# Patient Record
Sex: Male | Born: 1950 | Race: White | Hispanic: No | Marital: Married | Smoking: Never smoker
Health system: Southern US, Community
[De-identification: ages and names within clinical notes are randomized; demographics above are authoritative.]

## PROBLEM LIST (undated history)

## (undated) DIAGNOSIS — E119 Type 2 diabetes mellitus without complications: Secondary | ICD-10-CM

## (undated) DIAGNOSIS — T7840XA Allergy, unspecified, initial encounter: Secondary | ICD-10-CM

## (undated) DIAGNOSIS — Z9889 Other specified postprocedural states: Secondary | ICD-10-CM

## (undated) DIAGNOSIS — R112 Nausea with vomiting, unspecified: Secondary | ICD-10-CM

## (undated) DIAGNOSIS — G709 Myoneural disorder, unspecified: Secondary | ICD-10-CM

## (undated) DIAGNOSIS — H409 Unspecified glaucoma: Secondary | ICD-10-CM

## (undated) HISTORY — PX: CERVICAL SPINE SURGERY: SHX589

## (undated) HISTORY — PX: BACK SURGERY: SHX140

## (undated) HISTORY — PX: EYE SURGERY: SHX253

## (undated) HISTORY — DX: Allergy, unspecified, initial encounter: T78.40XA

## (undated) HISTORY — DX: Type 2 diabetes mellitus without complications: E11.9

## (undated) HISTORY — PX: SHOULDER SURGERY: SHX246

---

## 2006-10-12 ENCOUNTER — Encounter
Admission: RE | Admit: 2006-10-12 | Discharge: 2006-10-12 | Payer: Self-pay | Admitting: Physical Medicine and Rehabilitation

## 2008-08-05 ENCOUNTER — Encounter: Admission: RE | Admit: 2008-08-05 | Discharge: 2008-08-05 | Payer: Self-pay | Admitting: Neurosurgery

## 2009-07-30 ENCOUNTER — Encounter: Admission: RE | Admit: 2009-07-30 | Discharge: 2009-07-30 | Payer: Self-pay | Admitting: Neurosurgery

## 2009-08-24 ENCOUNTER — Ambulatory Visit (HOSPITAL_COMMUNITY): Admission: RE | Admit: 2009-08-24 | Discharge: 2009-08-25 | Payer: Self-pay | Admitting: Neurosurgery

## 2009-10-05 ENCOUNTER — Ambulatory Visit (HOSPITAL_COMMUNITY): Admission: RE | Admit: 2009-10-05 | Discharge: 2009-10-06 | Payer: Self-pay | Admitting: Neurosurgery

## 2010-09-12 LAB — CBC
HCT: 42.7 % (ref 39.0–52.0)
Hemoglobin: 15.1 g/dL (ref 13.0–17.0)
MCV: 95.6 fL (ref 78.0–100.0)
RDW: 13.2 % (ref 11.5–15.5)

## 2010-09-12 LAB — SURGICAL PCR SCREEN: MRSA, PCR: NEGATIVE

## 2010-09-13 LAB — CBC
Hemoglobin: 15.7 g/dL (ref 13.0–17.0)
WBC: 4.6 10*3/uL (ref 4.0–10.5)

## 2010-09-13 LAB — SURGICAL PCR SCREEN
MRSA, PCR: NEGATIVE
Staphylococcus aureus: POSITIVE — AB

## 2010-12-31 ENCOUNTER — Other Ambulatory Visit: Payer: Self-pay | Admitting: Neurosurgery

## 2010-12-31 DIAGNOSIS — M4800 Spinal stenosis, site unspecified: Secondary | ICD-10-CM

## 2011-01-11 ENCOUNTER — Ambulatory Visit
Admission: RE | Admit: 2011-01-11 | Discharge: 2011-01-11 | Disposition: A | Discharge status: Home / Self Care | Payer: 59 | Source: Ambulatory Visit | Attending: Neurosurgery | Admitting: Neurosurgery

## 2011-01-11 DIAGNOSIS — M4800 Spinal stenosis, site unspecified: Secondary | ICD-10-CM

## 2011-01-11 MED ORDER — GADOBENATE DIMEGLUMINE 529 MG/ML IV SOLN
20.0000 mL | Freq: Once | INTRAVENOUS | Status: AC | PRN
  Administered 2011-01-11: 20 mL via INTRAVENOUS

## 2011-06-25 HISTORY — PX: BACK SURGERY: SHX140

## 2011-10-07 ENCOUNTER — Other Ambulatory Visit: Payer: Self-pay | Admitting: Neurosurgery

## 2011-10-07 DIAGNOSIS — M545 Low back pain: Secondary | ICD-10-CM

## 2011-10-14 ENCOUNTER — Ambulatory Visit
Admission: RE | Admit: 2011-10-14 | Discharge: 2011-10-14 | Disposition: A | Discharge status: Home / Self Care | Payer: 59 | Source: Ambulatory Visit | Attending: Neurosurgery | Admitting: Neurosurgery

## 2011-10-14 DIAGNOSIS — M545 Low back pain: Secondary | ICD-10-CM

## 2011-10-14 MED ORDER — GADOBENATE DIMEGLUMINE 529 MG/ML IV SOLN
20.0000 mL | Freq: Once | INTRAVENOUS | Status: AC | PRN
  Administered 2011-10-14: 20 mL via INTRAVENOUS

## 2012-08-26 ENCOUNTER — Other Ambulatory Visit: Payer: Self-pay | Admitting: Orthopaedic Surgery

## 2012-08-26 DIAGNOSIS — M549 Dorsalgia, unspecified: Secondary | ICD-10-CM

## 2012-09-01 ENCOUNTER — Other Ambulatory Visit: Payer: Self-pay | Admitting: Neurosurgery

## 2012-09-04 ENCOUNTER — Other Ambulatory Visit: Payer: 59

## 2012-09-04 ENCOUNTER — Ambulatory Visit
Admission: RE | Admit: 2012-09-04 | Discharge: 2012-09-04 | Disposition: A | Discharge status: Home / Self Care | Payer: 59 | Source: Ambulatory Visit | Attending: Neurosurgery | Admitting: Neurosurgery

## 2012-09-04 MED ORDER — GADOBENATE DIMEGLUMINE 529 MG/ML IV SOLN
20.0000 mL | Freq: Once | INTRAVENOUS | Status: AC | PRN
  Administered 2012-09-04: 20 mL via INTRAVENOUS

## 2012-10-05 ENCOUNTER — Other Ambulatory Visit: Payer: Self-pay | Admitting: Neurosurgery

## 2012-10-12 ENCOUNTER — Encounter (HOSPITAL_COMMUNITY): Payer: Self-pay | Admitting: Pharmacy Technician

## 2012-10-14 NOTE — Pre-Procedure Instructions (Signed)
Erik Fletcher  10/14/2012   Your procedure is scheduled on:  May 1st, Thursday  Report to [facility] at  10:45 AM.             (This arrival time is per your surgeon's request)              (Take [facility] to the 3rd Floor)  Call this number if you have problems the morning of surgery: [phone]   Remember:   Do not eat food or drink liquids after midnight Wednesday.   Take these medicines the morning of surgery with A SIP OF WATER: eye drops   Do not wear jewelry, no wedding bands  Do not wear lotions, powders, or colognes. You may NOT wear deodorant.   Men may shave face and neck.   Do not bring valuables to the hospital.  Contacts, dentures or bridgework may not be worn into surgery.  Leave suitcase in the car. After surgery it may be brought to your room.  For patients admitted to the hospital, checkout time is 11:00 AM the day of discharge.   Name and phone number of your driver:    Special Instructions: Shower using CHG 2 nights before surgery and the night before surgery.  If you shower the day of surgery use CHG.  Use special wash - you have one bottle of CHG for all showers.  You should use approximately 1/3 of the bottle for each shower.   Please read over the following fact sheets that you were given: Pain Booklet, Blood Transfusion Information, MRSA Information and Surgical Site Infection Prevention

## 2012-10-15 ENCOUNTER — Encounter (HOSPITAL_COMMUNITY): Payer: Self-pay

## 2012-10-15 ENCOUNTER — Encounter (HOSPITAL_COMMUNITY)
Admission: RE | Admit: 2012-10-15 | Discharge: 2012-10-15 | Disposition: A | Discharge status: Home / Self Care | Payer: 59 | Source: Ambulatory Visit | Attending: Neurosurgery | Admitting: Neurosurgery

## 2012-10-15 HISTORY — DX: Other specified postprocedural states: Z98.890

## 2012-10-15 HISTORY — DX: Other specified postprocedural states: R11.2

## 2012-10-15 HISTORY — DX: Myoneural disorder, unspecified: G70.9

## 2012-10-15 HISTORY — DX: Unspecified glaucoma: H40.9

## 2012-10-15 LAB — BASIC METABOLIC PANEL
BUN: 24 mg/dL — ABNORMAL HIGH (ref 6–23)
CO2: 27 mEq/L (ref 19–32)
Calcium: 10 mg/dL (ref 8.4–10.5)
Creatinine, Ser: 0.94 mg/dL (ref 0.50–1.35)
Glucose, Bld: 187 mg/dL — ABNORMAL HIGH (ref 70–99)
Sodium: 142 mEq/L (ref 135–145)

## 2012-10-15 LAB — ABO/RH: ABO/RH(D): O POS

## 2012-10-15 LAB — CBC
HCT: 41.8 % (ref 39.0–52.0)
Hemoglobin: 14.7 g/dL (ref 13.0–17.0)
MCH: 31.1 pg (ref 26.0–34.0)
MCV: 88.4 fL (ref 78.0–100.0)
RBC: 4.73 MIL/uL (ref 4.22–5.81)

## 2012-10-15 LAB — TYPE AND SCREEN: Antibody Screen: NEGATIVE

## 2012-10-15 LAB — SURGICAL PCR SCREEN: Staphylococcus aureus: POSITIVE — AB

## 2012-10-15 NOTE — Progress Notes (Signed)
10/15/12 0827  OBSTRUCTIVE SLEEP APNEA  Have you ever been diagnosed with sleep apnea through a sleep study? No  Do you snore loudly (loud enough to be heard through closed doors)?  1  Do you often feel tired, fatigued, or sleepy during the daytime? 0  Has anyone observed you stop breathing during your sleep? 0  Do you have, or are you being treated for high blood pressure? 0  BMI more than 35 kg/m2? 1  Age over 64 years old? 1  Neck circumference greater than 40 cm/18 inches? 0  Gender: 1  Obstructive Sleep Apnea Score 4  Score 4 or greater  Results sent to PCP

## 2012-10-15 NOTE — Progress Notes (Signed)
10/15/12 0835  OBSTRUCTIVE SLEEP APNEA  Have you ever been diagnosed with sleep apnea through a sleep study? No  Do you snore loudly (loud enough to be heard through closed doors)?  1  Do you often feel tired, fatigued, or sleepy during the daytime? 0  Has anyone observed you stop breathing during your sleep? 0  Do you have, or are you being treated for high blood pressure? 0  BMI more than 35 kg/m2? 1  Age over 64 years old? 1  Neck circumference greater than 40 cm/18 inches? 0  Gender: 1  Obstructive Sleep Apnea Score 4  Score 4 or greater  No PCP

## 2012-10-21 MED ORDER — CEFAZOLIN SODIUM-DEXTROSE 2-3 GM-% IV SOLR
2.0000 g | INTRAVENOUS | Status: AC
Start: 2012-10-22 — End: 2012-10-22
  Administered 2012-10-22: 2 g via INTRAVENOUS
  Filled 2012-10-21: qty 50

## 2012-10-21 NOTE — H&P (Signed)
OUTPATIENT OFFICE NOTE  October 01, 2012  Patient Name:  Erik Fletcher   Date of Birth:  [birth year]  MR Number:  [id number]          HOPI:      Erik Fletcher returns today.  He has been quite uncomfortable in terms of his back and bilateral lower extremity pain.  He went to see an orthopedic surgeon to get an MRI done because he had trouble hearing back from [place].  He says that standing or walking greater than five minutes causes severe pain.  He went to see Dr. Ollen Bowl and did get prescriptions for pain medication.  Was taking Valium 10 mg, 5 mg Percocet but he says these are not helping  him at all recently.  He has been using Ibuprofen 200 mg q.h.s.    DATA:    His MRI shows marked worsening of the L4-5 spondylolisthesis and spinal stenosis with disc herniation and severe canal compromise.  He has severe recurrent spinal stenosis at the L4-5 level.    IMPRESSION/PLAN:  Based on my review of his imaging and his significant physical findings of weakness in bilateral EHL and severe back and leg pain, I recommend we go ahead with surgery.  This would consist of redo laminectomy L4-5 with fusion at the L4-5 level, and this will be scheduled for 10/22/2012.  Risks and benefits were discussed in detail with the patient and he wishes to proceed.  He was fitted for a LSO brace.     Danae Orleans. Venetia Maxon, MD    Elissa Lovett, [place].  #[id number]  DOB:  [birth year]    12/25/2011:  Erik Fletcher returns today.  He says he is the same.  He has good days.  He complains of pain into both his buttocks.  He is currently taking ibuprofen 200 mg 4 tablets prior to activity and Percogesic, as well as Percocet and Valium per Dr. Ollen Bowl on a rare basis.    I reviewed his imaging studies with him and we discussed treatment options.  He asked multiple questions about long-term prognosis and future surgeries and whether or not he would be able to ride his motorcycle.  He does have spondylolisthesis of L4 on L5 with a  left greater than right-sided disc protrusion at L4-5.  He has some mild degenerative changes at L3-4 and also noticed that he had a disc protrusion at T12 and L1 level.    He wants to wait until the fall to go ahead with surgery.  I told him we would plan on scheduling that based on his convenience.  I would like to get a repeat MRI of his lumbar spine to make sure that things have not changed significantly before embarking on surgery.  As of currently, this would consist of posterior decompression and fusion at the L5-S1 level.          Danae Orleans. Venetia Maxon, M.D./sv     Elissa Lovett, [place] Hag[place].  #[id number] DOB:  30-Aug-1950 11/04/2011:  Erik Fletcher returns today to review an MRI of the lumbar spine which shows that he has postoperative changes at L4-5 with a  recurrent left paracentral to posterolateral moderate sized disc protrusion, with left greater than right lateral recess stenosis, and moderate central stenosis, and mild left foraminal stenosis.  This protrusion has significantly increased in size.  While it was described as a recurrent protrusion, he had not had a discectomy, but only a laminectomy for spinal stenosis.    Given  the significant degenerative changes of previous bilateral laminectomy and large disc herniation, I have recommended that we pursue surgical intervention.  This would consist of a re-do decompression and fusion at the L4-5 level. He is quite miserable with his level of discomfort and wishes to proceed with surgical intervention.    Erik Fletcher has been advised to proceed with L4-5 re-do laminectomy and fusion.  He wants to see how he does over the next couple months and is going to come back and see me in two months, but he may elect to pursue surgery if he continues to have significant and unrelenting pain.  He will need to be fitted for a lumbosacral orthosis.           Danae Orleans. Venetia Maxon, M.D./aft      NEUROSURGICAL CONSULTATION   Erik Fletcher, Erik Fletcher.               DOB:  [birth year]          #[id number]                          July 27, 2008   HISTORY:                                                     Erik Fletcher is a 62 year old man who presents by self-referral who works for the [employer], who presents with a chief complaint of neck and low back pain. He describes neck pain ranging from 5 to 9/10 in severity and also with frequent headaches, and also low back pain ranging from 0 to 9/10 in severity. He notes bilateral hip pain and also pain into his midback ranging from 5 to 8/10 in severity.  He notes numbness in his left leg and left foot occasionally, into both of his calves sometimes with cramping.  He has noted increased signs and symptoms recently.  He is felt to have lumbar spinal stenosis by an MRI of two years ago and also has had back and neck pain since the 1980s.  He takes up to 1600 mg. of Ibuprofen daily.  He has used steroid pack which helped him in 2008. He says he has been exercising daily, but has had increased pain recently.  The only other medical problem is glaucoma.  He has had prior shoulder surgery in 1983.  He says that currently his biggest problem relates to his low back.  He was told that he had severe stenosis at L4-5 based on an MRI from [facility] on 10/12/2006.  He says gets back pain with standing and he gets better with sitting.    I previously saw Erik Fletcher in 2005 essentially for neck problems at that time and I recommended conservative management.  He says that his pain has worsened since that time.    REVIEW OF SYSTEMS:                              A detailed Review of Systems sheet was reviewed with the patient.  Pertinent positives include glaucoma, leg pain while walking, back pain, arm pain, leg pain, and neck pain.  All other systems are negative; this includes Constitutional symptoms, Ears, nose, mouth, throat, Endocrine, Respiratory, Gastrointestinal, Genitourinary, Integumentary &  Breast,  Neurologic, Psychiatric, Hematologic/Lymphatic, Allergic/Immunologic.    PAST MEDICAL HISTORY:                           Current Medical Conditions:                    As previously described.      Prior Operations and Hospitalizations:     He has had shoulder surgery by Dr. Chaney Malling in 1983.      Medications and Allergies:                       He has no known drug allergies.  Current medications are Resistal drops twice daily for dry eye and additional drops twice daily for glaucoma.      Height and Weight:                                  He is 5'9" tall, 245 lbs.    FAMILY HISTORY:                                     Both parents deceased. There is a family history of high blood pressure and heart disease.    SOCIAL HISTORY:                                     He is a nonsmoker, nondrinker, and no history of substance abuse.    PHYSICAL EXAMINATION:                         General Appearance:                                 On examination today, Erik Fletcher is a pleasant and cooperative man in no acute distress.      Blood Pressure, Pulse:                              Blood pressure is 148/94. Heart rate is 80 and regular.      HEENT - normocephalic, atraumatic.  The pupils are equal, round and reactive to light.  The extraocular muscles are intact.  Sclerae - white.  Conjunctiva - pink.  Oropharynx benign.  Uvula midline.     Neck - there are no masses, meningismus, deformities, tracheal deviation, jugular vein distention or carotid bruits.  There is normal cervical range of motion.  Negative Spurlings' maneuver to either side.  Lhermitte's sign is not present with axial compression.  He has neck pain with movement and with extension.      Respiratory - there is normal respiratory effort with good intercostal function.  Lungs are clear to auscultation.  There are no rales, rhonchi or wheezes.      Cardiovascular - the heart has regular rate and rhythm to auscultation.  No murmurs  are appreciated.  There is no extremity edema, cyanosis or clubbing.  There are palpable pedal pulses.      Abdomen - soft, nontender, no hepatosplenomegaly appreciated or masses.  There are active bowel sounds.  No guarding or rebound.      Musculoskeletal Examination - he is able to stand on his heels and toes.  He is only able to bend to the level of his knees with his upper extremities outstretched.  Straight leg raise is positive for tightness in both of his legs and fairly limited range of motion, but without severe pain. He also has midback pain to palpation particularly in the midthoracic spine.    NEUROLOGICAL EXAMINATION:        The patient is oriented to time, person and place and has good recall of both recent and remote memory with normal attention span and concentration.  The patient speaks with clear and fluent speech and exhibits normal language function and appropriate fund of knowledge.      Cranial Nerve Examination - pupils are equal, round and reactive to light.  Extraocular movements are full.  Visual fields are full to confrontational testing.  Facial sensation and facial movement are symmetric and intact.  Hearing is intact to finger rub.  Palate is upgoing.  Shoulder shrug is symmetric.  Tongue protrudes in the midline.      Motor Examination - motor strength is 5/5 in the bilateral deltoids, biceps, triceps, handgrips, wrist extensors, interosseous.  In the lower extremities motor strength is 5/5 in hip flexion, extension, quadriceps, hamstrings, plantar flexion, dorsiflexion and extensor hallucis longus.      Sensory Examination - normal to light touch and pinprick sensation in the upper and lower extremities.     Deep Tendon Reflexes - 1 in the biceps, triceps, and brachioradialis, 1 in the knees, 1 in the ankles.  The great toes are downgoing to plantar stimulation.      Cerebellar Examination - normal coordination in upper and lower extremities and normal rapid  alternating movements.  Romberg test is negative.    IMPRESSION AND RECOMMENDATIONS:      Erik Fletcher is a 62 year old man with neck, low back, and midback pain. I have recommended that he undergo MRI of his cervical, thoracic, and lumbar spine to better assess the severity of degenerative changes. I will make further recommendations after he has those studies performed.    [facility]    Danae Orleans. Venetia Maxon, M.D.

## 2012-10-22 ENCOUNTER — Inpatient Hospital Stay (HOSPITAL_COMMUNITY): Payer: 59

## 2012-10-22 ENCOUNTER — Encounter (HOSPITAL_COMMUNITY): Payer: Self-pay

## 2012-10-22 ENCOUNTER — Encounter (HOSPITAL_COMMUNITY)
Admission: RE | Disposition: A | Discharge status: Home / Self Care | Payer: Self-pay | Source: Ambulatory Visit | Attending: Neurosurgery

## 2012-10-22 ENCOUNTER — Encounter (HOSPITAL_COMMUNITY): Payer: Self-pay | Admitting: *Deleted

## 2012-10-22 ENCOUNTER — Inpatient Hospital Stay (HOSPITAL_COMMUNITY): Payer: 59 | Admitting: *Deleted

## 2012-10-22 ENCOUNTER — Inpatient Hospital Stay (HOSPITAL_COMMUNITY)
Admission: RE | Admit: 2012-10-22 | Discharge: 2012-10-24 | DRG: 460 | Disposition: A | Discharge status: Home / Self Care | Payer: 59 | Source: Ambulatory Visit | Attending: Neurosurgery | Admitting: Neurosurgery

## 2012-10-22 DIAGNOSIS — M47817 Spondylosis without myelopathy or radiculopathy, lumbosacral region: Principal | ICD-10-CM | POA: Diagnosis present

## 2012-10-22 DIAGNOSIS — M5126 Other intervertebral disc displacement, lumbar region: Secondary | ICD-10-CM | POA: Diagnosis present

## 2012-10-22 DIAGNOSIS — M48061 Spinal stenosis, lumbar region without neurogenic claudication: Secondary | ICD-10-CM

## 2012-10-22 DIAGNOSIS — M5124 Other intervertebral disc displacement, thoracic region: Secondary | ICD-10-CM | POA: Diagnosis present

## 2012-10-22 DIAGNOSIS — H409 Unspecified glaucoma: Secondary | ICD-10-CM | POA: Diagnosis present

## 2012-10-22 SURGERY — POSTERIOR LUMBAR FUSION 1 LEVEL
Anesthesia: General | Site: Back | Wound class: Clean

## 2012-10-22 MED ORDER — SODIUM CHLORIDE 0.9 % IJ SOLN
3.0000 mL | Freq: Two times a day (BID) | INTRAMUSCULAR | Status: DC
  Administered 2012-10-22 – 2012-10-23 (×2): 3 mL via INTRAVENOUS

## 2012-10-22 MED ORDER — EPHEDRINE SULFATE 50 MG/ML IJ SOLN
INTRAMUSCULAR | Status: DC | PRN
  Administered 2012-10-22: 10 mg via INTRAVENOUS

## 2012-10-22 MED ORDER — MEPERIDINE HCL 25 MG/ML IJ SOLN: 6.2500 mg | INTRAMUSCULAR | Status: DC | PRN

## 2012-10-22 MED ORDER — MORPHINE SULFATE (PF) 1 MG/ML IV SOLN
INTRAVENOUS | Status: AC
  Administered 2012-10-22: 19:00:00
  Filled 2012-10-22: qty 25

## 2012-10-22 MED ORDER — HYDROCODONE-ACETAMINOPHEN 5-325 MG PO TABS: 1.0000 | ORAL_TABLET | ORAL | Status: DC | PRN

## 2012-10-22 MED ORDER — DEXAMETHASONE SODIUM PHOSPHATE 4 MG/ML IJ SOLN
INTRAMUSCULAR | Status: DC | PRN
  Administered 2012-10-22: 8 mg via INTRAVENOUS

## 2012-10-22 MED ORDER — OXYCODONE HCL 5 MG PO TABS: 5.0000 mg | ORAL_TABLET | Freq: Once | ORAL | Status: DC | PRN

## 2012-10-22 MED ORDER — 0.9 % SODIUM CHLORIDE (POUR BTL) OPTIME
TOPICAL | Status: DC | PRN
  Administered 2012-10-22: 1000 mL

## 2012-10-22 MED ORDER — HYDROMORPHONE HCL PF 1 MG/ML IJ SOLN
INTRAMUSCULAR | Status: AC
  Administered 2012-10-22: 0.5 mg via INTRAVENOUS
  Filled 2012-10-22: qty 1

## 2012-10-22 MED ORDER — KCL IN DEXTROSE-NACL 20-5-0.45 MEQ/L-%-% IV SOLN
INTRAVENOUS | Status: DC
  Administered 2012-10-22: via INTRAVENOUS
  Filled 2012-10-22 (×4): qty 1000

## 2012-10-22 MED ORDER — THROMBIN 20000 UNITS EX SOLR
CUTANEOUS | Status: DC | PRN
  Administered 2012-10-22: 16:00:00 via TOPICAL

## 2012-10-22 MED ORDER — PROPOFOL 10 MG/ML IV BOLUS
INTRAVENOUS | Status: DC | PRN
  Administered 2012-10-22: 200 mg via INTRAVENOUS

## 2012-10-22 MED ORDER — METHOCARBAMOL 500 MG PO TABS: 500.0000 mg | ORAL_TABLET | Freq: Four times a day (QID) | ORAL | Status: DC | PRN

## 2012-10-22 MED ORDER — ONDANSETRON HCL 4 MG/2ML IJ SOLN
4.0000 mg | Freq: Four times a day (QID) | INTRAMUSCULAR | Status: DC | PRN
  Filled 2012-10-22: qty 2

## 2012-10-22 MED ORDER — DOCUSATE SODIUM 100 MG PO CAPS
100.0000 mg | ORAL_CAPSULE | Freq: Two times a day (BID) | ORAL | Status: DC
  Administered 2012-10-22 – 2012-10-24 (×4): 100 mg via ORAL
  Filled 2012-10-22 (×3): qty 1

## 2012-10-22 MED ORDER — ONDANSETRON HCL 4 MG/2ML IJ SOLN: 4.0000 mg | INTRAMUSCULAR | Status: DC | PRN

## 2012-10-22 MED ORDER — OXYCODONE HCL 5 MG/5ML PO SOLN: 5.0000 mg | Freq: Once | ORAL | Status: DC | PRN

## 2012-10-22 MED ORDER — ACETAMINOPHEN 10 MG/ML IV SOLN
1000.0000 mg | Freq: Once | INTRAVENOUS | Status: DC
  Filled 2012-10-22: qty 100

## 2012-10-22 MED ORDER — BUPIVACAINE HCL (PF) 0.5 % IJ SOLN
INTRAMUSCULAR | Status: DC | PRN
  Administered 2012-10-22: 5 mL

## 2012-10-22 MED ORDER — MIDAZOLAM HCL 5 MG/5ML IJ SOLN
INTRAMUSCULAR | Status: DC | PRN
  Administered 2012-10-22: 2 mg via INTRAVENOUS

## 2012-10-22 MED ORDER — LATANOPROST 0.005 % OP SOLN
1.0000 [drp] | Freq: Every day | OPHTHALMIC | Status: DC
  Administered 2012-10-23: 1 [drp] via OPHTHALMIC
  Filled 2012-10-22: qty 2.5

## 2012-10-22 MED ORDER — DIAZEPAM 5 MG PO TABS: 10.0000 mg | ORAL_TABLET | Freq: Every day | ORAL | Status: DC | PRN

## 2012-10-22 MED ORDER — ACETAMINOPHEN 325 MG PO TABS: 650.0000 mg | ORAL_TABLET | ORAL | Status: DC | PRN

## 2012-10-22 MED ORDER — OXYCODONE-ACETAMINOPHEN 5-325 MG PO TABS
1.0000 | ORAL_TABLET | ORAL | Status: DC | PRN
  Administered 2012-10-23: 2 via ORAL
  Administered 2012-10-23: 1 via ORAL
  Filled 2012-10-22 (×2): qty 2

## 2012-10-22 MED ORDER — VECURONIUM BROMIDE 10 MG IV SOLR
INTRAVENOUS | Status: DC | PRN
  Administered 2012-10-22: 2 mg via INTRAVENOUS

## 2012-10-22 MED ORDER — CEFAZOLIN SODIUM 1-5 GM-% IV SOLN
1.0000 g | Freq: Three times a day (TID) | INTRAVENOUS | Status: AC
  Administered 2012-10-22 – 2012-10-23 (×2): 1 g via INTRAVENOUS
  Filled 2012-10-22 (×4): qty 50

## 2012-10-22 MED ORDER — DIPHENHYDRAMINE HCL 12.5 MG/5ML PO ELIX
12.5000 mg | ORAL_SOLUTION | Freq: Four times a day (QID) | ORAL | Status: DC | PRN
  Filled 2012-10-22: qty 5

## 2012-10-22 MED ORDER — FLEET ENEMA 7-19 GM/118ML RE ENEM: 1.0000 | ENEMA | Freq: Once | RECTAL | Status: AC | PRN

## 2012-10-22 MED ORDER — ZOLPIDEM TARTRATE 5 MG PO TABS: 5.0000 mg | ORAL_TABLET | Freq: Every evening | ORAL | Status: DC | PRN

## 2012-10-22 MED ORDER — FENTANYL CITRATE 0.05 MG/ML IJ SOLN
INTRAMUSCULAR | Status: DC | PRN
  Administered 2012-10-22 (×3): 50 ug via INTRAVENOUS
  Administered 2012-10-22: 100 ug via INTRAVENOUS
  Administered 2012-10-22: 50 ug via INTRAVENOUS
  Administered 2012-10-22: 100 ug via INTRAVENOUS
  Administered 2012-10-22 (×2): 50 ug via INTRAVENOUS

## 2012-10-22 MED ORDER — ONDANSETRON HCL 4 MG/2ML IJ SOLN
INTRAMUSCULAR | Status: DC | PRN
  Administered 2012-10-22 (×2): 4 mg via INTRAVENOUS

## 2012-10-22 MED ORDER — METHOCARBAMOL 500 MG PO TABS
ORAL_TABLET | ORAL | Status: AC
  Administered 2012-10-22: 500 mg
  Filled 2012-10-22: qty 1

## 2012-10-22 MED ORDER — SODIUM CHLORIDE 0.9 % IJ SOLN: 9.0000 mL | INTRAMUSCULAR | Status: DC | PRN

## 2012-10-22 MED ORDER — PROMETHAZINE HCL 25 MG/ML IJ SOLN: 6.2500 mg | INTRAMUSCULAR | Status: DC | PRN

## 2012-10-22 MED ORDER — POLYETHYLENE GLYCOL 3350 17 G PO PACK
17.0000 g | PACK | Freq: Every day | ORAL | Status: DC | PRN
  Filled 2012-10-22: qty 1

## 2012-10-22 MED ORDER — OXYCODONE-ACETAMINOPHEN 5-325 MG PO TABS: 1.0000 | ORAL_TABLET | ORAL | Status: DC | PRN

## 2012-10-22 MED ORDER — METHOCARBAMOL 100 MG/ML IJ SOLN
500.0000 mg | Freq: Four times a day (QID) | INTRAVENOUS | Status: DC | PRN
  Filled 2012-10-22: qty 5

## 2012-10-22 MED ORDER — MORPHINE SULFATE (PF) 1 MG/ML IV SOLN: INTRAVENOUS | Status: DC

## 2012-10-22 MED ORDER — ROCURONIUM BROMIDE 100 MG/10ML IV SOLN
INTRAVENOUS | Status: DC | PRN
  Administered 2012-10-22: 50 mg via INTRAVENOUS

## 2012-10-22 MED ORDER — SENNA 8.6 MG PO TABS
1.0000 | ORAL_TABLET | Freq: Two times a day (BID) | ORAL | Status: DC
  Administered 2012-10-22 – 2012-10-24 (×4): 8.6 mg via ORAL
  Filled 2012-10-22 (×5): qty 1

## 2012-10-22 MED ORDER — NALOXONE HCL 0.4 MG/ML IJ SOLN
0.4000 mg | INTRAMUSCULAR | Status: DC | PRN
  Filled 2012-10-22: qty 1

## 2012-10-22 MED ORDER — LACTATED RINGERS IV SOLN
INTRAVENOUS | Status: DC | PRN
  Administered 2012-10-22 (×3): via INTRAVENOUS

## 2012-10-22 MED ORDER — LIDOCAINE HCL (CARDIAC) 20 MG/ML IV SOLN
INTRAVENOUS | Status: DC | PRN
  Administered 2012-10-22: 40 mg via INTRAVENOUS

## 2012-10-22 MED ORDER — PHENOL 1.4 % MT LIQD: 1.0000 | OROMUCOSAL | Status: DC | PRN

## 2012-10-22 MED ORDER — LIDOCAINE-EPINEPHRINE 1 %-1:100000 IJ SOLN
INTRAMUSCULAR | Status: DC | PRN
  Administered 2012-10-22: 5 mL via INTRADERMAL

## 2012-10-22 MED ORDER — SODIUM CHLORIDE 0.9 % IJ SOLN: 3.0000 mL | INTRAMUSCULAR | Status: DC | PRN

## 2012-10-22 MED ORDER — NEOSTIGMINE METHYLSULFATE 1 MG/ML IJ SOLN
INTRAMUSCULAR | Status: DC | PRN
  Administered 2012-10-22: 5 mg via INTRAVENOUS

## 2012-10-22 MED ORDER — MIDAZOLAM HCL 2 MG/2ML IJ SOLN: 0.5000 mg | Freq: Once | INTRAMUSCULAR | Status: DC | PRN

## 2012-10-22 MED ORDER — HYDROMORPHONE HCL PF 1 MG/ML IJ SOLN
0.2500 mg | INTRAMUSCULAR | Status: DC | PRN
  Administered 2012-10-22: 0.5 mg via INTRAVENOUS

## 2012-10-22 MED ORDER — SODIUM CHLORIDE 0.9 % IV SOLN: 250.0000 mL | INTRAVENOUS | Status: DC

## 2012-10-22 MED ORDER — MENTHOL 3 MG MT LOZG: 1.0000 | LOZENGE | OROMUCOSAL | Status: DC | PRN

## 2012-10-22 MED ORDER — GLYCOPYRROLATE 0.2 MG/ML IJ SOLN
INTRAMUSCULAR | Status: DC | PRN
  Administered 2012-10-22: 0.6 mg via INTRAVENOUS

## 2012-10-22 MED ORDER — BISACODYL 10 MG RE SUPP: 10.0000 mg | Freq: Every day | RECTAL | Status: DC | PRN

## 2012-10-22 MED ORDER — ACETAMINOPHEN 650 MG RE SUPP: 650.0000 mg | RECTAL | Status: DC | PRN

## 2012-10-22 MED ORDER — DIPHENHYDRAMINE HCL 50 MG/ML IJ SOLN
12.5000 mg | Freq: Four times a day (QID) | INTRAMUSCULAR | Status: DC | PRN
  Filled 2012-10-22: qty 0.25

## 2012-10-22 SURGICAL SUPPLY — 80 items
ADH SKN CLS APL DERMABOND .7 (GAUZE/BANDAGES/DRESSINGS) ×1
APL SKNCLS STERI-STRIP NONHPOA (GAUZE/BANDAGES/DRESSINGS) ×1
BAG DECANTER FOR FLEXI CONT (MISCELLANEOUS) ×2 IMPLANT
BENZOIN TINCTURE PRP APPL 2/3 (GAUZE/BANDAGES/DRESSINGS) ×2 IMPLANT
BLADE SURG ROTATE 9660 (MISCELLANEOUS) ×1 IMPLANT
BONE VOID FILLER STRIP 10CC (Bone Implant) ×1 IMPLANT
BUR MATCHSTICK NEURO 3.0 LAGG (BURR) ×2 IMPLANT
BUR PRECISION FLUTE 5.0 (BURR) ×2 IMPLANT
CAGE 11MM (Cage) ×2 IMPLANT
CANISTER SUCTION 2500CC (MISCELLANEOUS) ×2 IMPLANT
CLOTH BEACON ORANGE TIMEOUT ST (SAFETY) ×2 IMPLANT
CONT SPEC 4OZ CLIKSEAL STRL BL (MISCELLANEOUS) ×4 IMPLANT
COVER BACK TABLE 24X17X13 BIG (DRAPES) IMPLANT
COVER TABLE BACK 60X90 (DRAPES) ×2 IMPLANT
DERMABOND ADVANCED (GAUZE/BANDAGES/DRESSINGS) ×1
DERMABOND ADVANCED .7 DNX12 (GAUZE/BANDAGES/DRESSINGS) ×1 IMPLANT
DRAPE C-ARM 42X72 X-RAY (DRAPES) ×4 IMPLANT
DRAPE LAPAROTOMY 100X72X124 (DRAPES) ×2 IMPLANT
DRAPE POUCH INSTRU U-SHP 10X18 (DRAPES) ×2 IMPLANT
DRAPE SURG 17X23 STRL (DRAPES) ×2 IMPLANT
DRESSING TELFA 8X3 (GAUZE/BANDAGES/DRESSINGS) ×2 IMPLANT
DURAPREP 26ML APPLICATOR (WOUND CARE) ×2 IMPLANT
ELECT BLADE 4.0 EZ CLEAN MEGAD (MISCELLANEOUS) ×2
ELECT REM PT RETURN 9FT ADLT (ELECTROSURGICAL) ×2
ELECTRODE BLDE 4.0 EZ CLN MEGD (MISCELLANEOUS) IMPLANT
ELECTRODE REM PT RTRN 9FT ADLT (ELECTROSURGICAL) ×1 IMPLANT
EVACUATOR 1/8 PVC DRAIN (DRAIN) ×1 IMPLANT
GAUZE SPONGE 4X4 16PLY XRAY LF (GAUZE/BANDAGES/DRESSINGS) IMPLANT
GLOVE BIO SURGEON STRL SZ8 (GLOVE) ×4 IMPLANT
GLOVE BIOGEL PI IND STRL 7.5 (GLOVE) IMPLANT
GLOVE BIOGEL PI IND STRL 8 (GLOVE) ×2 IMPLANT
GLOVE BIOGEL PI IND STRL 8.5 (GLOVE) ×2 IMPLANT
GLOVE BIOGEL PI INDICATOR 7.5 (GLOVE) ×1
GLOVE BIOGEL PI INDICATOR 8 (GLOVE) ×2
GLOVE BIOGEL PI INDICATOR 8.5 (GLOVE) ×2
GLOVE ECLIPSE 7.5 STRL STRAW (GLOVE) ×4 IMPLANT
GLOVE ECLIPSE 8.0 STRL XLNG CF (GLOVE) ×4 IMPLANT
GLOVE EXAM NITRILE LRG STRL (GLOVE) IMPLANT
GLOVE EXAM NITRILE MD LF STRL (GLOVE) ×1 IMPLANT
GLOVE EXAM NITRILE XL STR (GLOVE) IMPLANT
GLOVE EXAM NITRILE XS STR PU (GLOVE) IMPLANT
GOWN BRE IMP SLV AUR LG STRL (GOWN DISPOSABLE) ×1 IMPLANT
GOWN BRE IMP SLV AUR XL STRL (GOWN DISPOSABLE) ×4 IMPLANT
GOWN STRL REIN 2XL LVL4 (GOWN DISPOSABLE) ×4 IMPLANT
KIT BASIN OR (CUSTOM PROCEDURE TRAY) ×2 IMPLANT
KIT INFUSE SMALL (Orthopedic Implant) ×1 IMPLANT
KIT POSITION SURG JACKSON T1 (MISCELLANEOUS) ×2 IMPLANT
KIT ROOM TURNOVER OR (KITS) ×2 IMPLANT
MILL MEDIUM DISP (BLADE) ×1 IMPLANT
NDL HYPO 25X1 1.5 SAFETY (NEEDLE) ×1 IMPLANT
NDL SPNL 18GX3.5 QUINCKE PK (NEEDLE) IMPLANT
NEEDLE HYPO 25X1 1.5 SAFETY (NEEDLE) ×2 IMPLANT
NEEDLE SPNL 18GX3.5 QUINCKE PK (NEEDLE) IMPLANT
NS IRRIG 1000ML POUR BTL (IV SOLUTION) ×2 IMPLANT
PACK LAMINECTOMY NEURO (CUSTOM PROCEDURE TRAY) ×2 IMPLANT
PAD ARMBOARD 7.5X6 YLW CONV (MISCELLANEOUS) ×6 IMPLANT
PATTIES SURGICAL .5 X.5 (GAUZE/BANDAGES/DRESSINGS) IMPLANT
PATTIES SURGICAL .5 X1 (DISPOSABLE) IMPLANT
PATTIES SURGICAL 1X1 (DISPOSABLE) IMPLANT
SCREW 50MM (Screw) ×2 IMPLANT
SCREW POLYAX 6.5X45MM (Screw) ×2 IMPLANT
SCREW SET SPINAL STD HEXALOBE (Screw) ×4 IMPLANT
SPONGE GAUZE 4X4 12PLY (GAUZE/BANDAGES/DRESSINGS) ×2 IMPLANT
SPONGE LAP 4X18 X RAY DECT (DISPOSABLE) IMPLANT
SPONGE SURGIFOAM ABS GEL 100 (HEMOSTASIS) ×2 IMPLANT
STAPLER SKIN PROX WIDE 3.9 (STAPLE) IMPLANT
STRIP CLOSURE SKIN 1/2X4 (GAUZE/BANDAGES/DRESSINGS) ×2 IMPLANT
SUT VIC AB 1 CT1 18XBRD ANBCTR (SUTURE) ×2 IMPLANT
SUT VIC AB 1 CT1 8-18 (SUTURE) ×2
SUT VIC AB 2-0 CT1 18 (SUTURE) ×3 IMPLANT
SUT VIC AB 3-0 SH 8-18 (SUTURE) ×3 IMPLANT
SYR 20ML ECCENTRIC (SYRINGE) ×2 IMPLANT
SYR 3ML LL SCALE MARK (SYRINGE) ×4 IMPLANT
SYR 5ML LL (SYRINGE) ×1 IMPLANT
TAPE CLOTH SURG 4X10 WHT LF (GAUZE/BANDAGES/DRESSINGS) ×1 IMPLANT
TOWEL OR 17X24 6PK STRL BLUE (TOWEL DISPOSABLE) ×2 IMPLANT
TOWEL OR 17X26 10 PK STRL BLUE (TOWEL DISPOSABLE) ×2 IMPLANT
TRAP SPECIMEN MUCOUS 40CC (MISCELLANEOUS) ×2 IMPLANT
TRAY FOLEY CATH 14FRSI W/METER (CATHETERS) ×2 IMPLANT
WATER STERILE IRR 1000ML POUR (IV SOLUTION) ×2 IMPLANT

## 2012-10-22 NOTE — Interval H&P Note (Signed)
History and Physical Interval Note:  10/22/2012 6:42 AM  Erik Fletcher  has presented today for surgery, with the diagnosis of Spondylolisthesis, Lumbar stenosis, Lumbar hnp without myelopathy  The various methods of treatment have been discussed with the patient and family. After consideration of risks, benefits and other options for treatment, the patient has consented to  Procedure(s) with comments: POSTERIOR LUMBAR FUSION 1 LEVEL (N/A) - L4-5 Redo Laminectomy with Fusion as a surgical intervention .  The patient's history has been reviewed, patient examined, no change in status, stable for surgery.  I have reviewed the patient's chart and labs.  Questions were answered to the patient's satisfaction.     [name]

## 2012-10-22 NOTE — Progress Notes (Signed)
Awake, alert, conversant.  Full strength both lower extremities bilateral DF PF.  Doing well.

## 2012-10-22 NOTE — Progress Notes (Signed)
Full dose Morphine PCA begun & verified by Stephens Shire  RN

## 2012-10-22 NOTE — Brief Op Note (Signed)
10/22/2012  6:46 PM  PATIENT:  Erik Fletcher  62 y.o. 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, Lumbar radiculopathy L 45  PROCEDURE:  Procedure(s) with comments: POSTERIOR LUMBAR FUSION 1 LEVEL (N/A) - Lumbar Four-Five Redo Laminectomy with Fusion with PEEK interbody cages, Pedicle screw fixation, Posterolateral arthrodesis  SURGEON:  Surgeon(s) and Role:    * Maeola Harman, MD - Primary    * Clydene Fake, MD - Assisting  PHYSICIAN ASSISTANT:   ASSISTANTS: [place], RN   ANESTHESIA:   general  EBL:  Total I/O In: 2200 [I.V.:2200] Out: 850 [Urine:600; Blood:250]  BLOOD ADMINISTERED:none  DRAINS: (Medium) Hemovact drain(s) in the epidural space with  Suction Open   LOCAL MEDICATIONS USED:  MARCAINE     SPECIMEN:  No Specimen  DISPOSITION OF SPECIMEN:  N/A  COUNTS:  YES  TOURNIQUET:  * No tourniquets in log *  DICTATION: Patient is 62 year old man with mobile spondylolisthesis of L4 on L5 with recurrent lumbar stenosis. He has bilateral L5 radiculopathies. It was elected to take him to surgery for redo decompression and fusion at this level.   Procedure: Patient was placed in a prone position on the Gibbon table after smooth and uncomplicated induction of general endotracheal anesthesia. His low back was prepped and draped in usual sterile fashion with Betadine scrub and DuraPrep. Area of incision was infiltrated with local lidocaine. Incision was made to the lumbodorsal fascia was incised and exposure was performed of the L3 through L5 spinous processes laminae facet joint and transverse processes. Intraoperative x-ray was obtained which confirmed correct orientation. A total laminectomy of L4 had already been performed and the  scar tissue was carefully exposed and remaining bony elements defined.  The L4 facet joints were the disarticulated at this level and thorough decompression was performed of both L4 and L5 nerve roots along with the common dural tube, carefully dissecting through scar tissue along the previous decompression. This decompression was more involved than would be typical of that performed for PLIF alone and included painstaking dissection of adherent ligament compressing the thecal sac and wide decompression of all neural elements. A thorough discectomy was initially performed on the left with preparation of the endplates for grafting a trial spacer was placed this level and a thorough discectomy was performed on the right as well. Bone autograft was packed within the interspace bilaterally along with small BMP kit and NexOss bone graft extender. Bilateral medium 11 mm peek cages were packed with BMP and extender and was inserted the interspace and countersunk appropriately along with 8 cc of morselized bone autograft. The posterolateral region was extensively decorticated and pedicle probes were placed at L4 and L5 bilaterally. Intraoperative fluoroscopy confirmed correct orientationin the AP and lateral plane. 45 x 6.5 mm pedicle screws were placed at L5 bilaterally and 50 x 6.5 mm screws placed at L4 bilaterally final x-rays demonstrated well-positioned interbody grafts and pedicle screw fixation. A 40 mm lordotic rod was placed on the right and a 40 mm rod was placed on the left locked down in situ and the posterolateral region was packed with the remaining autograft and  bone graft extender on the right and bone graft extender alone on the left. The wound was irrigated and a medium Hemovac drain was placed in the epidural space. Fascia was closed with 1 Vicryl  sutures skin edges were reapproximated 2 and 3-0 Vicryl sutures. The wound is dressed with benzoin Steri-Strips Telfa gauze and tape the patient was extubated  in the operating room and taken to recovery in stable satisfactory condition. He tolerated the operation well counts were correct at the end of the case.   PLAN OF CARE: Admit to inpatient   PATIENT DISPOSITION:  PACU - hemodynamically stable.   Delay start of Pharmacological VTE agent (>24hrs) due to surgical blood loss or risk of bleeding: yes

## 2012-10-22 NOTE — Preoperative (Signed)
Beta Blockers   Reason not to administer Beta Blockers:Not Applicable 

## 2012-10-22 NOTE — Anesthesia Postprocedure Evaluation (Signed)
  Anesthesia Post-op Note  Patient: Erik Fletcher  Procedure(s) Performed: Procedure(s) with comments: POSTERIOR LUMBAR FUSION 1 LEVEL (N/A) - Lumbar Four-Five Redo Laminectomy with Fusion  Patient Location: PACU  Anesthesia Type:General  Level of Consciousness: awake and alert   Airway and Oxygen Therapy: Patient Spontanous Breathing and Patient connected to nasal cannula oxygen  Post-op Pain: mild  Post-op Assessment: Post-op Vital signs reviewed, Patient's Cardiovascular Status Stable, Respiratory Function Stable and Patent Airway  Post-op Vital Signs: Reviewed and stable  Complications: No apparent anesthesia complications

## 2012-10-22 NOTE — Anesthesia Procedure Notes (Signed)
Procedure Name: Intubation Date/Time: 10/22/2012 4:00 PM Performed by: Brien Mates DOBSON Pre-anesthesia Checklist: Patient identified, Emergency Drugs available, Suction available, Patient being monitored and Timeout performed Patient Re-evaluated:Patient Re-evaluated prior to inductionOxygen Delivery Method: Circle system utilized Intubation Type: IV induction Ventilation: Mask ventilation without difficulty and Oral airway inserted - appropriate to patient size Laryngoscope Size: Hyacinth Meeker and 2 Grade View: Grade I Tube type: Oral Tube size: 7.5 mm Number of attempts: 1 Airway Equipment and Method: Stylet Placement Confirmation: ETT inserted through vocal cords under direct vision,  positive ETCO2 and breath sounds checked- equal and bilateral Secured at: 23 cm Tube secured with: Tape Dental Injury: Teeth and Oropharynx as per pre-operative assessment

## 2012-10-22 NOTE — Transfer of Care (Signed)
Immediate Anesthesia Transfer of Care Note  Patient: Erik Fletcher  Procedure(s) Performed: Procedure(s) with comments: POSTERIOR LUMBAR FUSION 1 LEVEL (N/A) - Lumbar Four-Five Redo Laminectomy with Fusion  Patient Location: PACU  Anesthesia Type:General  Level of Consciousness: sedated  Airway & Oxygen Therapy: Patient Spontanous Breathing and Patient connected to face mask oxygen  Post-op Assessment: Report given to PACU RN and Post -op Vital signs reviewed and stable  Post vital signs: Reviewed and stable  Complications: No apparent anesthesia complications

## 2012-10-22 NOTE — Op Note (Signed)
10/22/2012  6:46 PM  PATIENT:  Erik Fletcher  62 y.o. 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, Lumbar radiculopathy L 45  PROCEDURE:  Procedure(s) with comments: POSTERIOR LUMBAR FUSION 1 LEVEL (N/A) - Lumbar Four-Five Redo Laminectomy with Fusion with PEEK interbody cages, Pedicle screw fixation, Posterolateral arthrodesis  SURGEON:  Surgeon(s) and Role:    * Jacquita Mulhearn, MD - Primary    * James R Hirsch, MD - Assisting  PHYSICIAN ASSISTANT:   ASSISTANTS: Poteat, RN   ANESTHESIA:   general  EBL:  Total I/O In: 2200 [I.V.:2200] Out: 850 [Urine:600; Blood:250]  BLOOD ADMINISTERED:none  DRAINS: (Medium) Hemovact drain(s) in the epidural space with  Suction Open   LOCAL MEDICATIONS USED:  MARCAINE     SPECIMEN:  No Specimen  DISPOSITION OF SPECIMEN:  N/A  COUNTS:  YES  TOURNIQUET:  * No tourniquets in log *  DICTATION: Patient is 62-year-old man with mobile spondylolisthesis of L4 on L5 with recurrent lumbar stenosis. He has bilateral L5 radiculopathies. It was elected to take him to surgery for redo decompression and fusion at this level.   Procedure: Patient was placed in a prone position on the Jackson table after smooth and uncomplicated induction of general endotracheal anesthesia. His low back was prepped and draped in usual sterile fashion with Betadine scrub and DuraPrep. Area of incision was infiltrated with local lidocaine. Incision was made to the lumbodorsal fascia was incised and exposure was performed of the L3 through L5 spinous processes laminae facet joint and transverse processes. Intraoperative x-ray was obtained which confirmed correct orientation. A total laminectomy of L4 had already been performed and the  scar tissue was carefully exposed and remaining bony elements defined.  The L4 facet joints were the disarticulated at this level and thorough decompression was performed of both L4 and L5 nerve roots along with the common dural tube, carefully dissecting through scar tissue along the previous decompression. This decompression was more involved than would be typical of that performed for PLIF alone and included painstaking dissection of adherent ligament compressing the thecal sac and wide decompression of all neural elements. A thorough discectomy was initially performed on the left with preparation of the endplates for grafting a trial spacer was placed this level and a thorough discectomy was performed on the right as well. Bone autograft was packed within the interspace bilaterally along with small BMP kit and NexOss bone graft extender. Bilateral medium 11 mm peek cages were packed with BMP and extender and was inserted the interspace and countersunk appropriately along with 8 cc of morselized bone autograft. The posterolateral region was extensively decorticated and pedicle probes were placed at L4 and L5 bilaterally. Intraoperative fluoroscopy confirmed correct orientationin the AP and lateral plane. 45 x 6.5 mm pedicle screws were placed at L5 bilaterally and 50 x 6.5 mm screws placed at L4 bilaterally final x-rays demonstrated well-positioned interbody grafts and pedicle screw fixation. A 40 mm lordotic rod was placed on the right and a 40 mm rod was placed on the left locked down in situ and the posterolateral region was packed with the remaining autograft and  bone graft extender on the right and bone graft extender alone on the left. The wound was irrigated and a medium Hemovac drain was placed in the epidural space. Fascia was closed with 1 Vicryl   sutures skin edges were reapproximated 2 and 3-0 Vicryl sutures. The wound is dressed with benzoin Steri-Strips Telfa gauze and tape the patient was extubated  in the operating room and taken to recovery in stable satisfactory condition. He tolerated the operation well counts were correct at the end of the case.   PLAN OF CARE: Admit to inpatient   PATIENT DISPOSITION:  PACU - hemodynamically stable.   Delay start of Pharmacological VTE agent (>24hrs) due to surgical blood loss or risk of bleeding: yes  

## 2012-10-22 NOTE — Anesthesia Preprocedure Evaluation (Addendum)
Anesthesia Evaluation  Patient identified by MRN, date of birth, ID band Patient awake    Reviewed: Allergy & Precautions, H&P , NPO status , Patient's Chart, lab work & pertinent test results  History of Anesthesia Complications (+) PONV  Airway Mallampati: I TM Distance: >3 FB Neck ROM: Full    Dental  (+) Teeth Intact and Dental Advisory Given   Pulmonary neg pulmonary ROS,  breath sounds clear to auscultation  Pulmonary exam normal       Cardiovascular negative cardio ROS  Rhythm:Regular Rate:Normal     Neuro/Psych Chronic back pain    GI/Hepatic negative GI ROS, Neg liver ROS,   Endo/Other  Morbid obesity  Renal/GU negative Renal ROS     Musculoskeletal   Abdominal (+) + obese,   Peds  Hematology   Anesthesia Other Findings   Reproductive/Obstetrics                           Anesthesia Physical Anesthesia Plan  ASA: II  Anesthesia Plan: General   Post-op Pain Management:    Induction: Intravenous  Airway Management Planned: Oral ETT  Additional Equipment:   Intra-op Plan:   Post-operative Plan: Extubation in OR  Informed Consent: I have reviewed the patients History and Physical, chart, labs and discussed the procedure including the risks, benefits and alternatives for the proposed anesthesia with the patient or authorized representative who has indicated his/her understanding and acceptance.   Dental advisory given  Plan Discussed with: Surgeon, CRNA and Anesthesiologist  Anesthesia Plan Comments: (Plan routine monitors, GETA)       Anesthesia Quick Evaluation

## 2012-10-23 ENCOUNTER — Encounter (HOSPITAL_COMMUNITY): Payer: Self-pay | Admitting: *Deleted

## 2012-10-23 LAB — GLUCOSE, CAPILLARY: Glucose-Capillary: 176 mg/dL — ABNORMAL HIGH (ref 70–99)

## 2012-10-23 NOTE — Plan of Care (Signed)
Problem: Phase II Progression Outcomes Goal: Discharge plan established No follow up OT recommended at this time, tub bench and ADL A/E recommended for use at hoe after acute care d/c

## 2012-10-23 NOTE — Evaluation (Signed)
Occupational Therapy Evaluation Patient Details Name: Erik Fletcher MRN: [id number] DOB: 06-16-1951 Today's Date: 10/23/2012 Time: 6213-0865 OT Time Calculation (min): 34 min  OT Assessment / Plan / Recommendation Clinical Impression  Pt doing well following L4 -5 fusion. Pt at set up/sup level with UB ADLs, min A with LB ADLs and sup/min guard A with ADL mobility. Pt will have 24 hour assist/sup at home. All education completed and no further acute or follow up OT services needed at this time, OT will sign off    OT Assessment  Patient does not need any further OT services    Follow Up Recommendations  No OT follow up    Barriers to Discharge  none    Equipment Recommendations  Tub/shower bench;Other (comment) (ADL A/E kit)    Recommendations for Other Services    Frequency       Precautions / Restrictions Precautions Precautions: Back Precaution Booklet Issued: Yes (comment) Required Braces or Orthoses: Spinal Brace Spinal Brace: Lumbar corset;Applied in sitting position Restrictions Weight Bearing Restrictions: No   Pertinent Vitals/Pain 5/10 back    ADL  Grooming: Performed;Wash/dry hands;Wash/dry face;Supervision/safety;Set up Where Assessed - Grooming: Unsupported standing Upper Body Bathing: Simulated;Supervision/safety;Set up Lower Body Bathing: Minimal assistance Upper Body Dressing: Performed;Supervision/safety;Set up;Other (comment) (assist to donn back brace) Lower Body Dressing: Performed;Minimal assistance Toilet Transfer: Performed;Min guard Toilet Transfer Method: Sit to stand Toilet Transfer Equipment: Raised toilet seat with arms (or 3-in-1 over toilet);Grab bars Toileting - Clothing Manipulation and Hygiene: Performed;Min guard Where Assessed - Engineer, mining and Hygiene: Standing Tub/Shower Transfer: Performed;Min guard Web designer Method: Science writer: Counsellor Used:  Long-handled shoe horn;Long-handled sponge;Reacher;Rolling walker;Gait belt;Sock aid Transfers/Ambulation Related to ADLs: cues to maintain back precautions ADL Comments: Pt provided with education and demo of ADL A/E and tub transfer benach for home use, pt able to return demo    OT Diagnosis:    OT Problem List:   OT Treatment Interventions:     OT Goals    Visit Information  Last OT Received On: 10/23/12 Assistance Needed: +1    Subjective Data  Subjective: " I am goinh home tomorrow " Patient Stated Goal: To return home   Prior Functioning     Home Living Lives With: Spouse Available Help at Discharge: Family;Available PRN/intermittently Type of Home: House Home Access: Stairs to enter [employer] of Steps: 4 Entrance Stairs-Rails: None;Can reach both Home Layout: One level Bathroom Shower/Tub: Tub/shower unit;Door [employer] Toilet: Handicapped height Bathroom Accessibility: Yes How Accessible: Accessible via walker Home Adaptive Equipment: Reacher;Straight cane Prior Function Level of Independence: Independent Able to Take Stairs?: Yes Driving: Yes Vocation: Retired Musician: No difficulties Dominant Hand: Left         Vision/Perception Vision - History Baseline Vision: Wears glasses only for reading Patient Visual Report: No change from baseline Perception Perception: Within Functional Limits   Cognition  Cognition Arousal/Alertness: Awake/alert Behavior During Therapy: WFL for tasks assessed/performed Overall Cognitive Status: Within Functional Limits for tasks assessed    Extremity/Trunk Assessment Right Upper Extremity Assessment RUE ROM/Strength/Tone: WFL for tasks assessed RUE Sensation: WFL - Light Touch RUE Coordination: WFL - gross/fine motor Left Upper Extremity Assessment LUE ROM/Strength/Tone: WFL for tasks assessed LUE Sensation: WFL - Light Touch LUE Coordination: WFL - gross/fine motor Trunk  Assessment: Normal     Mobility Bed Mobility Bed Mobility: Rolling Left;Left Sidelying to Sit;Sitting - Scoot to Edge of Bed Rolling Left: 5: Supervision Left Sidelying to Sit:  5: Supervision Sitting - Scoot to Edge of Bed: 5: Supervision Details for Bed Mobility Assistance: vc for technique to maintain back precautions Transfers Sit to Stand: 4: Min guard;5: Supervision Stand to Sit: 4: Min guard;5: Supervision Details for Transfer Assistance: cues for use of RW x 1; 2nd trial with no device     Exercise     Balance Balance Balance Assessed: Yes Static Standing Balance Static Standing - Balance Support: No upper extremity supported;During functional activity Static Standing - Level of Assistance: 5: Stand by assistance Dynamic Standing Balance Dynamic Standing - Balance Support: No upper extremity supported;During functional activity Dynamic Standing - Level of Assistance: 5: Stand by assistance   End of Session OT - End of Session Equipment Utilized During Treatment: Other (comment) (3 in 1, ADL A/E, tub bench, RW) Activity Tolerance: Patient tolerated treatment well Patient left: in chair;with call bell/phone within reach  GO     [name] 10/23/2012, 12:36 PM

## 2012-10-23 NOTE — Progress Notes (Signed)
Subjective: Patient reports "I feel great!" "I've got some incisional pain but nothing else"  Objective: Vital signs in last 24 hours: Temp:  [97.3 F (36.3 C)-98 F (36.7 C)] 97.3 F (36.3 C) (05/02 0656) Pulse Rate:  [64-86] 66 (05/02 0656) Resp:  [14-24] 18 (05/02 0656) BP: (110-143)/(55-93) 128/93 mmHg (05/02 0656) SpO2:  [95 %-100 %] 99 % (05/02 0400) Weight:  [109.589 kg (241 lb 9.6 oz)] 109.589 kg (241 lb 9.6 oz) (05/01 2047)  Intake/Output from previous day: 05/01 0701 - 05/02 0700 In: 2450 [P.O.:100; I.V.:2350] Out: 3355 [Urine:2875; Drains:230; Blood:250] Intake/Output this shift: Total I/O In: 600 [P.O.:600] Out: -   Alert, conversant. Good appetite. Ambulated in hall this am. Good strength BLE. Drsg clean & dry. Hemovac patent.  Lab Results: No results found for this basename: WBC, HGB, HCT, PLT,  in the last 72 hours BMET No results found for this basename: NA, K, CL, CO2, GLUCOSE, BUN, CREATININE, CALCIUM,  in the last 72 hours  Studies/Results: Dg Lumbar Spine 2-3 Views  10/22/2012  *RADIOLOGY REPORT*  Clinical Data: Back pain  DG C-ARM 1-60 MIN,LUMBAR SPINE - 2-3 VIEW  Comparison: 09/04/2012.  Findings: C-arm films document L4-L5 posterolateral and interbody fusion.  Improved position and alignment.  IMPRESSION: As above.   Original Report Authenticated By: Davonna Belling, M.D.    Dg Lumbar Spine 1 View  10/22/2012  *RADIOLOGY REPORT*  Clinical Data: L4-5 PLIF  LUMBAR SPINE - 1 VIEW  Comparison: MRI lumbar spine dated 09/04/2012  Findings: Surgical probes posterior to the L4 and L5 vertebral bodies.  IMPRESSION: Operative localization as above.   Original Report Authenticated By: Charline Bills, M.D.    Dg C-arm 1-60 Min  10/22/2012  *RADIOLOGY REPORT*  Clinical Data: Back pain  DG C-ARM 1-60 MIN,LUMBAR SPINE - 2-3 VIEW  Comparison: 09/04/2012.  Findings: C-arm films document L4-L5 posterolateral and interbody fusion.  Improved position and alignment.  IMPRESSION:  As above.   Original Report Authenticated By: Davonna Belling, M.D.     Assessment/Plan: Improving   LOS: 1 day  Continue to mobilize in LSO with PT. Per Dr. Venetia Maxon, will d/c PCA.    Georgiann Cocker 10/23/2012, 9:19 AM

## 2012-10-23 NOTE — Progress Notes (Signed)
Nutrition Brief Note  Patient identified on the Malnutrition Screening Tool (MST) Report  Pt reports having 5 lb weight loss over the last 2 months due to pain, which does not reach level of significance. Pt reports normally following healthy diet with daily exercise. Pt with good appetite this am, eating 100% of his Breakfast.   Body mass index is 35.66 kg/(m^2). Patient meets criteria for Obesity Class II based on current BMI.   Current diet order is Regular, patient is consuming approximately 100% of meals at this time. Labs and medications reviewed.   No nutrition interventions warranted at this time. If nutrition issues arise, please consult RD.   Kendell Bane RD, LDN, CNSC [phone] Pager [phone] After Hours Pager

## 2012-10-23 NOTE — Progress Notes (Signed)
Mobilize today.  D/C PCA.

## 2012-10-23 NOTE — Evaluation (Signed)
Physical Therapy Evaluation Patient Details Name: Erik Fletcher MRN: [id number] DOB: [birth year] Today's Date: 10/23/2012 Time: 2130-8657 PT Time Calculation (min): 24 min  PT Assessment / Plan / Recommendation Clinical Impression    Patient is s/p L4-5 fusion resulting in the deficits listed below (PT Problem List). Patient will benefit from skilled PT to increase their independence and safety with mobility (while adhering to their back precautions) to allow discharge home with family's assist.      PT Assessment  Patient needs continued PT services    Follow Up Recommendations  No PT follow up;Supervision - Intermittent    Does the patient have the potential to tolerate intense rehabilitation      Barriers to Discharge        Equipment Recommendations  None recommended by PT    Recommendations for Other Services     Frequency Min 5X/week    Precautions / Restrictions Precautions Precautions: Back Precaution Booklet Issued: Yes (comment) Required Braces or Orthoses: Spinal Brace Spinal Brace: Lumbar corset;Applied in sitting position   Pertinent Vitals/Pain 5/10 back pain      Mobility  Bed Mobility Bed Mobility: Rolling Left;Left Sidelying to Sit;Sitting - Scoot to Edge of Bed Rolling Left: 5: Supervision Left Sidelying to Sit: 5: Supervision Sitting - Scoot to Edge of Bed: 5: Supervision Details for Bed Mobility Assistance: vc for technique to maintain back precautions Transfers Transfers: Sit to Stand;Stand to Sit Sit to Stand: 4: Min guard;5: Supervision Stand to Sit: 4: Min guard;5: Supervision Details for Transfer Assistance: cues for use of RW x 1; 2nd trial with no device Ambulation/Gait Ambulation/Gait Assistance: 4: Min guard;5: Supervision Ambulation Distance (Feet): 200 Feet (25 ft with RW; 175 ft no device) Assistive device: Rolling walker;None Ambulation/Gait Assistance Details: pt with very light use of RW initially and agreed he wanted to  trial ambulation without a device; pt with no limping and appears steady Gait Pattern: Within Functional Limits    Exercises     PT Diagnosis: Acute pain  PT Problem List: Decreased mobility;Decreased knowledge of use of DME;Decreased knowledge of precautions;Pain PT Treatment Interventions: DME instruction;Gait training;Stair training;Functional mobility training;Therapeutic activities;Patient/family education   PT Goals Acute Rehab PT Goals PT Goal Formulation: With patient Time For Goal Achievement: 10/24/12 Potential to Achieve Goals: Good Pt will go Supine/Side to Sit: with modified independence PT Goal: Supine/Side to Sit - Progress: Goal set today Pt will go Sit to Supine/Side: with modified independence PT Goal: Sit to Supine/Side - Progress: Goal set today Pt will go Sit to Stand: with supervision PT Goal: Sit to Stand - Progress: Goal set today Pt will Go Up / Down Stairs: 3-5 stairs;with min assist PT Goal: Up/Down Stairs - Progress: Goal set today Additional Goals Additional Goal #1: pt will verbalize and adhere to back precautions while mobilizing PT Goal: Additional Goal #1 - Progress: Goal set today  Visit Information  Last PT Received On: 10/23/12 Assistance Needed: +1 PT/OT Co-Evaluation/Treatment: Yes    Subjective Data  Patient Stated Goal: return home tomorrow   Prior Functioning  Home Living Lives With: Spouse Available Help at Discharge: Family;Available PRN/intermittently Type of Home: House Home Access: Stairs to enter [employer] of Steps: 4 Entrance Stairs-Rails: None;Can reach both Home Layout: One level Bathroom Shower/Tub: Tub/shower unit;Door [employer] Toilet: Handicapped height Bathroom Accessibility: Yes How Accessible: Accessible via walker Home Adaptive Equipment: Reacher;Straight cane Prior Function Level of Independence: Independent Able to Take Stairs?: Yes Driving: Yes Vocation: Retired Musician:  No difficulties Dominant Hand: Left    Cognition  Cognition Arousal/Alertness: Awake/alert Behavior During Therapy: WFL for tasks assessed/performed Overall Cognitive Status: Within Functional Limits for tasks assessed    Extremity/Trunk Assessment Right Lower Extremity Assessment RLE ROM/Strength/Tone: WFL for tasks assessed RLE Sensation: WFL - Light Touch RLE Coordination: WFL - gross motor Left Lower Extremity Assessment LLE Sensation: Deficits LLE Sensation Deficits: numbness 4-5 toes after previous back surgery LLE Coordination: WFL - gross motor Trunk Assessment Trunk Assessment: Normal   Balance    End of Session PT - End of Session Equipment Utilized During Treatment: Back brace Activity Tolerance: Patient tolerated treatment well Patient left: in chair;with call bell/phone within reach;Other (comment) (with OT)  GP     Erik Fletcher 10/23/2012, 12:12 PM  10/23/2012 Veda Canning, PT Pager: [phone]

## 2012-10-23 NOTE — Progress Notes (Signed)
Resident states "I have tingling in the last two toes on my left foot,but its been like that since my last surgery."  Resident denies an increase in the tingling. Will continue to monitor.

## 2012-10-23 NOTE — Progress Notes (Signed)
Patient ambulated with walker in hallway this am. Tolerated ambulation well. No c/o nausea or dizziness. Foley catheter discontinued. Fluids encouraged. No c/o pain/discomfort at this time. Will continue to monitor.

## 2012-10-23 NOTE — Clinical Social Work Note (Signed)
Clinical Social Work   CSW received consult for SNF. CSW reviewed chart. PT/OT are not recommending any follow up. CSW will update RNCM. CSW is off as no further needs identified. Please reconsult if a need arises prior to discharge.   Dede Query, MSW, LCSW [phone]

## 2012-10-24 LAB — GLUCOSE, CAPILLARY

## 2012-10-24 MED ORDER — OXYCODONE-ACETAMINOPHEN 5-325 MG PO TABS: 1.0000 | ORAL_TABLET | ORAL | Status: DC | PRN

## 2012-10-24 NOTE — Progress Notes (Signed)
Physical Therapy Treatment Patient Details Name: Erik Fletcher MRN: [id number] DOB: 07-27-1950 Today's Date: 10/24/2012 Time:  -     PT Assessment / Plan / Recommendation Comments on Treatment Session  Pt progressing well with mobility & PT goals at this date.  Ambulated 300' with no AD & performed stairs with (S).   Pt reports mild discomfort/pain only at incision site    Follow Up Recommendations  No PT follow up;Supervision - Intermittent     Does the patient have the potential to tolerate intense rehabilitation     Barriers to Discharge        Equipment Recommendations  None recommended by PT    Recommendations for Other Services    Frequency Min 5X/week   Plan Discharge plan remains appropriate    Precautions / Restrictions Precautions Precautions: Back Required Braces or Orthoses: Spinal Brace Spinal Brace: Lumbar corset;Applied in sitting position Restrictions Weight Bearing Restrictions: No   Pertinent Vitals/Pain Reports pain as "mild" at incision site.      Mobility  Transfers Transfers: Sit to Stand;Stand to Sit Sit to Stand: 5: Supervision;With upper extremity assist;With armrests;From chair/3-in-1 Stand to Sit: 5: Supervision;With upper extremity assist;With armrests;To chair/3-in-1 Details for Transfer Assistance: cues to reinforce hand placement Ambulation/Gait Ambulation/Gait Assistance: 5: Supervision Ambulation Distance (Feet): 300 Feet Assistive device: None Ambulation/Gait Assistance Details: Pt steady.   Gait Pattern: Within Functional Limits Stairs: Yes Stairs Assistance: 5: Supervision Stairs Assistance Details (indicate cue type and reason): (S) for safety Stair Management Technique: Two rails;Step to pattern;Forwards Number of Stairs: 5 Wheelchair Mobility Wheelchair Mobility: No      PT Goals Acute Rehab PT Goals Time For Goal Achievement: 10/24/12 Potential to Achieve Goals: Good Pt will go Supine/Side to Sit: with modified  independence Pt will go Sit to Supine/Side: with modified independence Pt will go Sit to Stand: with supervision PT Goal: Sit to Stand - Progress: Met Pt will Go Up / Down Stairs: 3-5 stairs;with min assist PT Goal: Up/Down Stairs - Progress: Met Additional Goals Additional Goal #1: pt will verbalize and adhere to back precautions while mobilizing PT Goal: Additional Goal #1 - Progress: Partly met  Visit Information  Last PT Received On: 10/24/12 Assistance Needed: +1    Subjective Data  Patient Stated Goal: return home    Cognition  Cognition Arousal/Alertness: Awake/alert Behavior During Therapy: WFL for tasks assessed/performed Overall Cognitive Status: Within Functional Limits for tasks assessed    Balance     End of Session PT - End of Session Equipment Utilized During Treatment: Back brace Activity Tolerance: Patient tolerated treatment well Patient left: in chair;with call bell/phone within reach Nurse Communication: Mobility status    Verdell Face, [place] [phone] 10/24/2012

## 2012-10-24 NOTE — Discharge Summary (Signed)
Physician Discharge Summary  Patient ID: Erik Fletcher MRN: [id number] DOB/AGE: 1950/11/24 62 y.o.  Admit date: 10/22/2012 Discharge date: 10/24/2012  Admission Diagnoses:  Discharge Diagnoses:  Active Problems:   * No active hospital problems. *   Discharged Condition: good  Hospital Course: Surgery two days ago for lumbar fusion. Did well. Pain much better. Wound healing well. Home pod 2 . Specific instructions given  Consults: None  Significant Diagnostic Studies: none  Treatments: surgery: L 45 plif  Discharge Exam: Blood pressure 121/68, pulse 74, temperature 99.2 F (37.3 C), temperature source Oral, resp. rate 20, height 5\' 9"  (1.753 m), weight 109.589 kg (241 lb 9.6 oz), SpO2 98.00%. Incision/Wound:clean and dry; neuro stable  Disposition:   Discharge Orders   Future Orders Complete By Expires     Call MD for:  difficulty breathing, headache or visual disturbances  As directed     Call MD for:  hives  As directed     Call MD for:  persistant nausea and vomiting  As directed     Call MD for:  redness, tenderness, or signs of infection (pain, swelling, redness, odor or green/yellow discharge around incision site)  As directed     Call MD for:  severe uncontrolled pain  As directed     Call MD for:  temperature >100.4  As directed     Diet general  As directed     Discharge instructions  As directed     Comments:      Mostly bedrest. Get up 9 or 10 times each day and walk for 15-20 minutes each time. Very little sitting the first week. No riding in the car until your first post op appointment. If you had neck surgery...may shower from the chest down. If you had low back surgery....you may shower with a saran wrap covering over the incision. Take your pain medicine as needed...and other medicines that you are instructed to take. Call for an appointment...[phone].        Medication List    STOP taking these medications       ibuprofen 200 MG tablet   Commonly known as:  ADVIL,MOTRIN      TAKE these medications       diazepam 10 MG tablet  Commonly known as:  VALIUM  Take 10 mg by mouth daily as needed (spasm).     latanoprost 0.005 % ophthalmic solution  Commonly known as:  XALATAN  Place 1 drop into both eyes at bedtime.     oxyCODONE-acetaminophen 5-325 MG per tablet  Commonly known as:  PERCOCET/ROXICET  Take 1-2 tablets by mouth every 4 (four) hours as needed.     oxyCODONE-acetaminophen 5-325 MG per tablet  Commonly known as:  PERCOCET/ROXICET  Take 1 tablet by mouth 2 (two) times daily as needed for pain.         At home rest most of the time. Get up 9 or 10 times each day and take a 15 or 20 minute walk. No riding in the car and to your first postoperative appointment. If you have neck surgery you may shower from the chest down starting on the third postoperative day. If you had back surgery he may start showering on the third postoperative day with saran wrap wrapped around your incisional area 3 times. After the shower remove the saran wrap. Take pain medicine as needed and other medications as instructed. Call my office for an appointment.  SignedReinaldo Meeker, MD 10/24/2012, 7:53 AM

## 2012-10-29 MED FILL — Heparin Sodium (Porcine) Inj 1000 Unit/ML: INTRAMUSCULAR | Qty: 30 | Status: AC

## 2012-10-29 MED FILL — Sodium Chloride IV Soln 0.9%: INTRAVENOUS | Qty: 1000 | Status: AC

## 2014-06-20 ENCOUNTER — Other Ambulatory Visit: Payer: Self-pay | Admitting: Neurosurgery

## 2014-06-20 DIAGNOSIS — M545 Low back pain: Secondary | ICD-10-CM

## 2014-06-20 DIAGNOSIS — M542 Cervicalgia: Secondary | ICD-10-CM

## 2014-07-01 ENCOUNTER — Ambulatory Visit
Admission: RE | Admit: 2014-07-01 | Discharge: 2014-07-01 | Disposition: A | Discharge status: Home / Self Care | Payer: Medicare Other | Source: Ambulatory Visit | Attending: Neurosurgery | Admitting: Neurosurgery

## 2014-07-01 DIAGNOSIS — M542 Cervicalgia: Secondary | ICD-10-CM

## 2014-07-01 DIAGNOSIS — M545 Low back pain: Secondary | ICD-10-CM

## 2014-07-01 MED ORDER — GADOBENATE DIMEGLUMINE 529 MG/ML IV SOLN
20.0000 mL | Freq: Once | INTRAVENOUS | Status: AC | PRN
  Administered 2014-07-01: 20 mL via INTRAVENOUS

## 2015-04-27 ENCOUNTER — Other Ambulatory Visit: Payer: Self-pay | Admitting: Neurosurgery

## 2015-04-27 DIAGNOSIS — M5416 Radiculopathy, lumbar region: Secondary | ICD-10-CM

## 2015-05-06 ENCOUNTER — Ambulatory Visit
Admission: RE | Admit: 2015-05-06 | Discharge: 2015-05-06 | Disposition: A | Discharge status: Home / Self Care | Payer: Medicare Other | Source: Ambulatory Visit | Attending: Neurosurgery | Admitting: Neurosurgery

## 2015-05-06 DIAGNOSIS — M5416 Radiculopathy, lumbar region: Secondary | ICD-10-CM

## 2015-05-06 MED ORDER — GADOBENATE DIMEGLUMINE 529 MG/ML IV SOLN
20.0000 mL | Freq: Once | INTRAVENOUS | Status: AC | PRN
  Administered 2015-05-06: 20 mL via INTRAVENOUS

## 2015-06-02 ENCOUNTER — Other Ambulatory Visit: Payer: Self-pay | Admitting: Neurosurgery

## 2015-06-16 NOTE — Pre-Procedure Instructions (Signed)
    Erik Fletcher  06/16/2015      [facility] [place], [place]. [place]. [place] [address] Phone: [phone] Fax: [phone]    Your procedure is scheduled on Tuesday, January 3.   Report to [facility] Admitting at.11 AM.                   Your surgery or procedure is scheduled for 2 PM   Call this number if you have problems the morning of surgery: [phone]               For any other questions, please call [phone], Monday - Friday 8 AM - 4 PM.    Remember:  Do not eat food or drink liquids after midnight Monday, January 2.  Take these medicines the morning of surgery with A SIP OF WATER :  May use eye drops.   May take oxyCODONE-acetaminophen (PERCOCET/ROXICET) if needed and you tolerate with out food.              STOP taking Ibuprofen (Advil).   Do not wear jewelry, make-up or nail polish.  Do not wear lotions, powders, or perfumes.               Men may shave face and neck  Do not bring valuables to the hospital.  [facility] is not responsible for any belongings or valuables.  Contacts, dentures or bridgework may not be worn into surgery.  Leave your suitcase in the car.  After surgery it may be brought to your room.  For patients admitted to the hospital, discharge time will be determined by your treatment team.  Patients discharged the day of surgery will not be allowed to drive home.   Name and phone number of your driver:  -  Special instructions: Review  [facility] - Preparing For Surgery.  Please read over the following fact sheets that you were given. Pain Booklet, Coughing and Deep Breathing and Surgical Site Infection Prevention

## 2015-06-20 ENCOUNTER — Encounter (HOSPITAL_COMMUNITY)
Admission: RE | Admit: 2015-06-20 | Discharge: 2015-06-20 | Disposition: A | Discharge status: Home / Self Care | Payer: Medicare Other | Source: Ambulatory Visit | Attending: Neurosurgery | Admitting: Neurosurgery

## 2015-06-20 ENCOUNTER — Encounter (HOSPITAL_COMMUNITY): Payer: Self-pay

## 2015-06-20 DIAGNOSIS — M5416 Radiculopathy, lumbar region: Secondary | ICD-10-CM | POA: Insufficient documentation

## 2015-06-20 DIAGNOSIS — Z981 Arthrodesis status: Secondary | ICD-10-CM | POA: Insufficient documentation

## 2015-06-20 DIAGNOSIS — Z01812 Encounter for preprocedural laboratory examination: Secondary | ICD-10-CM | POA: Insufficient documentation

## 2015-06-20 DIAGNOSIS — Z01818 Encounter for other preprocedural examination: Secondary | ICD-10-CM | POA: Diagnosis present

## 2015-06-20 LAB — BASIC METABOLIC PANEL
ANION GAP: 10 (ref 5–15)
BUN: 25 mg/dL — ABNORMAL HIGH (ref 6–20)
CALCIUM: 9.4 mg/dL (ref 8.9–10.3)
CO2: 24 mmol/L (ref 22–32)
Chloride: 107 mmol/L (ref 101–111)
Creatinine, Ser: 1.01 mg/dL (ref 0.61–1.24)
GLUCOSE: 207 mg/dL — AB (ref 65–99)
POTASSIUM: 4.2 mmol/L (ref 3.5–5.1)
Sodium: 141 mmol/L (ref 135–145)

## 2015-06-20 LAB — CBC
HEMATOCRIT: 43.7 % (ref 39.0–52.0)
HEMOGLOBIN: 15.1 g/dL (ref 13.0–17.0)
MCH: 32 pg (ref 26.0–34.0)
MCHC: 34.6 g/dL (ref 30.0–36.0)
MCV: 92.6 fL (ref 78.0–100.0)
PLATELETS: 191 10*3/uL (ref 150–400)
RBC: 4.72 MIL/uL (ref 4.22–5.81)
RDW: 12.5 % (ref 11.5–15.5)
WBC: 4.9 10*3/uL (ref 4.0–10.5)

## 2015-06-20 LAB — SURGICAL PCR SCREEN
MRSA, PCR: NEGATIVE
STAPHYLOCOCCUS AUREUS: NEGATIVE

## 2015-06-20 LAB — TYPE AND SCREEN
ABO/RH(D): O POS
ANTIBODY SCREEN: NEGATIVE

## 2015-06-20 NOTE — Progress Notes (Signed)
Pt. States that he is not diabetic,but has had consistently elevated glucose. Glucose on  BMET today is 207.

## 2015-06-21 NOTE — Progress Notes (Addendum)
Anesthesia Chart Review: Patient is a 64 year old male scheduled for redo decompression and fusion at L3-4 with exploration of L4-5 fusion on 06/27/15 by Dr. Vertell Limber.  History includes nonsmoker, postoperative nausea and vomiting, glaucoma, paresthesias feet, L4-5 redo laminectomy with fusion 2014, C5-7 ACDF 2011. BMI is consistent with obesity. He does not have a PCP.  He denied chest pain, SOB, syncope, and prior cardiac studies other than EKGs.  Preoperative labs noted. Glucose 207. CBC WNL. T&S done. He reports labs were non-fasting--had peanut butter, raisins, and milk. He does have access to a glucose meter and periodically checks his glucose which have been < 100. His last steroid injection was in 02/2015. He denies polydipsia, polyuria, visual disturbances. He will plan on checking a fasting glucose between now and his surgery date. I am anticipating that it will be in an acceptable range for surgery; however, we did discuss that fasting glucose > 109 would warrant his getting established with a PCP to evaluate for possible diabetes. Will will plan to check a fasting CBG on the day of surgery. I have notified Jessica at Dr. Melven Sartorius office of patient's elevated glucose as he may want to monitor glucose post-operatively.  George Hugh [facility] Short Stay Center/Anesthesiology Phone [phone] 06/21/2015 11:53 AM  Addendum: Patient called today. CBG taken between breakfast and lunch yesterday was 151. Fasting glucose this morning was 115.  I suspect he will be able to proceed as planned based on these readings. Will check a CBG on arrival.  George Hugh [facility] Short Stay Center/Anesthesiology Phone [phone] 06/23/2015 2:30 PM

## 2015-06-23 NOTE — Discharge Instructions (Signed)

## 2015-06-25 NOTE — H&P (Signed)
Patient ID:   [id number] Patient: Erik Fletcher  Date of Birth: 04-28-1951 Visit Type: Office Visit   Date: 05/31/2015 12:45 PM Provider: Marchia Meiers. Vertell Limber MD   This 65 year old male presents for back pain and numbness.  History of Present Illness: 1.  back pain  2.  numbness  The patient is describing worsening leg symptoms.  He describes that he feels like "an old man walking and going up steps" he feels that his strength problems are worsening and he is not improved.  On examination the patient has weakness in his left quadriceps which is identified by decreased ability to squat on his left leg compared to the right.  He has a positive straight leg raise on the left.  The patient has an MRI which shows a large disc herniation at the L3 L4 level on the left with significant thecal sac and left L4 nerve root compression.  I have recommended to the patient based on the severity of his pain and the failure to improve with conservative management that we proceed with operation of fusion at the L4-L5 level with L3 L4 redo decompression and fusion.  The patient wishes to proceed and wants to have this done in early January.      Medical/Surgical/Interim History Reviewed, no change.   PAST MEDICAL HISTORY, SURGICAL HISTORY, FAMILY HISTORY, SOCIAL HISTORY AND REVIEW OF SYSTEMS I have reviewed the patient's past medical, surgical, family and social history as well as the comprehensive review of systems as included on the [facility] history form dated 07/25/2014, which I have signed.  Family History: Reviewed, no changes.   Patient reports there is no relevant family history.   Social History: Tobacco use reviewed. Reviewed, no changes. Last detailed document date: 03/22/2013.      MEDICATIONS(added, continued or stopped this visit): Started Medication Directions Instruction Stopped   latanoprost 0.005 % eye drops instill 1 drop by ophthalmic route   every day into affected eye(s) in the evening       ALLERGIES: Ingredient Reaction Medication Name Comment  NO KNOWN ALLERGIES     No known allergies. Reviewed, no changes.    Vitals Date Temp F BP Pulse Ht In Wt Lb BMI BSA Pain Score  05/31/2015  120/71 86 69 233 34.41  1/10      IMPRESSION I have recommended to the patient based on the severity of his pain and the failure to improve with conservative management that we proceed with operation of fusion at the L4-L5 level with L3 L4 redo decompression and fusion.  The patient wishes to proceed and wants to have this done in early January.  He already has an LSO brace.  Completed Orders (this encounter) Order Details Reason Side Interpretation Result Initial Treatment Date Region  Lifestyle education regarding diet Encouraged to eat a well balanced diet and follow up with primary care physician.         Assessment/Plan # Detail Type Description   1. Assessment Disc displacement, lumbar (M51.26).       2. Assessment Low back pain, unspecified back pain laterality, with sciatica presence unspecified (M54.5).       3. Assessment Body mass index (BMI) 34.0-34.9, adult (Z68.34).   Plan Orders Today's instructions / counseling include(s) Lifestyle education regarding diet.       4. Assessment Lumbar stenosis (M48.06).       5. Assessment Radiculopathy, lumbar region (M54.16).         Pain  Assessment/Treatment Pain Scale: 1/10. Method: Numeric Pain Intensity Scale. Location: back. Onset: 05/30/2013. Duration: varies. Quality: discomforting. Pain Assessment/Treatment follow-up plan of care: Patient is taking medications as prescribed..  Fall Risk Plan The patient has not fallen in the last year.  Risks and benefits were discussed in detail with the patient and he wishes to proceed with surgery as described above.  This is scheduled for 06/27/15.  Orders: Diagnostic Procedures: Assessment Procedure  M54.16 redo  decompression + fusion L3-L4, with exploration of fusion L4-L5  Instruction(s)/Education: Assessment Instruction  Z68.34 Lifestyle education regarding diet             Provider:  Marchia Meiers. Vertell Limber MD  06/03/2015 02:37 PM Dictation edited by: Marchia Meiers. Vertell Limber    CC Providers: Erline Levine MD [address] [place], [place] [address]              Electronically signed by Marchia Meiers. Vertell Limber MD on 06/03/2015 02:37 PM

## 2015-06-26 MED ORDER — CEFAZOLIN SODIUM-DEXTROSE 2-3 GM-% IV SOLR
2.0000 g | INTRAVENOUS | Status: AC
  Administered 2015-06-27: 2 g via INTRAVENOUS
  Filled 2015-06-26: qty 50

## 2015-06-27 ENCOUNTER — Inpatient Hospital Stay (HOSPITAL_COMMUNITY): Payer: Medicare Other

## 2015-06-27 ENCOUNTER — Inpatient Hospital Stay (HOSPITAL_COMMUNITY)
Admission: RE | Admit: 2015-06-27 | Discharge: 2015-06-29 | DRG: 460 | Disposition: A | Discharge status: Home / Self Care | Payer: Medicare Other | Source: Ambulatory Visit | Attending: Neurosurgery | Admitting: Neurosurgery

## 2015-06-27 ENCOUNTER — Inpatient Hospital Stay (HOSPITAL_COMMUNITY): Payer: Medicare Other | Admitting: Vascular Surgery

## 2015-06-27 ENCOUNTER — Inpatient Hospital Stay (HOSPITAL_COMMUNITY): Payer: Medicare Other | Admitting: Anesthesiology

## 2015-06-27 ENCOUNTER — Encounter (HOSPITAL_COMMUNITY): Payer: Self-pay | Admitting: Surgery

## 2015-06-27 ENCOUNTER — Encounter (HOSPITAL_COMMUNITY)
Admission: RE | Disposition: A | Discharge status: Home / Self Care | Payer: Self-pay | Source: Ambulatory Visit | Attending: Neurosurgery

## 2015-06-27 DIAGNOSIS — M4806 Spinal stenosis, lumbar region: Secondary | ICD-10-CM | POA: Diagnosis present

## 2015-06-27 DIAGNOSIS — M5116 Intervertebral disc disorders with radiculopathy, lumbar region: Secondary | ICD-10-CM | POA: Diagnosis present

## 2015-06-27 DIAGNOSIS — Z419 Encounter for procedure for purposes other than remedying health state, unspecified: Secondary | ICD-10-CM

## 2015-06-27 DIAGNOSIS — M549 Dorsalgia, unspecified: Secondary | ICD-10-CM | POA: Diagnosis present

## 2015-06-27 SURGERY — POSTERIOR LUMBAR FUSION 1 LEVEL
Anesthesia: General | Site: Back

## 2015-06-27 MED ORDER — BUPIVACAINE LIPOSOME 1.3 % IJ SUSP
20.0000 mL | Freq: Once | INTRAMUSCULAR | Status: DC
  Filled 2015-06-27: qty 20

## 2015-06-27 MED ORDER — GLYCOPYRROLATE 0.2 MG/ML IJ SOLN
INTRAMUSCULAR | Status: DC | PRN
  Administered 2015-06-27: 0.6 mg via INTRAVENOUS

## 2015-06-27 MED ORDER — METHOCARBAMOL 500 MG PO TABS
500.0000 mg | ORAL_TABLET | Freq: Four times a day (QID) | ORAL | Status: DC | PRN
  Filled 2015-06-27: qty 1

## 2015-06-27 MED ORDER — THROMBIN 20000 UNITS EX SOLR
CUTANEOUS | Status: DC | PRN
  Administered 2015-06-27: 15:00:00 via TOPICAL

## 2015-06-27 MED ORDER — HYDROCODONE-ACETAMINOPHEN 5-325 MG PO TABS: 1.0000 | ORAL_TABLET | ORAL | Status: DC | PRN

## 2015-06-27 MED ORDER — ALBUMIN HUMAN 5 % IV SOLN
INTRAVENOUS | Status: DC | PRN
  Administered 2015-06-27: 17:00:00 via INTRAVENOUS

## 2015-06-27 MED ORDER — HYDROMORPHONE HCL 1 MG/ML IJ SOLN: 0.2500 mg | INTRAMUSCULAR | Status: DC | PRN

## 2015-06-27 MED ORDER — SODIUM CHLORIDE 0.9 % IJ SOLN
3.0000 mL | Freq: Two times a day (BID) | INTRAMUSCULAR | Status: DC
  Administered 2015-06-27: 3 mL via INTRAVENOUS

## 2015-06-27 MED ORDER — LATANOPROST 0.005 % OP SOLN
1.0000 [drp] | Freq: Every day | OPHTHALMIC | Status: DC
  Administered 2015-06-27 – 2015-06-28 (×2): 1 [drp] via OPHTHALMIC
  Filled 2015-06-27: qty 2.5

## 2015-06-27 MED ORDER — FENTANYL CITRATE (PF) 250 MCG/5ML IJ SOLN
INTRAMUSCULAR | Status: AC
  Filled 2015-06-27: qty 5

## 2015-06-27 MED ORDER — MIDAZOLAM HCL 5 MG/5ML IJ SOLN
INTRAMUSCULAR | Status: DC | PRN
  Administered 2015-06-27: 2 mg via INTRAVENOUS

## 2015-06-27 MED ORDER — PANTOPRAZOLE SODIUM 40 MG IV SOLR
40.0000 mg | Freq: Every day | INTRAVENOUS | Status: DC
  Administered 2015-06-27: 40 mg via INTRAVENOUS
  Filled 2015-06-27: qty 40

## 2015-06-27 MED ORDER — LACTATED RINGERS IV SOLN
INTRAVENOUS | Status: DC
  Administered 2015-06-27 (×3): via INTRAVENOUS

## 2015-06-27 MED ORDER — OXYCODONE-ACETAMINOPHEN 5-325 MG PO TABS: 1.0000 | ORAL_TABLET | ORAL | Status: DC | PRN

## 2015-06-27 MED ORDER — HYDROMORPHONE HCL 1 MG/ML IJ SOLN: 0.5000 mg | INTRAMUSCULAR | Status: DC | PRN

## 2015-06-27 MED ORDER — OXYCODONE-ACETAMINOPHEN 5-325 MG PO TABS
1.0000 | ORAL_TABLET | ORAL | Status: DC | PRN
  Filled 2015-06-27: qty 2

## 2015-06-27 MED ORDER — LIDOCAINE-EPINEPHRINE 1 %-1:100000 IJ SOLN
INTRAMUSCULAR | Status: DC | PRN
  Administered 2015-06-27: 18 mL

## 2015-06-27 MED ORDER — EPHEDRINE SULFATE 50 MG/ML IJ SOLN
INTRAMUSCULAR | Status: DC | PRN
  Administered 2015-06-27 (×2): 10 mg via INTRAVENOUS

## 2015-06-27 MED ORDER — ONDANSETRON HCL 4 MG/2ML IJ SOLN: 4.0000 mg | INTRAMUSCULAR | Status: DC | PRN

## 2015-06-27 MED ORDER — ROCURONIUM BROMIDE 100 MG/10ML IV SOLN
INTRAVENOUS | Status: DC | PRN
  Administered 2015-06-27 (×2): 20 mg via INTRAVENOUS
  Administered 2015-06-27: 50 mg via INTRAVENOUS
  Administered 2015-06-27: 10 mg via INTRAVENOUS

## 2015-06-27 MED ORDER — DEXAMETHASONE SODIUM PHOSPHATE 10 MG/ML IJ SOLN
INTRAMUSCULAR | Status: DC | PRN
  Administered 2015-06-27: 10 mg via INTRAVENOUS

## 2015-06-27 MED ORDER — ACETAMINOPHEN 325 MG PO TABS: 650.0000 mg | ORAL_TABLET | ORAL | Status: DC | PRN

## 2015-06-27 MED ORDER — NEOSTIGMINE METHYLSULFATE 10 MG/10ML IV SOLN
INTRAVENOUS | Status: DC | PRN
  Administered 2015-06-27: 4 mg via INTRAVENOUS

## 2015-06-27 MED ORDER — PROPOFOL 10 MG/ML IV BOLUS
INTRAVENOUS | Status: AC
  Filled 2015-06-27: qty 20

## 2015-06-27 MED ORDER — FENTANYL CITRATE (PF) 250 MCG/5ML IJ SOLN
INTRAMUSCULAR | Status: DC | PRN
  Administered 2015-06-27: 100 ug via INTRAVENOUS
  Administered 2015-06-27 (×2): 50 ug via INTRAVENOUS
  Administered 2015-06-27: 100 ug via INTRAVENOUS

## 2015-06-27 MED ORDER — ALUM & MAG HYDROXIDE-SIMETH 200-200-20 MG/5ML PO SUSP: 30.0000 mL | Freq: Four times a day (QID) | ORAL | Status: DC | PRN

## 2015-06-27 MED ORDER — KCL IN DEXTROSE-NACL 20-5-0.45 MEQ/L-%-% IV SOLN: INTRAVENOUS | Status: DC

## 2015-06-27 MED ORDER — BISACODYL 10 MG RE SUPP: 10.0000 mg | Freq: Every day | RECTAL | Status: DC | PRN

## 2015-06-27 MED ORDER — SODIUM CHLORIDE 0.9 % IJ SOLN: 3.0000 mL | INTRAMUSCULAR | Status: DC | PRN

## 2015-06-27 MED ORDER — CEFAZOLIN SODIUM-DEXTROSE 2-3 GM-% IV SOLR
2.0000 g | Freq: Three times a day (TID) | INTRAVENOUS | Status: AC
  Administered 2015-06-27 – 2015-06-28 (×2): 2 g via INTRAVENOUS
  Filled 2015-06-27 (×2): qty 50

## 2015-06-27 MED ORDER — ONDANSETRON HCL 4 MG/2ML IJ SOLN
INTRAMUSCULAR | Status: DC | PRN
  Administered 2015-06-27 (×2): 4 mg via INTRAVENOUS

## 2015-06-27 MED ORDER — ACETAMINOPHEN 650 MG RE SUPP: 650.0000 mg | RECTAL | Status: DC | PRN

## 2015-06-27 MED ORDER — BUPIVACAINE HCL (PF) 0.5 % IJ SOLN
INTRAMUSCULAR | Status: DC | PRN
  Administered 2015-06-27: 18 mL

## 2015-06-27 MED ORDER — METHOCARBAMOL 1000 MG/10ML IJ SOLN
500.0000 mg | Freq: Four times a day (QID) | INTRAVENOUS | Status: DC | PRN
  Filled 2015-06-27: qty 5

## 2015-06-27 MED ORDER — MIDAZOLAM HCL 2 MG/2ML IJ SOLN
INTRAMUSCULAR | Status: AC
  Filled 2015-06-27: qty 2

## 2015-06-27 MED ORDER — OXYCODONE HCL 5 MG/5ML PO SOLN: 5.0000 mg | Freq: Once | ORAL | Status: DC | PRN

## 2015-06-27 MED ORDER — CYCLOSPORINE 0.05 % OP EMUL
1.0000 [drp] | Freq: Two times a day (BID) | OPHTHALMIC | Status: DC
  Administered 2015-06-28 (×2): 1 [drp] via OPHTHALMIC
  Filled 2015-06-27 (×5): qty 1

## 2015-06-27 MED ORDER — 0.9 % SODIUM CHLORIDE (POUR BTL) OPTIME
TOPICAL | Status: DC | PRN
  Administered 2015-06-27: 1000 mL

## 2015-06-27 MED ORDER — PROMETHAZINE HCL 25 MG/ML IJ SOLN: 6.2500 mg | INTRAMUSCULAR | Status: DC | PRN

## 2015-06-27 MED ORDER — PHENOL 1.4 % MT LIQD
1.0000 | OROMUCOSAL | Status: DC | PRN
Start: 2015-06-27 — End: 2015-06-29

## 2015-06-27 MED ORDER — PROPOFOL 10 MG/ML IV BOLUS
INTRAVENOUS | Status: DC | PRN
  Administered 2015-06-27: 50 mg via INTRAVENOUS
  Administered 2015-06-27: 150 mg via INTRAVENOUS

## 2015-06-27 MED ORDER — MENTHOL 3 MG MT LOZG
1.0000 | LOZENGE | OROMUCOSAL | Status: DC | PRN
  Filled 2015-06-27: qty 9

## 2015-06-27 MED ORDER — POLYETHYLENE GLYCOL 3350 17 G PO PACK: 17.0000 g | PACK | Freq: Every day | ORAL | Status: DC | PRN

## 2015-06-27 MED ORDER — FLEET ENEMA 7-19 GM/118ML RE ENEM
1.0000 | ENEMA | Freq: Once | RECTAL | Status: DC | PRN
Start: 2015-06-27 — End: 2015-06-29

## 2015-06-27 MED ORDER — DOCUSATE SODIUM 100 MG PO CAPS
100.0000 mg | ORAL_CAPSULE | Freq: Two times a day (BID) | ORAL | Status: DC
  Administered 2015-06-27 – 2015-06-28 (×2): 100 mg via ORAL
  Filled 2015-06-27 (×2): qty 1

## 2015-06-27 MED ORDER — OXYCODONE HCL 5 MG PO TABS: 5.0000 mg | ORAL_TABLET | Freq: Once | ORAL | Status: DC | PRN

## 2015-06-27 MED ORDER — ZOLPIDEM TARTRATE 5 MG PO TABS: 5.0000 mg | ORAL_TABLET | Freq: Every evening | ORAL | Status: DC | PRN

## 2015-06-27 MED ORDER — BUPIVACAINE LIPOSOME 1.3 % IJ SUSP
INTRAMUSCULAR | Status: DC | PRN
  Administered 2015-06-27: 20 mL

## 2015-06-27 MED ORDER — LIDOCAINE HCL (CARDIAC) 20 MG/ML IV SOLN
INTRAVENOUS | Status: DC | PRN
  Administered 2015-06-27: 80 mg via INTRAVENOUS

## 2015-06-27 SURGICAL SUPPLY — 89 items
ADH SKN CLS APL DERMABOND .7 (GAUZE/BANDAGES/DRESSINGS) ×1
APL SKNCLS STERI-STRIP NONHPOA (GAUZE/BANDAGES/DRESSINGS) ×1
BENZOIN TINCTURE PRP APPL 2/3 (GAUZE/BANDAGES/DRESSINGS) ×3 IMPLANT
BLADE CLIPPER SURG (BLADE) IMPLANT
BONE CANC CHIPS 20CC PCAN1/4 (Bone Implant) ×3 IMPLANT
BUR MATCHSTICK NEURO 3.0 LAGG (BURR) ×3 IMPLANT
BUR PRECISION FLUTE 5.0 (BURR) ×3 IMPLANT
CAGE COROENT LRG 8X9X28M SPINE (Cage) ×4 IMPLANT
CANISTER SUCT 3000ML PPV (MISCELLANEOUS) ×3 IMPLANT
CHIPS CANC BONE 20CC PCAN1/4 (Bone Implant) ×1 IMPLANT
CLOSURE WOUND 1/2 X4 (GAUZE/BANDAGES/DRESSINGS) ×1
CONNECTOR RELINE ROTATE 5-6MM (Connector) ×4 IMPLANT
CONT SPEC 4OZ CLIKSEAL STRL BL (MISCELLANEOUS) ×3 IMPLANT
COVER BACK TABLE 60X90IN (DRAPES) ×3 IMPLANT
DECANTER SPIKE VIAL GLASS SM (MISCELLANEOUS) ×3 IMPLANT
DERMABOND ADVANCED (GAUZE/BANDAGES/DRESSINGS) ×2
DERMABOND ADVANCED .7 DNX12 (GAUZE/BANDAGES/DRESSINGS) ×1 IMPLANT
DRAPE C-ARM 42X72 X-RAY (DRAPES) ×6 IMPLANT
DRAPE C-ARMOR (DRAPES) IMPLANT
DRAPE LAPAROTOMY 100X72X124 (DRAPES) ×3 IMPLANT
DRAPE MICROSCOPE LEICA (MISCELLANEOUS) ×2 IMPLANT
DRAPE POUCH INSTRU U-SHP 10X18 (DRAPES) ×3 IMPLANT
DRAPE SURG 17X23 STRL (DRAPES) ×3 IMPLANT
DRSG OPSITE POSTOP 4X6 (GAUZE/BANDAGES/DRESSINGS) ×2 IMPLANT
DURAPREP 26ML APPLICATOR (WOUND CARE) ×3 IMPLANT
ELECT BLADE 4.0 EZ CLEAN MEGAD (MISCELLANEOUS) ×3
ELECT REM PT RETURN 9FT ADLT (ELECTROSURGICAL) ×3
ELECTRODE BLDE 4.0 EZ CLN MEGD (MISCELLANEOUS) IMPLANT
ELECTRODE REM PT RTRN 9FT ADLT (ELECTROSURGICAL) ×1 IMPLANT
EVACUATOR 1/8 PVC DRAIN (DRAIN) ×7 IMPLANT
GAUZE SPONGE 4X4 12PLY STRL (GAUZE/BANDAGES/DRESSINGS) ×3 IMPLANT
GAUZE SPONGE 4X4 16PLY XRAY LF (GAUZE/BANDAGES/DRESSINGS) IMPLANT
GLOVE BIO SURGEON STRL SZ 6.5 (GLOVE) ×1 IMPLANT
GLOVE BIO SURGEON STRL SZ8 (GLOVE) ×6 IMPLANT
GLOVE BIO SURGEONS STRL SZ 6.5 (GLOVE) ×1
GLOVE BIOGEL PI IND STRL 8 (GLOVE) ×2 IMPLANT
GLOVE BIOGEL PI IND STRL 8.5 (GLOVE) ×2 IMPLANT
GLOVE BIOGEL PI INDICATOR 8 (GLOVE) ×4
GLOVE BIOGEL PI INDICATOR 8.5 (GLOVE) ×4
GLOVE ECLIPSE 7.5 STRL STRAW (GLOVE) ×2 IMPLANT
GLOVE ECLIPSE 8.0 STRL XLNG CF (GLOVE) ×6 IMPLANT
GLOVE EXAM NITRILE LRG STRL (GLOVE) IMPLANT
GLOVE EXAM NITRILE MD LF STRL (GLOVE) IMPLANT
GLOVE EXAM NITRILE XL STR (GLOVE) IMPLANT
GLOVE EXAM NITRILE XS STR PU (GLOVE) IMPLANT
GLOVE INDICATOR 6.5 STRL GRN (GLOVE) ×2 IMPLANT
GLOVE INDICATOR 7.5 STRL GRN (GLOVE) ×2 IMPLANT
GLOVE INDICATOR 8.0 STRL GRN (GLOVE) ×2 IMPLANT
GOWN STRL REUS W/ TWL LRG LVL3 (GOWN DISPOSABLE) IMPLANT
GOWN STRL REUS W/ TWL XL LVL3 (GOWN DISPOSABLE) ×2 IMPLANT
GOWN STRL REUS W/TWL 2XL LVL3 (GOWN DISPOSABLE) ×6 IMPLANT
GOWN STRL REUS W/TWL LRG LVL3 (GOWN DISPOSABLE)
GOWN STRL REUS W/TWL XL LVL3 (GOWN DISPOSABLE) ×6
GRAFT BNE CANC CHIPS 1-8 20CC (Bone Implant) IMPLANT
KIT BASIN OR (CUSTOM PROCEDURE TRAY) ×3 IMPLANT
KIT POSITION SURG JACKSON T1 (MISCELLANEOUS) ×3 IMPLANT
KIT ROOM TURNOVER OR (KITS) ×3 IMPLANT
MILL MEDIUM DISP (BLADE) ×5 IMPLANT
NDL HYPO 25X1 1.5 SAFETY (NEEDLE) ×1 IMPLANT
NDL SPNL 18GX3.5 QUINCKE PK (NEEDLE) IMPLANT
NEEDLE HYPO 25X1 1.5 SAFETY (NEEDLE) ×3 IMPLANT
NEEDLE SPNL 18GX3.5 QUINCKE PK (NEEDLE) IMPLANT
NS IRRIG 1000ML POUR BTL (IV SOLUTION) ×3 IMPLANT
PACK LAMINECTOMY NEURO (CUSTOM PROCEDURE TRAY) ×3 IMPLANT
PAD ARMBOARD 7.5X6 YLW CONV (MISCELLANEOUS) ×9 IMPLANT
PATTIES SURGICAL .5 X.5 (GAUZE/BANDAGES/DRESSINGS) IMPLANT
PATTIES SURGICAL .5 X1 (DISPOSABLE) IMPLANT
PATTIES SURGICAL 1X1 (DISPOSABLE) IMPLANT
ROD RELINE LOROTIC TI 5.5X55MM (Rod) ×2 IMPLANT
ROD RELINE-O LORDOTIC 5.5X60MM (Rod) ×2 IMPLANT
RUBBERBAND STERILE (MISCELLANEOUS) ×4 IMPLANT
SCREW LOCK RELINE 5.5 TULIP (Screw) ×8 IMPLANT
SCREW RELINE-O POLY 6.5X50MM (Screw) ×4 IMPLANT
SPONGE LAP 4X18 X RAY DECT (DISPOSABLE) IMPLANT
SPONGE SURGIFOAM ABS GEL 100 (HEMOSTASIS) ×3 IMPLANT
STAPLER SKIN PROX WIDE 3.9 (STAPLE) ×2 IMPLANT
STRIP CLOSURE SKIN 1/2X4 (GAUZE/BANDAGES/DRESSINGS) ×2 IMPLANT
SUT VIC AB 1 CT1 18XBRD ANBCTR (SUTURE) ×2 IMPLANT
SUT VIC AB 1 CT1 8-18 (SUTURE) ×6
SUT VIC AB 2-0 CT1 18 (SUTURE) ×8 IMPLANT
SUT VIC AB 3-0 SH 8-18 (SUTURE) ×8 IMPLANT
SYR 3ML LL SCALE MARK (SYRINGE) ×8 IMPLANT
SYR 5ML LL (SYRINGE) IMPLANT
TOWEL OR 17X24 6PK STRL BLUE (TOWEL DISPOSABLE) ×3 IMPLANT
TOWEL OR 17X26 10 PK STRL BLUE (TOWEL DISPOSABLE) ×3 IMPLANT
TRAP SPECIMEN MUCOUS 40CC (MISCELLANEOUS) ×3 IMPLANT
TRAY FOLEY W/METER SILVER 14FR (SET/KITS/TRAYS/PACK) ×1 IMPLANT
TRAY FOLEY W/METER SILVER 16FR (SET/KITS/TRAYS/PACK) ×2 IMPLANT
WATER STERILE IRR 1000ML POUR (IV SOLUTION) ×3 IMPLANT

## 2015-06-27 NOTE — Transfer of Care (Signed)
Immediate Anesthesia Transfer of Care Note  Patient: Erik Fletcher  Procedure(s) Performed: Procedure(s): Redo decompression and fusion at Lumbar three-four with exploration of Lumbar four-five  Fusion (N/A)  Patient Location: PACU  Anesthesia Type:General  Level of Consciousness: awake, alert , oriented and patient cooperative  Airway & Oxygen Therapy: Patient Spontanous Breathing and Patient connected to nasal cannula oxygen  Post-op Assessment: Report given to RN, Post -op Vital signs reviewed and stable and Patient moving all extremities X 4  Post vital signs: Reviewed and stable  Last Vitals:  BP 116/72 HR 95 RR 16 SpO2 98 on 2 L [place] Resting comfortably, maintains good airway, denies pain.   Complications: No apparent anesthesia complications

## 2015-06-27 NOTE — Brief Op Note (Signed)
06/27/2015  6:04 PM  PATIENT:  Erik Fletcher  65 y.o. 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 with lumbago L 34 with prior fusion L 45 level  POST-OPERATIVE DIAGNOSIS:  Radiculopathy, Lumbar region with stenosis and herniated disc with lumbago L 34 with prior fusion L 45 level  PROCEDURE:  Procedure(s): Redo decompression and fusion at Lumbar three-four with exploration of Lumbar four-five  Fusion (N/A) with PEEK interbody cages, posterolateral arthrodesis and pedicle screw fixation  Decompression far greater than standard PLIF procedure  SURGEON:  Surgeon(s) and Role:    * Erline Levine, MD - Primary    * Consuella Lose, MD - Assisting  PHYSICIAN ASSISTANT:   ASSISTANTS: [place], RN   ANESTHESIA:   general  EBL:  Total I/O In: 2650 [I.V.:2400; IV Piggyback:250] Out: [id number] [Urine:215; Blood:700]  BLOOD ADMINISTERED:none  DRAINS: (Medium) Hemovact drain(s) in the epidural space with  Suction Open   LOCAL MEDICATIONS USED:  MARCAINE    and LIDOCAINE   SPECIMEN:  No Specimen  DISPOSITION OF SPECIMEN:  N/A  COUNTS:  YES  TOURNIQUET:  * No tourniquets in log *  DICTATION: DICTATION: Patient is 65 year old man with recurrent lumbar stenosis at L 34 and a large disc herniation at L 34 on the left and previous fusion L 45 level with severe back and left leg pain.  It was elected to take him to surgery for exploration of previous fusion with redo decompression and fusion from L 34 levels.   Procedure: Patient was placed in a prone position on the Leary table after smooth and uncomplicated induction of general endotracheal anesthesia. His low back was prepped and draped in usual sterile fashion with betadine scrub and DuraPrep. Area of incision was infiltrated with local lidocaine. Incision was made to the lumbodorsal fascia was incised and exposure was performed of the L 3 - L 5 spinous processes laminae facet  joint and transverse processes. Previous hardware was exposed.  The screws appeared to be solidly in bone, although there did not appear to be a great deal of lateral bone growth.   The bone screws were preserved in place and connectors were placed on the rods .  Intraoperative x-ray was obtained which confirmed correct orientation with marker probes at L3  Through L 4 levels. A total laminectomy of L 3 through L 4  levels was performed with disarticulation of the facet joints and thorough decompression was performed of both  L 3  L 4  nerve roots along with the common dural tube. Decompression was greater than would be typical for PLIF. The microscope was brought into the field and thorough microdiscectomy of L 34 was performed on the left with removal of multiple free fragments of disc and ridges at L 4.  A thorough discectomy with removal  Of free fragments at L3/4  was performed with removal of a large free fragment behind the L 4 vertebral body. A thorough discectomy was then performed on the left with preparation of the endplates for grafting a trial spacer was placed this level and a thorough discectomy was performed on the right as well at L 34.  After trial sizing and utilization of sequential shavers, interspaces were packed with autograft, allograft and 25mm  X 28 x 8 degree lordotic PEEK cages with 10 cc of autograft in the intrerspace.  The posterolateral region was extensively decorticated and pedicle probes were placed at L 3  bilaterally. Intraoperative fluoroscopy  confirmed correct orientationin the AP and lateral plane. 50 x 6.5 mm pedicle screws were placed at L 3 bilaterally.  Final x-rays demonstrated well-positioned interbody grafts and pedicle screw fixation. A 60 mm lordotic rod was placed on the right and a 55 mm rod was placed on the left locked down in situ and the posterolateral region was packed with 10 cc bone graft on the right and  a similar amount on the left. The wound was irrigated.  A medium Hemovac drain was placed. Fascia was closed with 1 Vicryl sutures skin edges were reapproximated 2 and 3-0 Vicryl sutures. The wound was dressed with Dermabond and  an occlusive dressing the patient was extubated in the operating room and taken to recovery in stable satisfactory condition she tolerated traction well counts were correct at the end of the case.   PLAN OF CARE: Admit to inpatient   PATIENT DISPOSITION:  PACU - hemodynamically stable.   Delay start of Pharmacological VTE agent (>24hrs) due to surgical blood loss or risk of bleeding: yes

## 2015-06-27 NOTE — Anesthesia Preprocedure Evaluation (Signed)
Anesthesia Evaluation  Patient identified by MRN, date of birth, ID band Patient awake    Reviewed: Allergy & Precautions, H&P , NPO status , Patient's Chart, lab work & pertinent test results  History of Anesthesia Complications (+) PONV and history of anesthetic complications  Airway Mallampati: I  TM Distance: >3 FB Neck ROM: Full    Dental  (+) Teeth Intact, Dental Advisory Given   Pulmonary neg pulmonary ROS,    Pulmonary exam normal breath sounds clear to auscultation       Cardiovascular negative cardio ROS Normal cardiovascular exam Rhythm:Regular Rate:Normal     Neuro/Psych Chronic back pain    GI/Hepatic negative GI ROS, Neg liver ROS,   Endo/Other    Renal/GU negative Renal ROS     Musculoskeletal   Abdominal (+) + obese,   Peds  Hematology   Anesthesia Other Findings Has glaucoma  Reproductive/Obstetrics                             Anesthesia Physical  Anesthesia Plan  ASA: III  Anesthesia Plan: General   Post-op Pain Management:    Induction: Intravenous  Airway Management Planned: Oral ETT  Additional Equipment:   Intra-op Plan:   Post-operative Plan: Extubation in OR  Informed Consent: I have reviewed the patients History and Physical, chart, labs and discussed the procedure including the risks, benefits and alternatives for the proposed anesthesia with the patient or authorized representative who has indicated his/her understanding and acceptance.   Dental advisory given  Plan Discussed with: Surgeon, CRNA and Anesthesiologist  Anesthesia Plan Comments: (Previous Grade 1 intubation with Miller blade, 7.5 ETT)        Anesthesia Quick Evaluation

## 2015-06-27 NOTE — Anesthesia Postprocedure Evaluation (Signed)
Anesthesia Post Note  Patient: Erik Fletcher  Procedure(s) Performed: Procedure(s) (LRB): Redo decompression and fusion at Lumbar three-four with exploration of Lumbar four-five  Fusion (N/A)  Patient location during evaluation: PACU Anesthesia Type: General Level of consciousness: awake and alert Pain management: pain level controlled Vital Signs Assessment: post-procedure vital signs reviewed and stable Respiratory status: spontaneous breathing, nonlabored ventilation and respiratory function stable Cardiovascular status: blood pressure returned to baseline and stable Postop Assessment: no signs of nausea or vomiting Anesthetic complications: no    Last Vitals:  Filed Vitals:   06/27/15 1844 06/27/15 1904  BP:  111/58  Pulse:    Temp: 36.6 C 36.4 C  Resp:  18    Last Pain:  Filed Vitals:   06/27/15 1915  PainSc: 3                  Latanza Pfefferkorn,W. EDMOND

## 2015-06-27 NOTE — Progress Notes (Signed)
Orthopedic Tech Progress Note Patient Details:  Erik Fletcher 08-06-1950 [id number] Patient already has brace. Patient ID: Erik Fletcher, male   DOB: 03/21/1951, 65 y.o.   MRN: [id number]   [name] 06/27/2015, 8:48 PM

## 2015-06-27 NOTE — Progress Notes (Signed)
Awake, alert, conversant.  MAEW with good strength.  Doing well. 

## 2015-06-27 NOTE — Anesthesia Procedure Notes (Signed)
Procedure Name: Intubation Date/Time: 06/27/2015 2:35 PM Performed by: Rush Farmer E Pre-anesthesia Checklist: Patient identified, Emergency Drugs available, Suction available and Patient being monitored Patient Re-evaluated:Patient Re-evaluated prior to inductionOxygen Delivery Method: Circle system utilized Preoxygenation: Pre-oxygenation with 100% oxygen Intubation Type: IV induction Ventilation: Mask ventilation without difficulty and Oral airway inserted - appropriate to patient size Laryngoscope Size: Mac and 4 Grade View: Grade II Tube type: Oral Tube size: 7.5 mm Number of attempts: 1 Airway Equipment and Method: Stylet Placement Confirmation: ETT inserted through vocal cords under direct vision,  positive ETCO2 and breath sounds checked- equal and bilateral Secured at: 23 cm Tube secured with: Tape Dental Injury: Teeth and Oropharynx as per pre-operative assessment

## 2015-06-27 NOTE — Op Note (Signed)
06/27/2015  6:04 PM  PATIENT:  Erik Fletcher  65 y.o. 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 with lumbago L 34 with prior fusion L 45 level  POST-OPERATIVE DIAGNOSIS:  Radiculopathy, Lumbar region with stenosis and herniated disc with lumbago L 34 with prior fusion L 45 level  PROCEDURE:  Procedure(s): Redo decompression and fusion at Lumbar three-four with exploration of Lumbar four-five  Fusion (N/A) with PEEK interbody cages, posterolateral arthrodesis and pedicle screw fixation  Decompression far greater than standard PLIF procedure  SURGEON:  Surgeon(s) and Role:    * Erline Levine, MD - Primary    * Consuella Lose, MD - Assisting  PHYSICIAN ASSISTANT:   ASSISTANTS: Poteat, RN   ANESTHESIA:   general  EBL:  Total I/O In: 2650 [I.V.:2400; IV Piggyback:250] Out: [id number] [Urine:215; Blood:700]  BLOOD ADMINISTERED:none  DRAINS: (Medium) Hemovact drain(s) in the epidural space with  Suction Open   LOCAL MEDICATIONS USED:  MARCAINE    and LIDOCAINE   SPECIMEN:  No Specimen  DISPOSITION OF SPECIMEN:  N/A  COUNTS:  YES  TOURNIQUET:  * No tourniquets in log *  DICTATION: DICTATION: Patient is 65 year old man with recurrent lumbar stenosis at L 34 and a large disc herniation at L 34 on the left and previous fusion L 45 level with severe back and left leg pain.  It was elected to take him to surgery for exploration of previous fusion with redo decompression and fusion from L 34 levels.   Procedure: Patient was placed in a prone position on the Filer table after smooth and uncomplicated induction of general endotracheal anesthesia. His low back was prepped and draped in usual sterile fashion with betadine scrub and DuraPrep. Area of incision was infiltrated with local lidocaine. Incision was made to the lumbodorsal fascia was incised and exposure was performed of the L 3 - L 5 spinous processes laminae facet  joint and transverse processes. Previous hardware was exposed.  The screws appeared to be solidly in bone, although there did not appear to be a great deal of lateral bone growth.   The bone screws were preserved in place and connectors were placed on the rods .  Intraoperative x-ray was obtained which confirmed correct orientation with marker probes at L3  Through L 4 levels. A total laminectomy of L 3 through L 4  levels was performed with disarticulation of the facet joints and thorough decompression was performed of both  L 3  L 4  nerve roots along with the common dural tube. Decompression was greater than would be typical for PLIF. The microscope was brought into the field and thorough microdiscectomy of L 34 was performed on the left with removal of multiple free fragments of disc and ridges at L 4.  A thorough discectomy with removal  Of free fragments at L3/4  was performed with removal of a large free fragment behind the L 4 vertebral body. A thorough discectomy was then performed on the left with preparation of the endplates for grafting a trial spacer was placed this level and a thorough discectomy was performed on the right as well at L 34.  After trial sizing and utilization of sequential shavers, interspaces were packed with autograft, allograft and 42mm  X 28 x 8 degree lordotic PEEK cages with 10 cc of autograft in the intrerspace.  The posterolateral region was extensively decorticated and pedicle probes were placed at L 3  bilaterally. Intraoperative fluoroscopy  confirmed correct orientationin the AP and lateral plane. 50 x 6.5 mm pedicle screws were placed at L 3 bilaterally.  Final x-rays demonstrated well-positioned interbody grafts and pedicle screw fixation. A 60 mm lordotic rod was placed on the right and a 55 mm rod was placed on the left locked down in situ and the posterolateral region was packed with 10 cc bone graft on the right and  a similar amount on the left. The wound was irrigated.  A medium Hemovac drain was placed. Fascia was closed with 1 Vicryl sutures skin edges were reapproximated 2 and 3-0 Vicryl sutures. The wound was dressed with Dermabond and  an occlusive dressing the patient was extubated in the operating room and taken to recovery in stable satisfactory condition she tolerated traction well counts were correct at the end of the case.   PLAN OF CARE: Admit to inpatient   PATIENT DISPOSITION:  PACU - hemodynamically stable.   Delay start of Pharmacological VTE agent (>24hrs) due to surgical blood loss or risk of bleeding: yes

## 2015-06-27 NOTE — Interval H&P Note (Signed)
History and Physical Interval Note:  06/27/2015 7:08 AM  Erik Fletcher  has presented today for surgery, with the diagnosis of Radiculopathy, Lumbar region  The various methods of treatment have been discussed with the patient and family. After consideration of risks, benefits and other options for treatment, the patient has consented to  Procedure(s) with comments: Redo decompression and fusion at L3-4 with exploration of L4-5 Fusion (N/A) - Redo decompression and fusion at L3-4 with exploration of L4-5 Fusion as a surgical intervention .  The patient's history has been reviewed, patient examined, no change in status, stable for surgery.  I have reviewed the patient's chart and labs.  Questions were answered to the patient's satisfaction.     Eniola Cerullo D

## 2015-06-28 LAB — CBC
HEMATOCRIT: 35.4 % — AB (ref 39.0–52.0)
Hemoglobin: 12 g/dL — ABNORMAL LOW (ref 13.0–17.0)
MCH: 31.2 pg (ref 26.0–34.0)
MCHC: 33.9 g/dL (ref 30.0–36.0)
MCV: 91.9 fL (ref 78.0–100.0)
PLATELETS: 177 10*3/uL (ref 150–400)
RBC: 3.85 MIL/uL — ABNORMAL LOW (ref 4.22–5.81)
RDW: 12.5 % (ref 11.5–15.5)
WBC: 9.7 10*3/uL (ref 4.0–10.5)

## 2015-06-28 LAB — GLUCOSE, CAPILLARY: Glucose-Capillary: 156 mg/dL — ABNORMAL HIGH (ref 65–99)

## 2015-06-28 MED ORDER — HYDROCODONE-ACETAMINOPHEN 5-325 MG PO TABS: 1.0000 | ORAL_TABLET | ORAL | Status: DC | PRN

## 2015-06-28 MED ORDER — PANTOPRAZOLE SODIUM 40 MG PO TBEC
40.0000 mg | DELAYED_RELEASE_TABLET | Freq: Every day | ORAL | Status: DC
  Administered 2015-06-28: 40 mg via ORAL
  Filled 2015-06-28: qty 1

## 2015-06-28 MED ORDER — METHOCARBAMOL 500 MG PO TABS: 500.0000 mg | ORAL_TABLET | Freq: Four times a day (QID) | ORAL | Status: DC | PRN

## 2015-06-28 NOTE — Progress Notes (Signed)
Subjective: Patient reports doing well  Objective: Vital signs in last 24 hours: Temp:  [97.5 F (36.4 C)-99 F (37.2 C)] 98.2 F (36.8 C) (01/04 0805) Pulse Rate:  [65-99] 65 (01/04 0805) Resp:  [8-20] 18 (01/04 0805) BP: (100-126)/(53-82) 115/65 mmHg (01/04 0805) SpO2:  [97 %-100 %] 100 % (01/04 0805) Weight:  [104.736 kg (230 lb 14.4 oz)] 104.736 kg (230 lb 14.4 oz) (01/03 1141)  Intake/Output from previous day: 01/03 0701 - 01/04 0700 In: 2890 [P.O.:240; I.V.:2400; IV Piggyback:250] Out: [id number] [Urine:415; Emesis/NG output:100; Drains:530; Blood:700] Intake/Output this shift:    Physical Exam: Full strength, c/o numbness in left thigh  Lab Results:  Recent Labs  06/28/15 0251  WBC 9.7  HGB 12.0*  HCT 35.4*  PLT 177   BMET No results for input(s): NA, K, CL, CO2, GLUCOSE, BUN, CREATININE, CALCIUM in the last 72 hours.  Studies/Results: Dg Lumbar Spine 2-3 Views  06/27/2015  CLINICAL DATA:  65 year old male with a history of posterior lumbar interbody fusion EXAM: LUMBAR SPINE - 2-3 VIEW; DG C-ARM 61-120 MIN COMPARISON:  None. FINDINGS: Intraoperative fluoroscopic spot images during posterior lumbar interbody fusion. Note that the sacrum is not visualized, and the numbering system has been assigned using the electronic history of fusion spanning L3-L5. Surgical changes of posterior lumbar interbody fusion with bilateral pedicle screw fixation of the L3-L4 and L4-L5 levels. Interbody spacers at L3-L4 and L4-L5 well position. Lumbar rods not in position. IMPRESSION: Interoperative fluoroscopic spot images during posterior lumbar interbody fusion, with bilateral pedicle screws in place at the L3, L4 and L5 levels, as well as interbody spacers well positioned at L3-L4 and L4-L5. Please refer to the dictated operative report for full details of intraoperative findings and procedure. Signed, Dulcy Fanny. Earleen Newport, DO [facility]  Electronically Signed   By: Corrie Mckusick D.O.   On: 06/27/2015 18:20   Dg Lumbar Spine 1 View  06/27/2015  CLINICAL DATA:  Re-do PLIF L3-4 and explore L4-5 fusion. EXAM: LUMBAR SPINE - 1 VIEW COMPARISON:  05/30/2014 lumbar spine radiographs. FINDINGS: Portable cross-table lateral intraoperative lumbar spine radiograph demonstrates the tip of a posterior approach surgical marking device overlying the posterior spinal canal at the L3 level. Bone cages partially visualized in the L4-5 disc space. Partially visualized is prior posterior fusion hardware at L4-5. IMPRESSION: Posterior approach surgical marking device terminates over the posterior spinal canal at the L3 level. Electronically Signed   By: Ilona Sorrel M.D.   On: 06/27/2015 16:04   Dg C-arm 1-60 Min  06/27/2015  CLINICAL DATA:  65 year old male with a history of posterior lumbar interbody fusion EXAM: LUMBAR SPINE - 2-3 VIEW; DG C-ARM 61-120 MIN COMPARISON:  None. FINDINGS: Intraoperative fluoroscopic spot images during posterior lumbar interbody fusion. Note that the sacrum is not visualized, and the numbering system has been assigned using the electronic history of fusion spanning L3-L5. Surgical changes of posterior lumbar interbody fusion with bilateral pedicle screw fixation of the L3-L4 and L4-L5 levels. Interbody spacers at L3-L4 and L4-L5 well position. Lumbar rods not in position. IMPRESSION: Interoperative fluoroscopic spot images during posterior lumbar interbody fusion, with bilateral pedicle screws in place at the L3, L4 and L5 levels, as well as interbody spacers well positioned at L3-L4 and L4-L5. Please refer to the dictated operative report for full details of intraoperative findings and procedure. Signed, Dulcy Fanny. Earleen Newport, DO [facility] Electronically Signed   By: Corrie Mckusick  D.O.   On: 06/27/2015 18:20    Assessment/Plan: Doing well POD 1.  Drain output still high.   Observe today, possibly discharge this pm or may stay to am, depending on mobility and drain output.    LOS: 1 day    Peggyann Shoals, MD 06/28/2015, 8:09 AM

## 2015-06-28 NOTE — Discharge Summary (Signed)
Physician Discharge Summary  Patient ID: [name] MRN: [id number] DOB/AGE: Mar 06, 1951 65 y.o.  Admit date: 06/27/2015 Discharge date: 06/28/2015  Admission Diagnoses:Herniated lumbar disc with recurrent spinal stenosis and radiculopathy L 34 level with lumbago  Discharge Diagnoses:Herniated lumbar disc with recurrent spinal stenosis and radiculopathy L 34 level with lumbago   Active Problems:   Lumbar disc disease with radiculopathy   Discharged Condition: good  Hospital Course: Patient underwent exploration of fusion L 45 and decompression and fusion L 34 with pedicle screw fixation and posterolateral arthrodesis.  He did well with surgery and was mobilizing well on POD 1.  Consults: None  Significant Diagnostic Studies: None  Treatments: surgery: exploration of fusion L 45 and decompression and fusion L 34 with pedicle screw fixation and posterolateral arthrodesis.  Discharge Exam: Blood pressure 115/65, pulse 65, temperature 98.2 F (36.8 C), temperature source Oral, resp. rate 18, height 5\' 9"  (1.753 m), weight 104.736 kg (230 lb 14.4 oz), SpO2 100 %. Neurologic: Alert and oriented X 3, normal strength and tone. Normal symmetric reflexes. Normal coordination and gait Wound:CDI  Disposition: Home     Medication List    STOP taking these medications        ibuprofen 200 MG tablet  Commonly known as:  ADVIL,MOTRIN      TAKE these medications        HYDROcodone-acetaminophen 5-325 MG tablet  Commonly known as:  NORCO/VICODIN  Take 1-2 tablets by mouth every 4 (four) hours as needed (mild pain).     latanoprost 0.005 % ophthalmic solution  Commonly known as:  XALATAN  Place 1 drop into the right eye at bedtime.     methocarbamol 500 MG tablet  Commonly known as:  ROBAXIN  Take 1 tablet (500 mg total) by mouth every 6 (six) hours as needed for muscle spasms.     oxyCODONE-acetaminophen 5-325 MG tablet  Commonly known as:  PERCOCET/ROXICET  Take 1-2  tablets by mouth every 4 (four) hours as needed.     RESTASIS 0.05 % ophthalmic emulsion  Generic drug:  cycloSPORINE  Place 1 drop into both eyes 2 (two) times daily.           Follow-up Information    Follow up with Peggyann Shoals, MD.   Specialty:  Neurosurgery   Contact information:   [address] [place] [address] [phone]       Signed: Peggyann Shoals, MD 06/28/2015, 8:11 AM

## 2015-06-28 NOTE — Progress Notes (Signed)
Noted patient's bloody output from hemovac with   06/27/15 2356  Vitals  Temp 98.6 F (37 C)  Temp Source Oral  BP (!) 105/56 mmHg  BP Location Right Arm  BP Method Automatic  Patient Position (if appropriate) Lying  Pulse Rate 84  Pulse Rate Source Monitor  Resp 20   significant amt- 110cc @ 1910, 180cc at 2250, and another 120 cc before midnight. On call MD Dr Kathyrn Sheriff made aware. New order received: CBC in am. Pt  Asymptomatic.

## 2015-06-28 NOTE — Evaluation (Signed)
Physical Therapy Evaluation Patient Details Name: Erik Fletcher MRN: [id number] DOB: [birth year] Today's Date: 06/28/2015   History of Present Illness  65 yo male s/p L3-4 redo decompression and fusion. PMH: glaucoma back surg x2  Clinical Impression  Pt admitted with above diagnosis. Pt currently with functional limitations due to the deficits listed below (see PT Problem List). At the time of PT eval pt was able to perform transfers and ambulation with supervision for safety. Pt required increased cues for log roll technique and for safety when donning brace. Pt will benefit from skilled PT to increase their independence and safety with mobility to allow discharge to the venue listed below.      Follow Up Recommendations Outpatient PT    Equipment Recommendations  None recommended by PT    Recommendations for Other Services       Precautions / Restrictions Precautions Precautions: Back Precaution Comments: handout provided and reviewed in detail Required Braces or Orthoses: Spinal Brace Spinal Brace: Lumbar corset;Applied in sitting position (Pt insiting on applying in standing despite education) Restrictions Weight Bearing Restrictions: No      Mobility  Bed Mobility Overal bed mobility: Needs Assistance Bed Mobility: Rolling;Sidelying to Sit Rolling: Supervision Sidelying to sit: Supervision       General bed mobility comments: Pt sitting straight up to get out of bed after even after reviewing log roll technique. States "it is fine" but therapist insisted pt practice again and pt still not utilizing full log roll. Was able to complete properly on third attempt.  Transfers Overall transfer level: Needs assistance Equipment used: None Transfers: Sit to/from Stand Sit to Stand: Supervision         General transfer comment: Supervision for safety.   Ambulation/Gait Ambulation/Gait assistance: Supervision Ambulation Distance (Feet): 500 Feet Assistive  device: None Gait Pattern/deviations: Step-through pattern;Decreased stride length;Trunk flexed Gait velocity: Decreased Gait velocity interpretation: Below normal speed for age/gender General Gait Details: Slightly antalgic due to weakness on the L side. No LOB noted and no assist required.   Stairs Stairs: Yes Stairs assistance: Min guard Stair Management: One rail Right;Alternating pattern;Forwards Number of Stairs: 10 General stair comments: VC's for sequencing and safety.   Wheelchair Mobility    Modified Rankin (Stroke Patients Only)       Balance Overall balance assessment: No apparent balance deficits (not formally assessed)                                           Pertinent Vitals/Pain Pain Assessment: Faces Faces Pain Scale: Hurts a little bit Pain Location: Surgical site Pain Descriptors / Indicators: Operative site guarding;Discomfort Pain Intervention(s): Limited activity within patient's tolerance;Monitored during session;Repositioned    Home Living Family/patient expects to be discharged to:: Private residence Living Arrangements: Spouse/significant other Available Help at Discharge: Family;Available 24 hours/day Type of Home: House Home Access: Stairs to enter Entrance Stairs-Rails: Right Entrance Stairs-Number of Steps: 5 total Home Layout: One level Home Equipment: Walker - 2 wheels;Toilet riser;Adaptive equipment      Prior Function Level of Independence: Independent               Hand Dominance   Dominant Hand: Left    Extremity/Trunk Assessment   Upper Extremity Assessment: Defer to OT evaluation           Lower Extremity Assessment: LLE deficits/detail   LLE Deficits / Details:  Weakness from pre-surgical symptoms  Cervical / Trunk Assessment: Other exceptions (s/p surg)  Communication   Communication: No difficulties  Cognition Arousal/Alertness: Awake/alert Behavior During Therapy: WFL for tasks  assessed/performed Overall Cognitive Status: Within Functional Limits for tasks assessed                      General Comments      Exercises        Assessment/Plan    PT Assessment Patient needs continued PT services  PT Diagnosis Difficulty walking;Acute pain   PT Problem List Decreased strength;Decreased range of motion;Decreased activity tolerance;Decreased balance;Decreased mobility;Decreased knowledge of use of DME;Decreased safety awareness;Decreased knowledge of precautions;Pain  PT Treatment Interventions DME instruction;Gait training;Stair training;Functional mobility training;Therapeutic activities;Therapeutic exercise;Neuromuscular re-education;Patient/family education   PT Goals (Current goals can be found in the Care Plan section) Acute Rehab PT Goals Patient Stated Goal: none stated PT Goal Formulation: With patient Time For Goal Achievement: 07/05/15 Potential to Achieve Goals: Good    Frequency Min 5X/week   Barriers to discharge        Co-evaluation               End of Session Equipment Utilized During Treatment: Back brace Activity Tolerance: Patient tolerated treatment well Patient left: in chair;with call bell/phone within reach Nurse Communication: Mobility status         Time: [id number] PT Time Calculation (min) (ACUTE ONLY): 18 min   Charges:   PT Evaluation $PT Eval Moderate Complexity: 1 Procedure     PT G Codes:        Rolinda Roan 2015-07-08, 1:40 PM   Rolinda Roan, PT, DPT Acute Rehabilitation Services Pager: [phone]

## 2015-06-28 NOTE — Evaluation (Signed)
Occupational Therapy Evaluation Patient Details Name: Erik Fletcher MRN: [id number] DOB: 11/14/1950 Today's Date: 06/28/2015    History of Present Illness 65 yo male s/p L3-4 redo decompression and fusion. PMH: glaucoma back surg x2   Clinical Impression   Patient is s/p L4-5 fusion surgery resulting in functional limitations due to the deficits listed below (see OT problem list). PTA independent with adls.  Patient will benefit from skilled OT acutely to increase independence and safety with ADLS to allow discharge home without needs.     Follow Up Recommendations  No OT follow up    Equipment Recommendations  None recommended by OT    Recommendations for Other Services       Precautions / Restrictions Precautions Precautions: Back Precaution Comments: hadnout provided and reviewed in detail Required Braces or Orthoses: Spinal Brace Spinal Brace: Lumbar corset;Applied in sitting position      Mobility Bed Mobility               General bed mobility comments: pt able to verbalize 100% correct and reports feeling better after getting out of bed  Transfers Overall transfer level: Needs assistance   Transfers: Sit to/from Stand Sit to Stand: Supervision         General transfer comment: requires use of chair arm rest    Balance                                            ADL Overall ADL's : Needs assistance/impaired Eating/Feeding: Independent   Grooming: Wash/dry hands;Wash/dry face;Supervision/safety       Lower Body Bathing: Min guard;Sitting/lateral leans       Lower Body Dressing: Min guard Lower Body Dressing Details (indicate cue type and reason): pt required bil Ue (A) to bring L LE up to practice LB. pt has AE at home and previous education on LB dressing. pt educated to dress L first. Pt states "that is good i havent thought of that" Toilet Transfer: Supervision/safety;Ambulation;Comfort height toilet Toilet  Transfer Details (indicate cue type and reason): requires use of BIL UE   Toileting - Clothing Manipulation Details (indicate cue type and reason): educated on pulling up pants completely and avoid bending with flushing     Functional mobility during ADLs: Supervision/safety General ADL Comments: Pt with all education complete on back handout and daily routine. Pt educated verbally on tub transfer but did not return demo. Ot demo. Pt positioned in chair with breakfast tray     Vision     Perception     Praxis      Pertinent Vitals/Pain Pain Assessment: 0-10 Pain Score: 2  Pain Location: surgerical site Pain Intervention(s): Repositioned;Premedicated before session;Monitored during session     Hand Dominance Left   Extremity/Trunk Assessment Upper Extremity Assessment Upper Extremity Assessment: Overall WFL for tasks assessed   Lower Extremity Assessment Lower Extremity Assessment: Defer to PT evaluation;LLE deficits/detail LLE Deficits / Details: difficulty with LB dressing   Cervical / Trunk Assessment Cervical / Trunk Assessment: Other exceptions (s/p surg)   Communication Communication Communication: No difficulties   Cognition Arousal/Alertness: Awake/alert Behavior During Therapy: WFL for tasks assessed/performed Overall Cognitive Status: Within Functional Limits for tasks assessed                     General Comments       Exercises  Shoulder Instructions      Home Living Family/patient expects to be discharged to:: Private residence Living Arrangements: Spouse/significant other Available Help at Discharge: Family;Available 24 hours/day Type of Home: House Home Access: Stairs to enter [employer] of Steps: 3 (then up on porch and then into house ( 5 single leg standing) Entrance Stairs-Rails: Right Home Layout: One level     Bathroom Shower/Tub: Tub/shower unit Shower/tub characteristics: Curtain Biochemist, clinical:  Handicapped height     Home Equipment: Environmental consultant - 2 wheels;Toilet riser;Adaptive equipment Adaptive Equipment: Reacher;Sock aid;Long-handled shoe horn;Long-handled sponge        Prior Functioning/Environment Level of Independence: Independent             OT Diagnosis: Generalized weakness;Acute pain   OT Problem List: Decreased strength;Decreased activity tolerance;Impaired balance (sitting and/or standing);Decreased safety awareness;Decreased knowledge of use of DME or AE;Decreased knowledge of precautions;Pain   OT Treatment/Interventions: Self-care/ADL training;Therapeutic exercise;DME and/or AE instruction;Therapeutic activities;Balance training;Patient/family education    OT Goals(Current goals can be found in the care plan section) Acute Rehab OT Goals Patient Stated Goal: none stated OT Goal Formulation: With patient Time For Goal Achievement: 07/12/15 Potential to Achieve Goals: Good  OT Frequency: Min 2X/week   Barriers to D/C:            Co-evaluation              End of Session Equipment Utilized During Treatment: Back brace Nurse Communication: Mobility status;Precautions  Activity Tolerance: Patient tolerated treatment well Patient left: in chair;with call bell/phone within reach   Time: [id number] OT Time Calculation (min): 20 min Charges:  OT General Charges $OT Visit: 1 Procedure OT Evaluation $OT Eval Low Complexity: 1 Procedure G-Codes:    Parke Poisson B 07-01-15, 9:24 AM   Jeri Modena   OTR/L [phone] Office: [phone] .

## 2015-06-28 NOTE — Progress Notes (Signed)
Utilization review completed.  

## 2015-06-29 NOTE — Progress Notes (Signed)
Pt doing well. Pt given D/C instructions with Rx's, verbal understanding was provided. Pt's incision is clean and dry with no sign of infection. Pt's IV and Hemovac were removed prior to D/C. Pt D/C'd home via walking @ 1040 per MD order. Pt is stable @ D/C and has no other needs at this time. Holli Humbles, RN

## 2015-06-29 NOTE — Progress Notes (Signed)
Occupational Therapy Treatment Patient Details Name: JANI BROUHARD MRN: [id number] DOB: [birth year] Today's Date: 06/29/2015    History of present illness 65 yo male s/p L3-4 redo decompression and fusion. PMH: glaucoma back surg x2   OT comments  Patient is progressing towards OT goals. Pt is motivated to go home today. Pt did practice simulated tub/shower transfer, stepping over tub threshold with LLE first, then RLE. Therapist discussed use of AE to increase independence with LB ADLs and importance of adhering to back precautions at all times, even ADLs and functional tasks. Pt able to verbalize no bending, no arching, no twisting, no lifting. Pt did require cues to maintain precautions during functional mobility at times.    Follow Up Recommendations  No OT follow up    Equipment Recommendations  None recommended by OT    Recommendations for Other Services  None at this time   Precautions / Restrictions Precautions Precautions: Back;Fall Precaution Booklet Issued: Yes (comment) Precaution Comments: Reviewed precautions during functional mobility and transfers, pt able to verbalize 4/4 Required Braces or Orthoses: Spinal Brace Spinal Brace: Lumbar corset;Applied in sitting position Restrictions Weight Bearing Restrictions: No    Mobility Bed Mobility - Per PT note Overal bed mobility: Needs Assistance Bed Mobility: Rolling;Sidelying to Sit Rolling: Modified independent (Device/Increase time) Sidelying to sit: Modified independent (Device/Increase time) General bed mobility comments: Increased time to achieve EOB. Pt demonstrated proper log roll with cueing for technique.   Transfers Overall transfer level: Needs assistance Equipment used: None Transfers: Sit to/from Stand Sit to Stand: Supervision General transfer comment: Supervision for safety.     Balance Overall balance assessment: Needs assistance Sitting-balance support: No upper extremity supported;Feet  supported Sitting balance-Leahy Scale: Good     Standing balance support: No upper extremity supported;During functional activity Standing balance-Leahy Scale: Fair   ADL Overall ADL's : Needs assistance/impaired General ADL Comments: Pt overall supervision for ADLs and functional mobility, not using an AD. Pt states he has AE at home (reacher, sock aid, LH sponge, LH shoe horn). Pt is able to cross BLEs for LB ADLs. Pt engaged in simulated tub/shower transfer and is supervision for this as well. Patient has wife at home to assist/supervise as needed.      Cognition   Behavior During Therapy: WFL for tasks assessed/performed Overall Cognitive Status: Within Functional Limits for tasks assessed                 Pertinent Vitals/ Pain       Pain Assessment: Faces Faces Pain Scale: Hurts a little bit Pain Location: surgical site Pain Descriptors / Indicators: Operative site guarding;Discomfort Pain Intervention(s): Monitored during session   Frequency Min 2X/week     Progress Toward Goals  OT Goals(current goals can now befound in the care plan section)  Progress towards OT goals: Progressing toward goals  Acute Rehab OT Goals Patient Stated Goal: go home today  Plan Discharge plan remains appropriate    End of Session Equipment Utilized During Treatment: Back brace   Activity Tolerance Patient tolerated treatment well   Patient Left in chair;with call bell/phone within reach  Nurse Communication Other (comment) (discussed showering with bandage)     Time: [id number] OT Time Calculation (min): 14 min  Charges: OT Treatments $Self Care/Home Management : 8-22 mins  Chrys Racer , MS, OTR/L, CLT Pager: [phone]  06/29/2015, 9:25 AM

## 2015-06-29 NOTE — Progress Notes (Signed)
Physical Therapy Treatment Patient Details Name: Erik Fletcher MRN: [id number] DOB: [birth year] Today's Date: 06/29/2015    History of Present Illness 65 yo male s/p L3-4 redo decompression and fusion. PMH: glaucoma back surg x2    PT Comments    Pt progressing towards physical therapy goals. Was able to perform bed mobility and brace application with improved technique this session and did not require physical assist throughout functional mobility. Pt occasionally demonstrated staggering during gait training however did not require assist to recover. Recommended use of a walking stick once he begins community ambulation (pt has one at home). Will continue to follow and progress as able per POC.   Follow Up Recommendations  Outpatient PT     Equipment Recommendations  None recommended by PT    Recommendations for Other Services       Precautions / Restrictions Precautions Precautions: Back;Fall Precaution Booklet Issued: Yes (comment) Precaution Comments: Reviewed precautions during functional mobility and transfers Required Braces or Orthoses: Spinal Brace Spinal Brace: Lumbar corset;Applied in sitting position Restrictions Weight Bearing Restrictions: No    Mobility  Bed Mobility Overal bed mobility: Needs Assistance Bed Mobility: Rolling;Sidelying to Sit Rolling: Modified independent (Device/Increase time) Sidelying to sit: Modified independent (Device/Increase time)       General bed mobility comments: Increased time to achieve EOB. Pt demonstrated proper log roll with cueing for technique.   Transfers Overall transfer level: Needs assistance Equipment used: None Transfers: Sit to/from Stand Sit to Stand: Supervision         General transfer comment: Supervision for safety.   Ambulation/Gait Ambulation/Gait assistance: Supervision Ambulation Distance (Feet): 500 Feet Assistive device: None Gait Pattern/deviations: Step-through pattern;Decreased  stride length;Staggering right Gait velocity: Decreased Gait velocity interpretation: Below normal speed for age/gender General Gait Details: Slightly antalgic due to weakness on the L side. Occasional stagger to the R noted but no assist required to recover.    Stairs         General stair comments: Pt declined practicing stairs again this session.   Wheelchair Mobility    Modified Rankin (Stroke Patients Only)       Balance Overall balance assessment: Needs assistance Sitting-balance support: Feet supported;No upper extremity supported Sitting balance-Leahy Scale: Fair     Standing balance support: No upper extremity supported;During functional activity Standing balance-Leahy Scale: Fair                      Cognition Arousal/Alertness: Awake/alert Behavior During Therapy: WFL for tasks assessed/performed Overall Cognitive Status: Within Functional Limits for tasks assessed                      Exercises      General Comments        Pertinent Vitals/Pain Pain Assessment: Faces Faces Pain Scale: Hurts a little bit Pain Location: Surgical site Pain Descriptors / Indicators: Operative site guarding;Discomfort Pain Intervention(s): Limited activity within patient's tolerance;Monitored during session;Repositioned    Home Living                      Prior Function            PT Goals (current goals can now be found in the care plan section) Acute Rehab PT Goals Patient Stated Goal: "I've been trying to do what you told me - it really does help" PT Goal Formulation: With patient Time For Goal Achievement: 07/05/15 Potential to Achieve Goals: Good Progress towards PT  goals: Progressing toward goals    Frequency  Min 5X/week    PT Plan Current plan remains appropriate    Co-evaluation             End of Session Equipment Utilized During Treatment: Back brace Activity Tolerance: Patient tolerated treatment well Patient  left: in chair;with call bell/phone within reach     Time: 0738-0749 PT Time Calculation (min) (ACUTE ONLY): 11 min  Charges:  $Gait Training: 8-22 mins                    G Codes:      Rolinda Roan Jun 30, 2015, 8:54 AM  Rolinda Roan, PT, DPT Acute Rehabilitation Services Pager: [phone]

## 2016-11-18 IMAGING — RF DG LUMBAR SPINE 2-3V
1 series · 3 of 3 positions shown · non-contrast
Comparison: None.

CLINICAL DATA: 64-year-old male with a history of posterior lumbar
interbody fusion

EXAM:
LUMBAR SPINE - 2-3 VIEW; DG C-ARM 61-120 MIN

[Series 1: run · 3 of 3 slices shown]
[im 1/3]
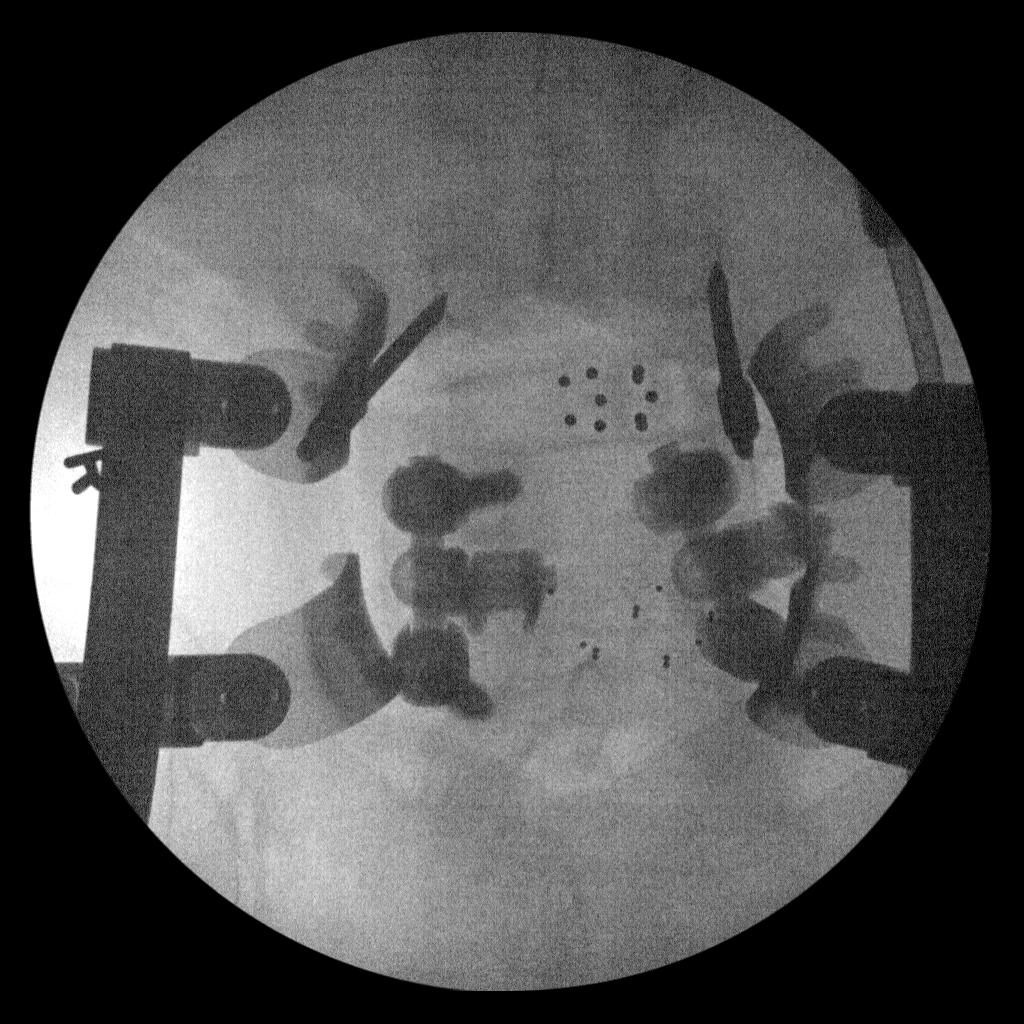
[im 2/3]
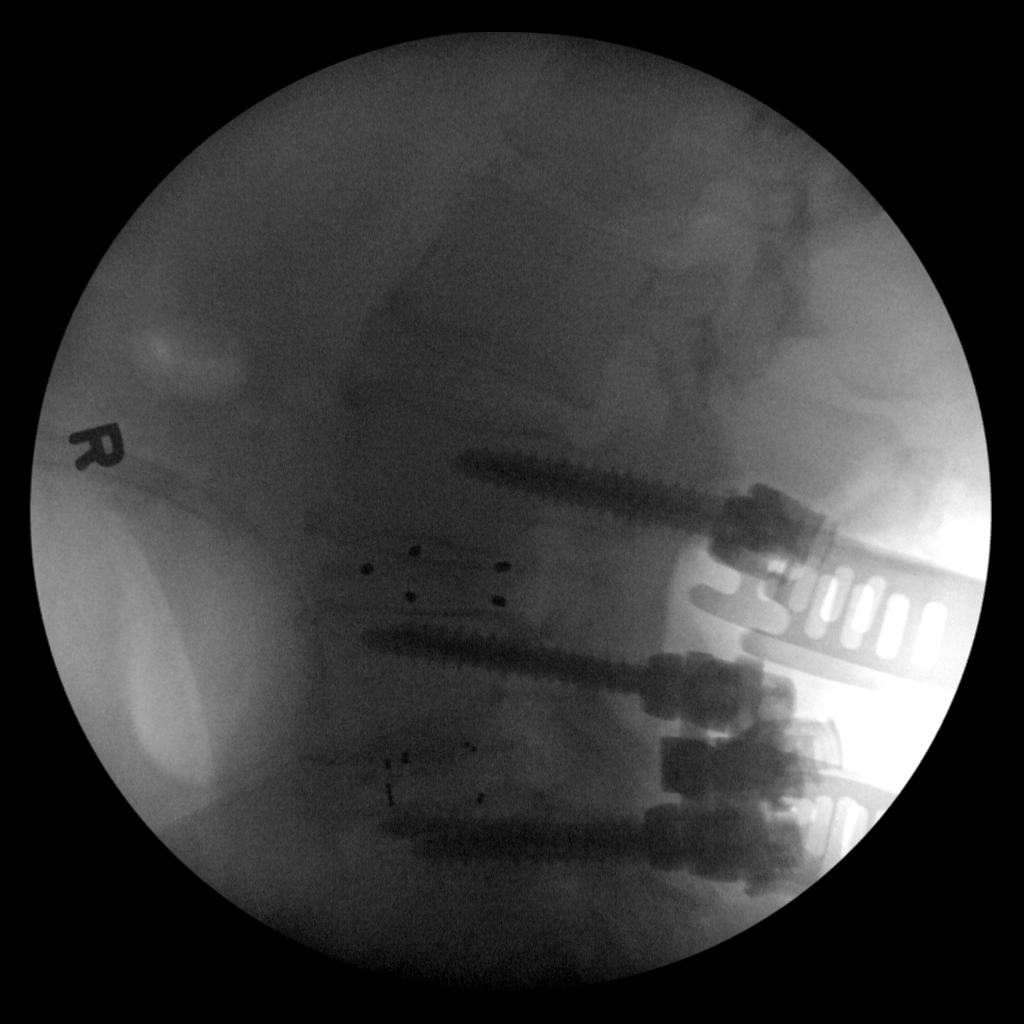
[im 3/3]
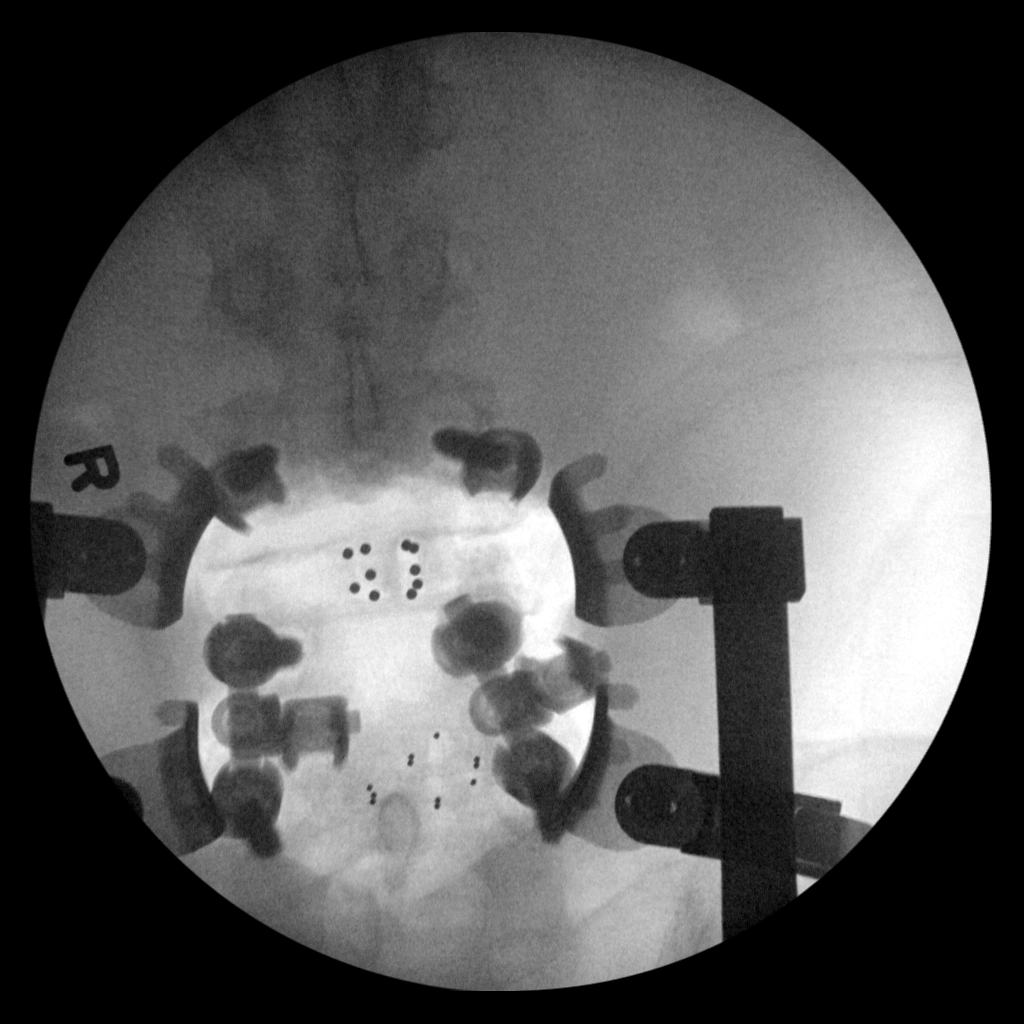

[3 of 3 positions shown; findings below may reference images not displayed]

FINDINGS: Intraoperative fluoroscopic spot images during posterior lumbar
interbody fusion.

Note that the sacrum is not visualized, and the numbering system has
been assigned using the electronic history of fusion spanning L3-L5.

Surgical changes of posterior lumbar interbody fusion with bilateral
pedicle screw fixation of the L3-L4 and L4-L5 levels.

Interbody spacers at L3-L4 and L4-L5 well position.

Lumbar rods not in position.
IMPRESSION: Interoperative fluoroscopic spot images during posterior lumbar
interbody fusion, with bilateral pedicle screws in place at the L3,
L4 and L5 levels, as well as interbody spacers well positioned at
L3-L4 and L4-L5.

Please refer to the dictated operative report for full details of
intraoperative findings and procedure.

## 2019-02-01 ENCOUNTER — Other Ambulatory Visit: Payer: Self-pay | Admitting: Specialist

## 2019-02-01 ENCOUNTER — Ambulatory Visit
Admission: RE | Admit: 2019-02-01 | Discharge: 2019-02-01 | Disposition: A | Discharge status: Home / Self Care | Payer: Medicare Other | Source: Ambulatory Visit | Attending: Specialist | Admitting: Specialist

## 2019-02-01 ENCOUNTER — Other Ambulatory Visit: Payer: Self-pay

## 2019-02-01 DIAGNOSIS — H20013 Primary iridocyclitis, bilateral: Secondary | ICD-10-CM

## 2019-10-11 ENCOUNTER — Encounter (HOSPITAL_COMMUNITY): Payer: Self-pay

## 2019-10-11 ENCOUNTER — Emergency Department (HOSPITAL_COMMUNITY)
Admission: EM | Admit: 2019-10-11 | Discharge: 2019-10-12 | Disposition: A | Discharge status: Home / Self Care | Payer: Medicare Other | Attending: Emergency Medicine | Admitting: Emergency Medicine

## 2019-10-11 ENCOUNTER — Other Ambulatory Visit: Payer: Self-pay

## 2019-10-11 DIAGNOSIS — Z79899 Other long term (current) drug therapy: Secondary | ICD-10-CM | POA: Diagnosis not present

## 2019-10-11 DIAGNOSIS — R350 Frequency of micturition: Secondary | ICD-10-CM | POA: Diagnosis not present

## 2019-10-11 DIAGNOSIS — R739 Hyperglycemia, unspecified: Secondary | ICD-10-CM | POA: Insufficient documentation

## 2019-10-11 DIAGNOSIS — R531 Weakness: Secondary | ICD-10-CM | POA: Diagnosis not present

## 2019-10-11 LAB — URINALYSIS, ROUTINE W REFLEX MICROSCOPIC
Bacteria, UA: NONE SEEN
Bilirubin Urine: NEGATIVE
Glucose, UA: >=500 mg/dL — AB
Hgb urine dipstick: NEGATIVE
Ketones, ur: NEGATIVE mg/dL
Leukocytes,Ua: NEGATIVE
Nitrite: NEGATIVE
Protein, ur: NEGATIVE mg/dL
Specific Gravity, Urine: 1.029 (ref 1.005–1.030)
pH: 5 (ref 5.0–8.0)

## 2019-10-11 LAB — CBC
HCT: 44.1 % (ref 39.0–52.0)
Hemoglobin: 15.9 g/dL (ref 13.0–17.0)
MCH: 32 pg (ref 26.0–34.0)
MCHC: 36.1 g/dL — ABNORMAL HIGH (ref 30.0–36.0)
MCV: 88.7 fL (ref 80.0–100.0)
Platelets: 210 10*3/uL (ref 150–400)
RBC: 4.97 MIL/uL (ref 4.22–5.81)
RDW: 11.7 % (ref 11.5–15.5)
WBC: 5 10*3/uL (ref 4.0–10.5)
nRBC: 0 % (ref 0.0–0.2)

## 2019-10-11 LAB — BASIC METABOLIC PANEL
Anion gap: 10 (ref 5–15)
BUN: 23 mg/dL (ref 8–23)
CO2: 25 mmol/L (ref 22–32)
Calcium: 9.6 mg/dL (ref 8.9–10.3)
Chloride: 97 mmol/L — ABNORMAL LOW (ref 98–111)
Creatinine, Ser: 0.97 mg/dL (ref 0.61–1.24)
GFR calc Af Amer: >60 mL/min (ref >60)
GFR calc non Af Amer: >60 mL/min (ref >60)
Glucose, Bld: 447 mg/dL — ABNORMAL HIGH (ref 70–99)
Potassium: 4.5 mmol/L (ref 3.5–5.1)
Sodium: 132 mmol/L — ABNORMAL LOW (ref 135–145)

## 2019-10-11 LAB — CBG MONITORING, ED
Glucose-Capillary: 297 mg/dL — ABNORMAL HIGH (ref 70–99)
Glucose-Capillary: 305 mg/dL — ABNORMAL HIGH (ref 70–99)
Glucose-Capillary: 461 mg/dL — ABNORMAL HIGH (ref 70–99)

## 2019-10-11 MED ORDER — SODIUM CHLORIDE 0.9 % IV BOLUS
1000.0000 mL | Freq: Once | INTRAVENOUS | Status: AC
  Administered 2019-10-11: 1000 mL via INTRAVENOUS

## 2019-10-11 NOTE — ED Provider Notes (Signed)
[facility] DEPT Provider Note   CSN: [id number] Arrival date & time: 10/11/19  1828     History Chief Complaint  Patient presents with  . Hyperglycemia    Erik Fletcher is a 69 y.o. male.  Patient with history of chronic back pain status post surgery, glaucoma presenting from PCPs office with hyperglycemia.  He underwent a routine physical today and mentioned that he was having frequent thirst, weight loss and frequent urination and polyuria and polydipsia.  He was concerned that he had diabetes with no history of this.  Labs were obtained and he was called to say his sugar was over 700.  Was told to come to the ED.  Patient states he been having frequency, urgency, polyuria and polydipsia for about 6 months.  He has not had a physical for a long time.  He denies any chronic medical conditions other than his back but she stay does not painful usually and is not taking pain medication for it.  He denies any chest pain or shortness of breath.  No dizziness or lightheadedness.  No blurry vision.  No nausea or vomiting.  No focal weakness, numbness or tingling.  No pain with urination or blood in the urine. No fever or recent illnesses.  He has never been diagnosed with diabetes in the past  The history is provided by the patient.  Hyperglycemia Associated symptoms: weakness   Associated symptoms: no abdominal pain, no chest pain, no dizziness, no dysuria, no fatigue, no fever, no nausea, no shortness of breath and no vomiting        Past Medical History:  Diagnosis Date  . Glaucoma   . Neuromuscular disorder (HCC)    numbness/tingling legs to feet  . PONV (postoperative nausea and vomiting)     Patient Active Problem List   Diagnosis Date Noted  . Lumbar disc disease with radiculopathy 06/27/2015    Past Surgical History:  Procedure Laterality Date  . BACK SURGERY     09/2009  . BACK SURGERY  2013  . CERVICAL SPINE SURGERY     2011  .  EYE SURGERY     09/2011 left eye  . SHOULDER SURGERY     right  3/83       Family History  Problem Relation Age of Onset  . Heart disease Mother   . Dementia Father   . Heart disease Father   . Prostate cancer Brother   . Other Daughter   . Other Daughter     Social History   Tobacco Use  . Smoking status: Never Smoker  . Smokeless tobacco: Never Used  Substance Use Topics  . Alcohol use: No  . Drug use: No    Home Medications Prior to Admission medications   Medication Sig Start Date End Date Taking? Authorizing Provider  HYDROcodone-acetaminophen (NORCO/VICODIN) 5-325 MG tablet Take 1-2 tablets by mouth every 4 (four) hours as needed (mild pain). 06/28/15   Erline Levine, MD  latanoprost (XALATAN) 0.005 % ophthalmic solution Place 1 drop into the right eye at bedtime.     [provider]  methocarbamol (ROBAXIN) 500 MG tablet Take 1 tablet (500 mg total) by mouth every 6 (six) hours as needed for muscle spasms. 06/28/15   Erline Levine, MD  oxyCODONE-acetaminophen (PERCOCET/ROXICET) 5-325 MG per tablet Take 1-2 tablets by mouth every 4 (four) hours as needed. Patient not taking: Reported on 06/14/2015 10/24/12   Karie Chimera, MD  RESTASIS 0.05 % ophthalmic emulsion  Place 1 drop into both eyes 2 (two) times daily. 04/17/15   [provider]    Allergies    Patient has no known allergies.  Review of Systems   Review of Systems  Constitutional: Positive for unexpected weight change. Negative for activity change, appetite change, fatigue and fever.  HENT: Negative for congestion and rhinorrhea.   Respiratory: Negative for cough, choking, chest tightness and shortness of breath.   Cardiovascular: Negative for chest pain.  Gastrointestinal: Negative for abdominal pain, nausea and vomiting.  Genitourinary: Positive for frequency and urgency. Negative for dysuria.  Musculoskeletal: Negative for arthralgias and myalgias.  Skin: Negative for rash.    Neurological: Positive for weakness. Negative for dizziness and headaches.   all other systems are negative except as noted in the HPI and PMH.   Physical Exam Updated Vital Signs BP 135/89 (BP Location: Right Arm)   Pulse 79   Temp 99 F (37.2 C) (Oral)   Resp (!) 22   Ht 5\' 7"  (1.702 m)   Wt 83.5 kg   SpO2 100%   BMI 28.82 kg/m   Physical Exam Vitals and nursing note reviewed.  Constitutional:      General: He is not in acute distress.    Appearance: He is well-developed.     Comments: Well-appearing, no distress  HENT:     Head: Normocephalic and atraumatic.     Mouth/Throat:     Mouth: Mucous membranes are moist.     Pharynx: No oropharyngeal exudate.  Eyes:     Conjunctiva/sclera: Conjunctivae normal.     Pupils: Pupils are equal, round, and reactive to light.  Neck:     Comments: No meningismus. Cardiovascular:     Rate and Rhythm: Normal rate and regular rhythm.     Heart sounds: Normal heart sounds. No murmur.  Pulmonary:     Effort: Pulmonary effort is normal. No respiratory distress.     Breath sounds: Normal breath sounds.  Abdominal:     Palpations: Abdomen is soft.     Tenderness: There is no abdominal tenderness. There is no guarding or rebound.  Musculoskeletal:        General: No tenderness. Normal range of motion.     Cervical back: Normal range of motion and neck supple.  Skin:    General: Skin is warm.     Capillary Refill: Capillary refill takes less than 2 seconds.  Neurological:     General: No focal deficit present.     Mental Status: He is alert and oriented to person, place, and time. Mental status is at baseline.     Cranial Nerves: No cranial nerve deficit.     Motor: No abnormal muscle tone.     Coordination: Coordination normal.     Comments:  5/5 strength throughout. CN 2-12 intact.Equal grip strength.   Psychiatric:        Behavior: Behavior normal.     ED Results / Procedures / Treatments   Labs (all labs ordered are  listed, but only abnormal results are displayed) Labs Reviewed  BASIC METABOLIC PANEL - Abnormal; Notable for the following components:      Result Value   Sodium 132 (*)    Chloride 97 (*)    Glucose, Bld 447 (*)    All other components within normal limits  CBC - Abnormal; Notable for the following components:   MCHC 36.1 (*)    All other components within normal limits  URINALYSIS, ROUTINE W REFLEX MICROSCOPIC -  Abnormal; Notable for the following components:   Color, Urine STRAW (*)    Glucose, UA >=500 (*)    All other components within normal limits  CBG MONITORING, ED - Abnormal; Notable for the following components:   Glucose-Capillary 461 (*)    All other components within normal limits  CBG MONITORING, ED - Abnormal; Notable for the following components:   Glucose-Capillary 305 (*)    All other components within normal limits  CBG MONITORING, ED - Abnormal; Notable for the following components:   Glucose-Capillary 297 (*)    All other components within normal limits    EKG EKG Interpretation  Date/Time:  Monday October 11 2019 23:13:55 EDT Ventricular Rate:  79 PR Interval:    QRS Duration: 122 QT Interval:  398 QTC Calculation: 457 R Axis:   -43 Text Interpretation: Sinus rhythm Left bundle branch block No significant change was found Confirmed by Ezequiel Essex [phone]) on 10/11/2019 11:18:19 PM   Radiology No results found.  Procedures Procedures (including critical care time)  Medications Ordered in ED Medications  sodium chloride 0.9 % bolus 1,000 mL (1,000 mLs Intravenous New Bag/Given 10/11/19 2316)    ED Course  I have reviewed the triage vital signs and the nursing notes.  Pertinent labs & imaging results that were available during my care of the patient were reviewed by me and considered in my medical decision making (see chart for details).    MDM Rules/Calculators/A&P                     Patient sent from PCP with hyperglycemia in setting  of frequency, polyuria and polydipsia.  He has no fever and stable vital signs.  Blood sugar on arrival was 461 with normal anion gap.  No ketones in urine. Sugar has improved to 305 while he has been waiting. He will be given IV hydration.  Discussed with patient he likely does have diabetes and will need to be was started on Metformin and will need to get a glucometer. He does not appear to be in DKA.  Blood sugar improved to 297.  Discussed with patient that he likely does have diabetes.  Recommend starting Metformin and obtaining glucometer to check his sugar twice daily.  Follow-up with his primary doctor.  Return to the ED with chest pain, shortness of breath, or other concerns Final Clinical Impression(s) / ED Diagnoses Final diagnoses:  Hyperglycemia    Rx / DC Orders ED Discharge Orders    None       Kaylani Fromme, Annie Main, MD 10/12/19 [phone]

## 2019-10-11 NOTE — ED Triage Notes (Signed)
Went to PCP for a physical with suspicion of new onset diabetes. Frequent thirst, weight loss, frequent urination. No vision changes. PCP said they would call with abnormal results. They called with a glucose >700.

## 2019-10-12 ENCOUNTER — Encounter: Payer: Self-pay | Admitting: Gastroenterology

## 2019-10-12 MED ORDER — METFORMIN HCL 500 MG PO TABS: 500.0000 mg | ORAL_TABLET | Freq: Two times a day (BID) | ORAL | 0 refills | Status: DC

## 2019-10-12 MED ORDER — FREESTYLE SYSTEM KIT: 1.0000 | PACK | 0 refills | Status: DC | PRN

## 2019-10-12 NOTE — Discharge Instructions (Signed)
It appears that you are diabetic.  Take the Metformin twice daily and check your blood sugar twice daily in the morning and at night.  Keep a record of your blood sugars for your doctor and follow-up for further adjustments of your diabetic medication.  Return to the ED with new or worsening symptoms.

## 2019-10-12 NOTE — ED Notes (Signed)
Pt verbalized dc instructions and follow up care. Alert and ambulatory. No iv. 

## 2019-11-11 ENCOUNTER — Ambulatory Visit (AMBULATORY_SURGERY_CENTER): Payer: Self-pay

## 2019-11-11 ENCOUNTER — Other Ambulatory Visit: Payer: Self-pay

## 2019-11-11 VITALS — Temp 96.8°F | Ht 69.0 in | Wt 184.4 lb

## 2019-11-11 DIAGNOSIS — Z1211 Encounter for screening for malignant neoplasm of colon: Secondary | ICD-10-CM

## 2019-11-11 NOTE — Progress Notes (Signed)
No allergies to soy or egg Pt is not on blood thinners or diet pills Denies issues with sedation/intubation Denies atrial flutter/fib Denies constipation   Emmi instructions given to pt  Pt is aware of Covid safety and care partner requirements.  

## 2019-11-16 ENCOUNTER — Encounter: Payer: Medicare Other | Attending: Family Medicine | Admitting: Dietician

## 2019-11-16 ENCOUNTER — Other Ambulatory Visit: Payer: Self-pay

## 2019-11-16 ENCOUNTER — Encounter: Payer: Self-pay | Admitting: Dietician

## 2019-11-16 DIAGNOSIS — E119 Type 2 diabetes mellitus without complications: Secondary | ICD-10-CM | POA: Diagnosis present

## 2019-11-16 NOTE — Progress Notes (Signed)
Patient was seen on 11/16/19 for the first of a series of three diabetes self-management courses at the [facility].  Patient Education Plan per assessed needs and concerns is to attend three course education program for Diabetes Self Management Education.  The following learning objectives were met by the patient during this class:  Describe diabetes, types of diabetes and pathophysiology  State some common risk factors for diabetes  Defines the role of glucose and insulin  Describe the relationship between diabetes and cardiovascular and other risks  State the members of the Healthcare Team  States the rationale for glucose monitoring and when to test  State their individual Target Range  State the importance of logging glucose readings and how to interpret the readings  Identifies A1C target  Explain the correlation between A1c and eAG values  State symptoms and treatment of high blood glucose and low blood glucose  Explain proper technique for glucose testing and identify proper sharps disposal  Handouts given during class include:  How to Thrive:  A Guide for Your Journey with Diabetes by the ADA  Meal Plan Card and carbohydrate content list  Dietary intake form  Low Sodium Flavoring Tips  Types of Fats  Dining Out  Label reading  Snack list  Planning a balanced meal  The diabetes portion plate  Diabetes Resources  A1c to eAG Conversion Chart  Blood Glucose Log  Diabetes Recommended Care Schedule  Support Group  Diabetes Success Plan  Core Class Satisfaction Survey   Follow-Up Plan:  Attend core 2   

## 2019-11-19 ENCOUNTER — Ambulatory Visit (INDEPENDENT_AMBULATORY_CARE_PROVIDER_SITE_OTHER): Payer: Medicare Other

## 2019-11-19 ENCOUNTER — Other Ambulatory Visit: Payer: Self-pay | Admitting: Gastroenterology

## 2019-11-19 DIAGNOSIS — Z1159 Encounter for screening for other viral diseases: Secondary | ICD-10-CM

## 2019-11-19 LAB — SARS CORONAVIRUS 2 (TAT 6-24 HRS): SARS Coronavirus 2: NEGATIVE

## 2019-11-23 ENCOUNTER — Encounter: Payer: Self-pay | Admitting: Dietician

## 2019-11-23 ENCOUNTER — Other Ambulatory Visit: Payer: Self-pay

## 2019-11-23 ENCOUNTER — Encounter: Payer: Medicare Other | Attending: Family Medicine | Admitting: Dietician

## 2019-11-23 DIAGNOSIS — E119 Type 2 diabetes mellitus without complications: Secondary | ICD-10-CM | POA: Diagnosis present

## 2019-11-23 NOTE — Progress Notes (Signed)
Patient was seen on 11/23/2019 for the second of a series of three diabetes self-management courses at the [facility]. The following learning objectives were met by the patient during this class: ° °· Describe the role of different macronutrients on glucose °· Explain how carbohydrates affect blood glucose °· State what foods contain the most carbohydrates °· Demonstrate carbohydrate counting °· Demonstrate how to read Nutrition Facts food label °· Describe effects of various fats on heart health °· Describe the importance of good nutrition for health and healthy eating strategies °· Describe techniques for managing your shopping, cooking and meal planning °· List strategies to follow meal plan when dining out °· Describe the effects of alcohol on glucose and how to use it safely ° °Goals:  °Follow Diabetes Meal Plan as instructed  °Aim to spread carbs evenly throughout the day  °Aim for 3 meals per day and snacks as needed °Include lean protein foods to meals/snacks  °Monitor glucose levels as instructed by your doctor  ° °Follow-Up Plan: °· Attend Core 3 °· Work towards following your personal food plan.  ° °

## 2019-11-24 ENCOUNTER — Ambulatory Visit (AMBULATORY_SURGERY_CENTER): Payer: Medicare Other | Admitting: Gastroenterology

## 2019-11-24 ENCOUNTER — Other Ambulatory Visit: Payer: Self-pay

## 2019-11-24 ENCOUNTER — Encounter: Payer: Self-pay | Admitting: Gastroenterology

## 2019-11-24 VITALS — BP 111/74 | HR 69 | Temp 97.1°F | Resp 11 | Ht 69.0 in | Wt 184.0 lb

## 2019-11-24 DIAGNOSIS — Z1211 Encounter for screening for malignant neoplasm of colon: Secondary | ICD-10-CM | POA: Diagnosis not present

## 2019-11-24 DIAGNOSIS — D120 Benign neoplasm of cecum: Secondary | ICD-10-CM

## 2019-11-24 DIAGNOSIS — K635 Polyp of colon: Secondary | ICD-10-CM

## 2019-11-24 DIAGNOSIS — D126 Benign neoplasm of colon, unspecified: Secondary | ICD-10-CM | POA: Diagnosis not present

## 2019-11-24 MED ORDER — SODIUM CHLORIDE 0.9 % IV SOLN: 500.0000 mL | Freq: Once | INTRAVENOUS | Status: DC

## 2019-11-24 NOTE — Op Note (Signed)
[facility] Patient Name: Erik Fletcher Procedure Date: 11/24/2019 9:22 AM MRN: [id number] Endoscopist: Mallie Mussel L. Loletha Carrow , MD Age: 69 Referring MD:  Date of Birth: 20-Sep-1950 Gender: Male Account #: [id number] Procedure:                Colonoscopy Indications:              Screening for colorectal malignant neoplasm, This                            is the patient's first colonoscopy Medicines:                Monitored Anesthesia Care Procedure:                Pre-Anesthesia Assessment:                           - Prior to the procedure, a History and Physical                            was performed, and patient medications and                            allergies were reviewed. The patient's tolerance of                            previous anesthesia was also reviewed. The risks                            and benefits of the procedure and the sedation                            options and risks were discussed with the patient.                            All questions were answered, and informed consent                            was obtained. Prior Anticoagulants: The patient has                            taken no previous anticoagulant or antiplatelet                            agents. ASA Grade Assessment: II - A patient with                            mild systemic disease. After reviewing the risks                            and benefits, the patient was deemed in                            satisfactory condition to undergo the procedure.  After obtaining informed consent, the colonoscope                            was passed under direct vision. Throughout the                            procedure, the patient's blood pressure, pulse, and                            oxygen saturations were monitored continuously. The                            Colonoscope was introduced through the anus and                            advanced to the the  cecum, identified by                            appendiceal orifice and ileocecal valve. The                            colonoscopy was performed without difficulty. The                            patient tolerated the procedure well. The quality                            of the bowel preparation was good. The ileocecal                            valve, appendiceal orifice, and rectum were                            photographed. The bowel preparation used was                            Miralax. Scope In: 9:30:02 AM Scope Out: 9:49:15 AM Scope Withdrawal Time: 0 hours 13 minutes 17 seconds  Total Procedure Duration: 0 hours 19 minutes 13 seconds  Findings:                 The perianal and digital rectal examinations were                            normal.                           A diminutive polyp was found in the ileocecal                            valve. The polyp was semi-sessile. The polyp was                            removed with a cold biopsy forceps. Resection and  retrieval were complete.                           A few small-mouthed diverticula were found in the                            left colon.                           Internal hemorrhoids were found.                           The exam was otherwise without abnormality on                            direct and retroflexion views. Complications:            No immediate complications. Estimated Blood Loss:     Estimated blood loss was minimal. Impression:               - One diminutive polyp at the ileocecal valve,                            removed with a cold biopsy forceps. Resected and                            retrieved.                           - Diverticulosis in the left colon.                           - Internal hemorrhoids.                           - The examination was otherwise normal on direct                            and retroflexion views. Recommendation:           -  Patient has a contact number available for                            emergencies. The signs and symptoms of potential                            delayed complications were discussed with the                            patient. Return to normal activities tomorrow.                            Written discharge instructions were provided to the                            patient.                           -  Resume previous diet.                           - Continue present medications.                           - Await pathology results.                           - Based on current guidelines, no repeat                            surveillance colonoscopy recommended. Annett Boxwell L. Loletha Carrow, MD 11/24/2019 9:53:14 AM This report has been signed electronically.

## 2019-11-24 NOTE — Progress Notes (Signed)
Report given to PACU, vss 

## 2019-11-24 NOTE — Progress Notes (Signed)
Pt's states no medical or surgical changes since previsit or office visit. 

## 2019-11-24 NOTE — Patient Instructions (Signed)
YOU HAD AN ENDOSCOPIC PROCEDURE TODAY AT THE [facility]:   Refer to the procedure report that was given to you for any specific questions about what was found during the examination.  If the procedure report does not answer your questions, please call your gastroenterologist to clarify.  If you requested that your care partner not be given the details of your procedure findings, then the procedure report has been included in a sealed envelope for you to review at your convenience later.  YOU SHOULD EXPECT: Some feelings of bloating in the abdomen. Passage of more gas than usual.  Walking can help get rid of the air that was put into your GI tract during the procedure and reduce the bloating. If you had a lower endoscopy (such as a colonoscopy or flexible sigmoidoscopy) you may notice spotting of blood in your stool or on the toilet paper. If you underwent a bowel prep for your procedure, you may not have a normal bowel movement for a few days.  Please Note:  You might notice some irritation and congestion in your nose or some drainage.  This is from the oxygen used during your procedure.  There is no need for concern and it should clear up in a day or so.  SYMPTOMS TO REPORT IMMEDIATELY:   Following lower endoscopy (colonoscopy or flexible sigmoidoscopy):  Excessive amounts of blood in the stool  Significant tenderness or worsening of abdominal pains  Swelling of the abdomen that is new, acute  Fever of 100F or higher  For urgent or emergent issues, a gastroenterologist can be reached at any hour by calling [phone]. Do not use MyChart messaging for urgent concerns.    DIET:  We do recommend a small meal at first, but then you may proceed to your regular diet.  Drink plenty of fluids but you should avoid alcoholic beverages for 24 hours.  ACTIVITY:  You should plan to take it easy for the rest of today and you should NOT DRIVE or use heavy machinery until tomorrow (because  of the sedation medicines used during the test).    FOLLOW UP: Our staff will call the number listed on your records 48-72 hours following your procedure to check on you and address any questions or concerns that you may have regarding the information given to you following your procedure. If we do not reach you, we will leave a message.  We will attempt to reach you two times.  During this call, we will ask if you have developed any symptoms of COVID 19. If you develop any symptoms (ie: fever, flu-like symptoms, shortness of breath, cough etc.) before then, please call [phone].  If you test positive for Covid 19 in the 2 weeks post procedure, please call and report this information to us.    If any biopsies were taken you will be contacted by phone or by letter within the next 1-3 weeks.  Please call us at [phone] if you have not heard about the biopsies in 3 weeks.    SIGNATURES/CONFIDENTIALITY: You and/or your care partner have signed paperwork which will be entered into your electronic medical record.  These signatures attest to the fact that that the information above on your After Visit Summary has been reviewed and is understood.  Full responsibility of the confidentiality of this discharge information lies with you and/or your care-partner. 

## 2019-11-24 NOTE — Progress Notes (Signed)
Called to room to assist during endoscopic procedure.  Patient ID and intended procedure confirmed with present staff. Received instructions for my participation in the procedure from the performing physician.  

## 2019-11-26 ENCOUNTER — Telehealth: Payer: Self-pay

## 2019-11-26 ENCOUNTER — Encounter: Payer: Self-pay | Admitting: Gastroenterology

## 2019-11-26 NOTE — Telephone Encounter (Signed)
2nd follow up call made.  NALM 

## 2019-11-26 NOTE — Telephone Encounter (Signed)
Attempted to reach patient for post-procedure f/u call. No answer. Left message that we will make another attempt to reach him later today and for him to please not hesitate to call us if he has any questions/concerns regarding his care. 

## 2019-11-30 ENCOUNTER — Encounter: Payer: Medicare Other | Admitting: Dietician

## 2019-11-30 ENCOUNTER — Other Ambulatory Visit: Payer: Self-pay

## 2019-11-30 DIAGNOSIS — E119 Type 2 diabetes mellitus without complications: Secondary | ICD-10-CM | POA: Diagnosis not present

## 2019-12-09 ENCOUNTER — Encounter: Payer: Self-pay | Admitting: Dietician

## 2019-12-09 NOTE — Progress Notes (Signed)
Patient was seen on 11/30/2019 for the third of a series of three diabetes self-management courses at the [facility].   Erik Fletcher the amount of activity recommended for healthy living . Describe activities suitable for individual needs . Identify ways to regularly incorporate activity into daily life . Identify barriers to activity and ways to over come these barriers  Identify diabetes medications being personally used and their primary action for lowering glucose and possible side effects . Describe role of stress on blood glucose and develop strategies to address psychosocial issues . Identify diabetes complications and ways to prevent them  Explain how to manage diabetes during illness . Evaluate success in meeting personal goal . Establish 2-3 goals that they will plan to diligently work on  Goals:   I will count my carb choices at most meals and snacks  I will be active 30 minutes or more 5 times a week  I will take my diabetes medications as scheduled  I will eat less unhealthy fats by eating less meat  I will test my glucose at least 2 times a day, 7 days a week  I will look at patterns in my record book at least 5 days a month  To help manage stress I will  exercise at least 5 times a week  Your patient has identified these potential barriers to change:  Motivation  Your patient has identified their diabetes self-care support plan as  On-line Resources  American Diabetes Association Website    Plan:  Attend Support Group as desired

## 2020-10-31 ENCOUNTER — Other Ambulatory Visit: Payer: Self-pay | Admitting: Family Medicine

## 2020-10-31 DIAGNOSIS — R1011 Right upper quadrant pain: Secondary | ICD-10-CM

## 2020-11-24 ENCOUNTER — Other Ambulatory Visit: Payer: Self-pay | Admitting: Family Medicine

## 2020-11-24 ENCOUNTER — Ambulatory Visit
Admission: RE | Admit: 2020-11-24 | Discharge: 2020-11-24 | Disposition: A | Discharge status: Home / Self Care | Payer: Medicare Other | Source: Ambulatory Visit | Attending: Family Medicine | Admitting: Family Medicine

## 2020-11-24 DIAGNOSIS — R1011 Right upper quadrant pain: Secondary | ICD-10-CM

## 2020-11-24 DIAGNOSIS — R935 Abnormal findings on diagnostic imaging of other abdominal regions, including retroperitoneum: Secondary | ICD-10-CM

## 2020-11-29 ENCOUNTER — Other Ambulatory Visit: Payer: Self-pay | Admitting: Family Medicine

## 2020-11-29 ENCOUNTER — Ambulatory Visit
Admission: RE | Admit: 2020-11-29 | Discharge: 2020-11-29 | Disposition: A | Discharge status: Home / Self Care | Payer: Medicare Other | Source: Ambulatory Visit | Attending: Family Medicine | Admitting: Family Medicine

## 2020-11-29 DIAGNOSIS — R935 Abnormal findings on diagnostic imaging of other abdominal regions, including retroperitoneum: Secondary | ICD-10-CM

## 2020-11-29 MED ORDER — GADOBENATE DIMEGLUMINE 529 MG/ML IV SOLN
17.0000 mL | Freq: Once | INTRAVENOUS | Status: AC | PRN
  Administered 2020-11-29: 17 mL via INTRAVENOUS

## 2020-11-30 ENCOUNTER — Telehealth: Payer: Self-pay

## 2020-11-30 NOTE — Telephone Encounter (Signed)
Received an urgent referral from Dr. Melinda Crutch for evaluation of Abnormal MRI of the abdomen. 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 Please advise, thanks.

## 2020-12-01 ENCOUNTER — Other Ambulatory Visit (INDEPENDENT_AMBULATORY_CARE_PROVIDER_SITE_OTHER): Payer: Medicare Other

## 2020-12-01 ENCOUNTER — Ambulatory Visit: Payer: Medicare Other | Admitting: Gastroenterology

## 2020-12-01 ENCOUNTER — Encounter: Payer: Self-pay | Admitting: Gastroenterology

## 2020-12-01 VITALS — BP 130/80 | HR 51 | Ht 69.0 in | Wt 178.0 lb

## 2020-12-01 DIAGNOSIS — R634 Abnormal weight loss: Secondary | ICD-10-CM

## 2020-12-01 DIAGNOSIS — R160 Hepatomegaly, not elsewhere classified: Secondary | ICD-10-CM

## 2020-12-01 LAB — CBC WITH DIFFERENTIAL/PLATELET
Basophils Absolute: 0.1 10*3/uL (ref 0.0–0.1)
Basophils Relative: 1 % (ref 0.0–3.0)
Eosinophils Absolute: 0.3 10*3/uL (ref 0.0–0.7)
Eosinophils Relative: 3.5 % (ref 0.0–5.0)
HCT: 40.1 % (ref 39.0–52.0)
Hemoglobin: 13.9 g/dL (ref 13.0–17.0)
Lymphocytes Relative: 16.3 % (ref 12.0–46.0)
Lymphs Abs: 1.3 10*3/uL (ref 0.7–4.0)
MCHC: 34.8 g/dL (ref 30.0–36.0)
MCV: 90.7 fl (ref 78.0–100.0)
Monocytes Absolute: 0.8 10*3/uL (ref 0.1–1.0)
Monocytes Relative: 9.3 % (ref 3.0–12.0)
Neutro Abs: 5.7 10*3/uL (ref 1.4–7.7)
Neutrophils Relative %: 69.9 % (ref 43.0–77.0)
Platelets: 278 10*3/uL (ref 150.0–400.0)
RBC: 4.42 Mil/uL (ref 4.22–5.81)
RDW: 12.8 % (ref 11.5–15.5)
WBC: 8.1 10*3/uL (ref 4.0–10.5)

## 2020-12-01 NOTE — Patient Instructions (Signed)
If you are age 72 or older, your body mass index should be between 23-30. Your Body mass index is 26.29 kg/m. If this is out of the aforementioned range listed, please consider follow up with your Primary Care Provider.  If you are age 52 or younger, your body mass index should be between 19-25. Your Body mass index is 26.29 kg/m. If this is out of the aformentioned range listed, please consider follow up with your Primary Care Provider.   __________________________________________________________  The [facility] providers would like to encourage you to use [facility] to communicate with providers for non-urgent requests or questions.  Due to long hold times on the telephone, sending your provider a message by [facility] may be a faster and more efficient way to get a response.  Please allow 48 business hours for a response.  Please remember that this is for non-urgent requests.   Your provider has requested that you go to the basement level for lab work before leaving today. Press "B" on the elevator. The lab is located at the first door on the left as you exit the elevator.  Due to recent changes in healthcare laws, you may see the results of your imaging and laboratory studies on MyChart before your provider has had a chance to review them.  We understand that in some cases there may be results that are confusing or concerning to you. Not all laboratory results come back in the same time frame and the provider may be waiting for multiple results in order to interpret others.  Please give [place] 48 hours in order for your provider to thoroughly review all the results before contacting the office for clarification of your results.   We will call you with the information on the Liver biopsy early next week.   It was a pleasure to see you today!  Thank you for trusting me with your gastrointestinal care!

## 2020-12-01 NOTE — Telephone Encounter (Signed)
Pt has been scheduled as requested by Dr Loletha Carrow. Recent referral and labs have been faxed.  No biopsy results to be sent

## 2020-12-01 NOTE — Telephone Encounter (Signed)
This is urgent.  Please have him come for an office visit at 4pm today.  I need any recent labs from the referring physician, especially CBC, LFTs, BMP/CMP  - HD

## 2020-12-01 NOTE — Progress Notes (Signed)
[facility] Consult Note:  History: Huel Centola. 12/01/2020  Referring provider: Lawerance Cruel, MD  Reason for consult/chief complaint: No chief complaint on file.   Subjective  HPI:  This is a very pleasant 70 year old man known to me from a screening colonoscopy in June 2021, where no adenomatous polyps were found.  He was sent back urgently by primary care for evaluation of liver masses.  Child says about 2 months ago he was having some right-sided back pain coming around to the front, it was on and off and nagging him but he did not think much of it.  He is also slowly lost about 40 pounds over the last year, but he attributed to being busy taking care of his wife (also my patient, who has rectal cancer), diabetes and other things. When he saw Dr. Harrington Challenger recently they decided to do an ultrasound, it showed liver masses, leading to an MRI noted below. His appetite is fair, we cannot say quite why.  Sometimes he does not feel like eating much, but he does not get nauseated or vomit.  He denies dysphagia or odynophagia.  Bowel habits are regular without rectal bleeding.  ROS:  Review of Systems  Constitutional:  Positive for unexpected weight change. Negative for appetite change.  HENT:  Negative for mouth sores and voice change.   Eyes:  Negative for pain and redness.  Respiratory:  Negative for cough and shortness of breath.   Cardiovascular:  Negative for chest pain and palpitations.  Genitourinary:  Negative for dysuria and hematuria.  Musculoskeletal:  Negative for arthralgias and myalgias.  Skin:  Negative for pallor and rash.  Neurological:  Negative for weakness and headaches.  Hematological:  Negative for adenopathy.    Past Medical History: Past Medical History:  Diagnosis Date   Allergy    seasonal allergies   Diabetes mellitus without complication (HCC)    Glaucoma    Neuromuscular disorder (HCC)    numbness/tingling legs to  feet   PONV (postoperative nausea and vomiting)      Past Surgical History: Past Surgical History:  Procedure Laterality Date   BACK SURGERY     09/2009   BACK SURGERY  2013   CERVICAL SPINE SURGERY     2011   EYE SURGERY     09/2011 left eye   SHOULDER SURGERY     right  3/83     Family History: Family History  Problem Relation Age of Onset   Heart disease Mother    Dementia Father    Heart disease Father    Prostate cancer Brother    Other Daughter    Other Daughter    Colon cancer Neg Hx    Colon polyps Neg Hx    Esophageal cancer Neg Hx    Rectal cancer Neg Hx    Stomach cancer Neg Hx     Social History: Social History   Socioeconomic History   Marital status: Married    Spouse name: Not on file   Number of children: 2   Years of education: Not on file   Highest education level: Not on file  Occupational History   Occupation: retired  Tobacco Use   Smoking status: Never   Smokeless tobacco: Never  Vaping Use   Vaping Use: Never used  Substance and Sexual Activity   Alcohol use: No   Drug use: No   Sexual activity: Yes  Other Topics Concern   Not on file  Social History Narrative   Not on file   Social Determinants of Health   Financial Resource Strain: Not on file  Food Insecurity: Not on file  Transportation Needs: Not on file  Physical Activity: Not on file  Stress: Not on file  Social Connections: Not on file    Allergies: No Known Allergies  Outpatient Meds: Current Outpatient Medications  Medication Sig Dispense Refill   COMBIGAN 0.2-0.5 % ophthalmic solution Place 1 drop into the right eye 2 (two) times daily.     EUTHYROX 50 MCG tablet Take 50 mcg by mouth every morning.     glucose monitoring kit (FREESTYLE) monitoring kit 1 each by Does not apply route as needed for other. 1 each 0   latanoprost (XALATAN) 0.005 % ophthalmic solution Place 1 drop into the right eye at bedtime.      metFORMIN (GLUCOPHAGE) 500 MG tablet Take 1  tablet (500 mg total) by mouth 2 (two) times daily with a meal. 60 tablet 0   Multiple Vitamin (MULTIVITAMIN PO) Take by mouth.     RESTASIS 0.05 % ophthalmic emulsion Place 1 drop into both eyes 2 (two) times daily.     No current facility-administered medications for this visit.      ___________________________________________________________________ Objective   Exam:  BP 130/80   Pulse (!) 51   Ht 5' 9"  (1.753 m)   Wt 178 lb (80.7 kg)   BMI 26.29 kg/m  Wt Readings from Last 3 Encounters:  12/01/20 178 lb (80.7 kg)  11/24/19 184 lb (83.5 kg)  11/16/19 185 lb (83.9 kg)    General: Not acutely ill-appearing Eyes: sclera anicteric, no redness ENT: oral mucosa moist without lesions, no cervical or supraclavicular lymphadenopathy CV: RRR without murmur, S1/S2, no JVD, no peripheral edema Resp: clear to auscultation bilaterally, normal RR and effort noted GI: soft, tender liver edge felt about 3 fingerbreadths below costal margin with inspiration tenderness, with active bowel sounds. Skin; warm and dry, no rash or jaundice noted Neuro: awake, alert and oriented x 3. Normal gross motor function and fluent speech  Labs:  10/25/2020 PCP labs  Hemoglobin A1c 8.0  Glucose 201, BUN 32 creatinine creatinine 1.1  Calcium 10.6  AST, ALT, alkaline phosphatase, total bilirubin all normal  TSH 8.9  Radiologic Studies:  Narrative & Impression CLINICAL DATA:  Right upper quadrant abdominal pain   EXAM: ABDOMEN ULTRASOUND COMPLETE   COMPARISON:  None.   FINDINGS: Gallbladder: No gallstones or wall thickening visualized. No sonographic Murphy sign noted by sonographer.   Common bile duct: Diameter: 3 mm   Liver: Coarsened inhomogeneous hepatic echotexture. Bilobar hepatic masses the largest is in infiltrative appearing heterogeneously hypoechoic lesion in the right lobe of the liver measuring approximately 10.7 x 10.3 x 4.5 cm and in the left lobe of the liver there is  a 3.3 cm rounded heterogeneous lesion. Complex cysts in the left lobe of the liver measuring up to 2.7 cm. 1.1 cm shadowing calcification in the right lobe of the liver. Portal vein is patent on color Doppler imaging with normal direction of blood flow towards the liver.   IVC: No abnormality visualized.   Pancreas: Solid heterogeneous mass superior to the pancreatic head measuring 3.7 x 3.6 x 3.3 cm.   Spleen: Size and appearance within normal limits.   Right Kidney: Length: 10.9 cm. Echogenicity within normal limits. No mass or hydronephrosis visualized.   Left Kidney: Length: 10.6 cm. Echogenicity within normal limits. 2.1 cm complex left renal  cyst. Additional 1 cm exophytic cyst. No mass or hydronephrosis visualized.   Abdominal aorta: No aneurysm visualized.   Other findings: None.   IMPRESSION: 1. Solid bilobar hepatic lesions including a large infiltrative lesion in the right lobe of the liver as well as a heterogeneous mass just superior to the pancreatic head, findings which are nonspecific but concerning for a neoplastic process. 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 However, other neoplastic process with hepatic metastases is also possible. Recommend further evaluation with MRI or CT with and without contrast for further evaluation. 2. Complex 2.1 cm left renal cyst, which could represent a hemorrhagic or proteinaceous cyst but is incompletely evaluated. This would additionally be evaluated on follow-up cross-sectional imaging.     Electronically Signed   By: Dahlia Bailiff MD   On: 11/24/2020 14:01 _______________________________________________  CLINICAL DATA:  Characterize liver lesions   EXAM: MRI ABDOMEN WITHOUT AND WITH CONTRAST (INCLUDING MRCP)    TECHNIQUE: Multiplanar multisequence MR imaging of the abdomen was performed both before and after the administration of intravenous contrast. Heavily T2-weighted images of the biliary and pancreatic ducts were obtained, and three-dimensional MRCP images were rendered by post processing.   CONTRAST:  27m MULTIHANCE GADOBENATE DIMEGLUMINE 529 MG/ML IV SOLN   COMPARISON:  Right upper quadrant ultrasound, 11/24/2020   FINDINGS: Lower chest: No acute findings.   Hepatobiliary: There are numerous, clustered, nearly confluent lesions of the anterior right and left lobes of the liver, the largest component centered about hepatic segment IVA/B and measuring at least 11.6 x 8.9 x 3.4 cm (series 19, image 57, series 14, image 27). An additional index nodule in the anterior left lobe of the liver, hepatic segment II, measures 2.7 x 2.5 x 2.7 cm (series 19, image 34). These demonstrate intermediate intrinsic T2 hyperintensity and hypoenhancement to liver parenchyma. There are numerous additional fluid signal cysts throughout the liver. No biliary ductal dilatation.   Pancreas: No mass, inflammatory changes, or other parenchymal abnormality identified. No pancreatic ductal dilatation.   Spleen:  Within normal limits in size and appearance.   Adrenals/Urinary Tract: No masses identified. No evidence of hydronephrosis.   Stomach/Bowel: Visualized portions within the abdomen are unremarkable.   Vascular/Lymphatic: Multiple abnormally enlarged, hypoenhancing portacaval and celiac axis/gastrohepatic ligament lymph nodes, largest node measuring 3.4 x 3.4 cm (series 19, image 43). No abdominal aortic aneurysm demonstrated.   Other:  None.   Musculoskeletal: No suspicious bone lesions identified.   IMPRESSION: 1. There are numerous, clustered, nearly confluent lesions of the anterior right and left lobes of the liver, hypoenhancing to liver parenchyma. 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 Metastatic disease or less likely hepatocellular carcinoma are differential considerations. 2. Multiple abnormally enlarged, hypoenhancing portacaval and celiac axis/gastrohepatic ligament lymph nodes, consistent with nodal metastatic disease.     Electronically Signed   By: AEddie CandleM.D.   On: 11/29/2020 19:01 ___________________________  (I personally reviewed the MRI images- HD) _____________________________________________ Assessment: Encounter Diagnoses  Name Primary?   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  Obviously advanced disease with multiple masses throughout the liver and adjacent adenopathy.  No extrahepatic biliary obstruction, not jaundiced, and very surprisingly recent liver labs are normal.  He has also been referred to  Dr. Julieanne Manson, but has not yet heard from their office.  I will certainly copy my note to Dr. Benay Spice to coordinate care.  Gilman needs a liver biopsy ASAP, and my staff will make arrangements for it.  Today we sent him to the lab for repeat CBC, CMP, PT/INR, CEA, CA 19-9 and AFP.  Total time 40 minutes, over half spent face-to-face with this patient.  Remainder in extensive chart review documentation and coordination of care.  Nelida Meuse III  CC: Referring provider noted above

## 2020-12-04 ENCOUNTER — Other Ambulatory Visit: Payer: Self-pay

## 2020-12-04 DIAGNOSIS — R160 Hepatomegaly, not elsewhere classified: Secondary | ICD-10-CM

## 2020-12-04 LAB — AFP TUMOR MARKER: AFP-Tumor Marker: 10.7 ng/mL — ABNORMAL HIGH (ref <6.1)

## 2020-12-04 LAB — COMPREHENSIVE METABOLIC PANEL
ALT: 17 U/L (ref 0–53)
AST: 22 U/L (ref 0–37)
Albumin: 4.6 g/dL (ref 3.5–5.2)
Alkaline Phosphatase: 80 U/L (ref 39–117)
BUN: 33 mg/dL — ABNORMAL HIGH (ref 6–23)
CO2: 25 mEq/L (ref 19–32)
Calcium: 10.7 mg/dL — ABNORMAL HIGH (ref 8.4–10.5)
Chloride: 98 mEq/L (ref 96–112)
Creatinine, Ser: 0.94 mg/dL (ref 0.40–1.50)
GFR: 81.49 mL/min (ref >60.00)
Glucose, Bld: 147 mg/dL — ABNORMAL HIGH (ref 70–99)
Potassium: 4.2 mEq/L (ref 3.5–5.1)
Sodium: 138 mEq/L (ref 135–145)
Total Bilirubin: 0.3 mg/dL (ref 0.2–1.2)
Total Protein: 8.2 g/dL (ref 6.0–8.3)

## 2020-12-04 LAB — PROTIME-INR
INR: 0.9 ratio (ref 0.8–1.0)
Prothrombin Time: 10.6 s (ref 9.6–13.1)

## 2020-12-04 LAB — CEA: CEA: 54.5 ng/mL — ABNORMAL HIGH

## 2020-12-04 LAB — CANCER ANTIGEN 19-9: CA 19-9: 60711 U/mL — ABNORMAL HIGH (ref <34)

## 2020-12-04 NOTE — Progress Notes (Signed)
Called and requested a CT liver biopsy. Awaiting return call to schedule

## 2020-12-05 ENCOUNTER — Telehealth: Payer: Self-pay

## 2020-12-05 ENCOUNTER — Encounter (HOSPITAL_COMMUNITY): Payer: Self-pay | Admitting: Radiology

## 2020-12-05 ENCOUNTER — Telehealth: Payer: Self-pay | Admitting: Oncology

## 2020-12-05 NOTE — Telephone Encounter (Signed)
Scheduled pt in first available slot with Dr. Benay Spice per 6/10 referral. Called pt, no answer. Left msg with appt date and time.

## 2020-12-05 NOTE — Progress Notes (Signed)
Vonte, Rossin Legal Sex  Male DOB  21-Nov-1950 SSN  [id number] Address  [place] [address] Phone  [phone] [facility])  [phone] (Mobile) *Preferred*     RE: CT LIVER MASS BIOPSY Received: Duaine Dredge, MD  [place], Janat Tabbert D Ok  [place] core liver lesion biopsy   DDH         Previous Messages    ----- Message -----  From: Garth Bigness D  Sent: 12/04/2020  12:28 PM EDT  To: Ir Procedure Requests  Subject: CT LIVER MASS BIOPSY                           Procedure:  CT LIVER MASS BIOPSY   Reason:  Liver mass   History:   MR, [place] in computer   Provider:  Doran Stabler   Provider Contact:  [phone]

## 2020-12-05 NOTE — Telephone Encounter (Signed)
Left voice message on patient identified voice mailbox with appointment with [name] NP on 12/15/2020 to arrive by 8:00 am at the [facility] location.  I explained that Dr. Benay Spice will see him as well.  I left my direct contact number to call back if he has any questions.

## 2020-12-07 ENCOUNTER — Other Ambulatory Visit: Payer: Self-pay | Admitting: Radiology

## 2020-12-08 ENCOUNTER — Other Ambulatory Visit: Payer: Self-pay | Admitting: Student

## 2020-12-11 ENCOUNTER — Ambulatory Visit (HOSPITAL_COMMUNITY)
Admission: RE | Admit: 2020-12-11 | Discharge: 2020-12-11 | Disposition: A | Discharge status: Home / Self Care | Payer: Medicare Other | Source: Ambulatory Visit | Attending: Gastroenterology | Admitting: Gastroenterology

## 2020-12-11 ENCOUNTER — Other Ambulatory Visit: Payer: Self-pay

## 2020-12-11 ENCOUNTER — Encounter (HOSPITAL_COMMUNITY): Payer: Self-pay

## 2020-12-11 DIAGNOSIS — C229 Malignant neoplasm of liver, not specified as primary or secondary: Secondary | ICD-10-CM | POA: Diagnosis not present

## 2020-12-11 DIAGNOSIS — R160 Hepatomegaly, not elsewhere classified: Secondary | ICD-10-CM | POA: Diagnosis present

## 2020-12-11 LAB — CBC
HCT: 39.4 % (ref 39.0–52.0)
Hemoglobin: 13.7 g/dL (ref 13.0–17.0)
MCH: 31.4 pg (ref 26.0–34.0)
MCHC: 34.8 g/dL (ref 30.0–36.0)
MCV: 90.4 fL (ref 80.0–100.0)
Platelets: 285 10*3/uL (ref 150–400)
RBC: 4.36 MIL/uL (ref 4.22–5.81)
RDW: 11.9 % (ref 11.5–15.5)
WBC: 9.3 10*3/uL (ref 4.0–10.5)
nRBC: 0 % (ref 0.0–0.2)

## 2020-12-11 LAB — PROTIME-INR
INR: 1 (ref 0.8–1.2)
Prothrombin Time: 13.4 seconds (ref 11.4–15.2)

## 2020-12-11 LAB — GLUCOSE, CAPILLARY: Glucose-Capillary: 153 mg/dL — ABNORMAL HIGH (ref 70–99)

## 2020-12-11 MED ORDER — MIDAZOLAM HCL 2 MG/2ML IJ SOLN
INTRAMUSCULAR | Status: AC
  Filled 2020-12-11: qty 2

## 2020-12-11 MED ORDER — MIDAZOLAM HCL 2 MG/2ML IJ SOLN
INTRAMUSCULAR | Status: AC | PRN
  Administered 2020-12-11: 1 mg via INTRAVENOUS

## 2020-12-11 MED ORDER — GELATIN ABSORBABLE 12-7 MM EX MISC
CUTANEOUS | Status: AC
  Filled 2020-12-11: qty 1

## 2020-12-11 MED ORDER — FENTANYL CITRATE (PF) 100 MCG/2ML IJ SOLN
INTRAMUSCULAR | Status: AC | PRN
  Administered 2020-12-11: 50 ug via INTRAVENOUS

## 2020-12-11 MED ORDER — FENTANYL CITRATE (PF) 100 MCG/2ML IJ SOLN
INTRAMUSCULAR | Status: AC
  Filled 2020-12-11: qty 2

## 2020-12-11 MED ORDER — LIDOCAINE HCL (PF) 1 % IJ SOLN
INTRAMUSCULAR | Status: AC
  Filled 2020-12-11: qty 30

## 2020-12-11 MED ORDER — SODIUM CHLORIDE 0.9 % IV SOLN: INTRAVENOUS | Status: DC

## 2020-12-11 NOTE — Procedures (Signed)
Interventional Radiology Procedure Note  Procedure: US guided biopsy of liver mass, right liver. Mx 54Y core Complications: None EBL: None Recommendations: - Bedrest 2 hours.   - Routine wound care - Follow up pathology - Advance diet   Signed,  Corrie Mckusick, DO

## 2020-12-11 NOTE — H&P (Signed)
Chief Complaint: Patient was seen in consultation today for liver lesion biopsy at the request of Danis,Henry L III  Referring Physician(s): Danis,Henry L III  Supervising Physician: Corrie Mckusick  Patient Status: [facility] - Out-pt  History of Present Illness: Erik Fletcher. is a 70 y.o. male   Pt developed abd pain - maybe 2 months ago Seemed to resolve on its own. 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 Has noted wt loss now 40 lbs or more. Was seen by PMD [place] ordered revealing liver lesion.  MR performed 11/29/20:IMPRESSION: 1. There are numerous, clustered, nearly confluent lesions of the anterior right and left lobes of the liver, hypoenhancing to liver parenchyma. This appearance and distribution strongly suggests cholangiocarcinoma. Metastatic disease or less likely hepatocellular carcinoma are differential considerations. 2. Multiple abnormally enlarged, hypoenhancing portacaval and celiac axis/gastrohepatic ligament lymph nodes, consistent with nodal metastatic disease.  Scheduled now for liver lesion biopsy per Dr Loletha Carrow request   Past Medical History:  Diagnosis Date   Allergy    seasonal allergies   Diabetes mellitus without complication ([place])    Glaucoma    Neuromuscular disorder ([place])    numbness/tingling legs to feet   PONV (postoperative nausea and vomiting)     Past Surgical History:  Procedure Laterality Date   BACK SURGERY     09/2009   BACK SURGERY  2013   CERVICAL SPINE SURGERY     2011   EYE SURGERY     09/2011 left eye   SHOULDER SURGERY     right  3/83    Allergies: Patient has no known allergies.  Medications: Prior to Admission medications   Medication Sig Start Date End Date Taking? Authorizing Provider  acetaminophen (TYLENOL) 500 MG tablet Take 1,000 mg by mouth every 6 (six) hours as needed for moderate pain or headache.   Yes [provider]  COMBIGAN 0.2-0.5 % ophthalmic solution  Place 1 drop into the right eye 2 (two) times daily. 09/09/19  Yes [provider]  EUTHYROX 50 MCG tablet Take 50 mcg by mouth every morning. 10/31/20  Yes [provider]  latanoprost (XALATAN) 0.005 % ophthalmic solution Place 1 drop into the right eye at bedtime.    Yes [provider]  metFORMIN (GLUCOPHAGE) 500 MG tablet Take 1 tablet (500 mg total) by mouth 2 (two) times daily with a meal. 10/12/19  Yes Rancour, Annie Main, MD  Multiple Vitamin (MULTIVITAMIN PO) Take 1 tablet by mouth daily.   Yes [provider]  phenylephrine (4-WAY FAST ACTING) 1 % nasal spray Place 1 drop into both nostrils daily as needed for congestion.   Yes [provider]  Polyethyl Glyc-Propyl Glyc PF (SYSTANE ULTRA PF) 0.4-0.3 % SOLN Place 1 drop into both eyes daily as needed (dryness).   Yes [provider]  RESTASIS 0.05 % ophthalmic emulsion Place 1 drop into both eyes 2 (two) times daily. 04/17/15  Yes [provider]  triamcinolone cream (KENALOG) 0.1 % Apply 1 application topically in the morning and at bedtime. 12/04/20  Yes [provider]  glucose monitoring kit (FREESTYLE) monitoring kit 1 each by Does not apply route as needed for other. 10/12/19   Rancour, Annie Main, MD  ibuprofen (ADVIL) 200 MG tablet Take 400 mg by mouth every 6 (six) hours as needed for headache or moderate pain.    [provider]     Family History  Problem Relation Age of Onset  Heart disease Mother    Dementia Father    Heart disease Father    Prostate cancer Brother    Other Daughter    Other Daughter    Colon cancer Neg Hx    Colon polyps Neg Hx    Esophageal cancer Neg Hx    Rectal cancer Neg Hx    Stomach cancer Neg Hx     Social History   Socioeconomic History   Marital status: Married    Spouse name: Not on file   Number of children: 2   Years of education: Not on file   Highest education level: Not on file  Occupational History    Occupation: retired  Tobacco Use   Smoking status: Never   Smokeless tobacco: Never  Vaping Use   Vaping Use: Never used  Substance and Sexual Activity   Alcohol use: No   Drug use: No   Sexual activity: Yes  Other Topics Concern   Not on file  Social History Narrative   Not on file   Social Determinants of Health   Financial Resource Strain: Not on file  Food Insecurity: Not on file  Transportation Needs: Not on file  Physical Activity: Not on file  Stress: Not on file  Social Connections: Not on file     Review of Systems: A 12 point ROS discussed and pertinent positives are indicated in the HPI above.  All other systems are negative.  Review of Systems  Constitutional:  Positive for appetite change and unexpected weight change. Negative for activity change and fever.  Respiratory:  Negative for cough and shortness of breath.   Cardiovascular:  Negative for chest pain.  Gastrointestinal:  Positive for abdominal pain.  Musculoskeletal:  Negative for back pain.  Psychiatric/Behavioral:  Negative for behavioral problems and confusion.    Vital Signs: BP (!) 129/98   Pulse 94   Temp 98.4 F (36.9 C) (Oral)   Resp 18   Ht 5' 8"  (1.727 m)   Wt 180 lb (81.6 kg)   SpO2 100%   BMI 27.37 kg/m   Physical Exam Vitals reviewed.  HENT:     Mouth/Throat:     Mouth: Mucous membranes are moist.  Cardiovascular:     Rate and Rhythm: Normal rate and regular rhythm.     Heart sounds: Normal heart sounds.  Pulmonary:     Effort: Pulmonary effort is normal.     Breath sounds: Normal breath sounds.  Abdominal:     Palpations: Abdomen is soft.     Tenderness: There is abdominal tenderness.  Musculoskeletal:        General: Normal range of motion.  Skin:    General: Skin is warm.  Neurological:     Mental Status: He is alert and oriented to person, place, and time.  Psychiatric:        Behavior: Behavior normal.    Imaging: US Abdomen Complete  Result Date:  11/24/2020 CLINICAL DATA:  Right upper quadrant abdominal pain EXAM: ABDOMEN ULTRASOUND COMPLETE COMPARISON:  None. FINDINGS: Gallbladder: No gallstones or wall thickening visualized. No sonographic Murphy sign noted by sonographer. Common bile duct: Diameter: 3 mm Liver: Coarsened inhomogeneous hepatic echotexture. Bilobar hepatic masses the largest is in infiltrative appearing heterogeneously hypoechoic lesion in the right lobe of the liver measuring approximately 10.7 x 10.3 x 4.5 cm and in the left lobe of the liver there is a 3.3 cm rounded heterogeneous lesion. Complex cysts in the left lobe of the liver measuring  up to 2.7 cm. 1.1 cm shadowing calcification in the right lobe of the liver. Portal vein is patent on color Doppler imaging with normal direction of blood flow towards the liver. IVC: No abnormality visualized. Pancreas: Solid heterogeneous mass superior to the pancreatic head measuring 3.7 x 3.6 x 3.3 cm. Spleen: Size and appearance within normal limits. Right Kidney: Length: 10.9 cm. Echogenicity within normal limits. No mass or hydronephrosis visualized. Left Kidney: Length: 10.6 cm. Echogenicity within normal limits. 2.1 cm complex left renal cyst. Additional 1 cm exophytic cyst. No mass or hydronephrosis visualized. Abdominal aorta: No aneurysm visualized. Other findings: None. IMPRESSION: 1. Solid bilobar hepatic lesions including a large infiltrative lesion in the right lobe of the liver as well as a heterogeneous mass just superior to the pancreatic head, findings which are nonspecific but concerning for a neoplastic process. 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 However, other neoplastic process with hepatic metastases is also possible. Recommend further evaluation with MRI or CT with and without contrast  for further evaluation. 2. Complex 2.1 cm left renal cyst, which could represent a hemorrhagic or proteinaceous cyst but is incompletely evaluated. This would additionally be evaluated on follow-up cross-sectional imaging. Electronically Signed   By: Dahlia Bailiff MD   On: 11/24/2020 14:01   MR ABDOMEN MRCP W WO CONTAST  Result Date: 11/29/2020 CLINICAL DATA:  Characterize liver lesions EXAM: MRI ABDOMEN WITHOUT AND WITH CONTRAST (INCLUDING MRCP) TECHNIQUE: Multiplanar multisequence MR imaging of the abdomen was performed both before and after the administration of intravenous contrast. Heavily T2-weighted images of the biliary and pancreatic ducts were obtained, and three-dimensional MRCP images were rendered by post processing. CONTRAST:  10m MULTIHANCE GADOBENATE DIMEGLUMINE 529 MG/ML IV SOLN COMPARISON:  Right upper quadrant ultrasound, 11/24/2020 FINDINGS: Lower chest: No acute findings. Hepatobiliary: There are numerous, clustered, nearly confluent lesions of the anterior right and left lobes of the liver, the largest component centered about hepatic segment IVA/B and measuring at least 11.6 x 8.9 x 3.4 cm (series 19, image 57, series 14, image 27). An additional index nodule in the anterior left lobe of the liver, hepatic segment II, measures 2.7 x 2.5 x 2.7 cm (series 19, image 34). These demonstrate intermediate intrinsic T2 hyperintensity and hypoenhancement to liver parenchyma. There are numerous additional fluid signal cysts throughout the liver. No biliary ductal dilatation. Pancreas: No mass, inflammatory changes, or other parenchymal abnormality identified. No pancreatic ductal dilatation. Spleen:  Within normal limits in size and appearance. Adrenals/Urinary Tract: No masses identified. No evidence of hydronephrosis. Stomach/Bowel: Visualized portions within the abdomen are unremarkable. Vascular/Lymphatic: Multiple abnormally enlarged, hypoenhancing portacaval and celiac axis/gastrohepatic  ligament lymph nodes, largest node measuring 3.4 x 3.4 cm (series 19, image 43). No abdominal aortic aneurysm demonstrated. Other:  None. Musculoskeletal: No suspicious bone lesions identified. IMPRESSION: 1. There are numerous, clustered, nearly confluent lesions of the anterior right and left lobes of the liver, hypoenhancing to liver parenchyma. This appearance and distribution strongly suggests cholangiocarcinoma. Metastatic disease or less likely hepatocellular carcinoma are differential considerations. 2. Multiple abnormally enlarged, hypoenhancing portacaval and celiac axis/gastrohepatic ligament lymph nodes, consistent with nodal metastatic disease. Electronically Signed   By: AEddie CandleM.D.   On: 11/29/2020 19:01    Labs:  CBC: Recent Labs    12/01/20 1702  WBC 8.1  HGB 13.9  HCT  40.1  PLT 278.0    COAGS: Recent Labs    12/01/20 1702  INR 0.9    BMP: Recent Labs    12/01/20 1702  NA 138  K 4.2  CL 98  CO2 25  GLUCOSE 147*  BUN 33*  CALCIUM 10.7*  CREATININE 0.94    LIVER FUNCTION TESTS: Recent Labs    12/01/20 1702  BILITOT 0.3  AST 22  ALT 17  ALKPHOS 80  PROT 8.2  ALBUMIN 4.6    TUMOR MARKERS: Recent Labs    12/01/20 1702  AFPTM 10.7*  CEA 54.5*  CA199 60,711*    Assessment and Plan:  Abd pain; wt loss Liver lesion +PET Scheduled for liver lesion biopsy Risks and benefits of liver lesion biopsy was discussed with the patient and/or patient's family including, but not limited to bleeding, infection, damage to adjacent structures or low yield requiring additional tests.  All of the questions were answered and there is agreement to proceed. Consent signed and in chart.   Thank you for this interesting consult.  I greatly enjoyed meeting Raquel Sayres. and look forward to participating in their care.  A copy of this report was sent to the requesting provider on this date.  Electronically Signed: Lavonia Drafts, PA-C 12/11/2020,  1:38 PM   I spent a total of  30 Minutes   in face to face in clinical consultation, greater than 50% of which was counseling/coordinating care for liver lesion bx

## 2020-12-14 ENCOUNTER — Telehealth: Payer: Self-pay | Admitting: *Deleted

## 2020-12-14 NOTE — Telephone Encounter (Signed)
Left VM to confirm new patient appointment on 12/15/20 at 0815 with Dr. Benay Spice. Requested he arrive at 0800 and bring current insurance card and vaccine card if he has one to update chart. Provided address and office location and to call for any questions or concerns.

## 2020-12-15 ENCOUNTER — Inpatient Hospital Stay: Payer: Medicare Other | Attending: Oncology | Admitting: Oncology

## 2020-12-15 ENCOUNTER — Other Ambulatory Visit: Payer: Self-pay

## 2020-12-15 ENCOUNTER — Other Ambulatory Visit: Payer: Self-pay | Admitting: Oncology

## 2020-12-15 VITALS — BP 104/84 | HR 101 | Temp 97.8°F | Resp 20 | Ht 68.0 in | Wt 175.0 lb

## 2020-12-15 DIAGNOSIS — G893 Neoplasm related pain (acute) (chronic): Secondary | ICD-10-CM | POA: Diagnosis not present

## 2020-12-15 DIAGNOSIS — R634 Abnormal weight loss: Secondary | ICD-10-CM | POA: Diagnosis not present

## 2020-12-15 DIAGNOSIS — C221 Intrahepatic bile duct carcinoma: Secondary | ICD-10-CM | POA: Insufficient documentation

## 2020-12-15 DIAGNOSIS — H409 Unspecified glaucoma: Secondary | ICD-10-CM | POA: Diagnosis not present

## 2020-12-15 DIAGNOSIS — Z7189 Other specified counseling: Secondary | ICD-10-CM

## 2020-12-15 LAB — SURGICAL PATHOLOGY

## 2020-12-15 MED ORDER — HYDROCODONE-ACETAMINOPHEN 2.5-325 MG PO TABS: 1.0000 | ORAL_TABLET | Freq: Four times a day (QID) | ORAL | 0 refills | Status: DC | PRN

## 2020-12-15 NOTE — Progress Notes (Signed)
START OFF PATHWAY REGIMEN - Other   [id number]:Cisplatin 80 mg/m2 IV D1 + Gemcitabine 1,000 mg/m2 IV D1,8 q21 Days:   A cycle is every 21 days:     Gemcitabine      Cisplatin   **Always confirm dose/schedule in your pharmacy ordering system**  Patient Characteristics: Intent of Therapy: Non-Curative / Palliative Intent, Discussed with Patient

## 2020-12-15 NOTE — Progress Notes (Signed)
Erik Fletcher Consult   Requesting MD: Lawerance Cruel, [address] [place],  [place] [address]   Erik Fletcher. 70 y.o.  11-29-1950    Reason for Consult: Cholangiocarcinoma   HPI: Mr. Erik Fletcher reports low back pain and abdominal pain beginning in April of this year.  He saw Dr. Harrington Challenger and was referred for an abdominal ultrasound.  The ultrasound on 11/24/2020 revealed bilobar hepatic masses including a 10.7 x 10.3 x 4.5 cm lesion in the right liver.  Complex cysts were noted in the left liver.  A solid mass was noted superior to the pancreas head. He was referred to Dr. Loletha Carrow.  An MRI of the abdomen on 11/29/2020 revealed numerous lesions in the right and left liver the largest measuring 11.6 x 8.9 x 3.4 cm in segments 4A/B.  Numerous liver cysts.  No biliary ductal dilatation.  No pancreas mass.  Multiple enlarged hypoenhancing portacaval and celiac axis/gastrohepatic nodes including a 3.4 x 3.4 cm node.  The lesions were felt to most likely represent cholangiocarcinoma. He was referred for an ultrasound-guided biopsy of a right liver mass on 12/11/2020.  The pathology revealed adenocarcinoma.  There is positive staining for cytokeratin 7, cytokeratin 20 with nonspecific Heppar1 staining.  The histology and immunohistochemistry are felt to be compatible with a pancreaticobiliary primary.    Past Medical History:  Diagnosis Date   Allergy    seasonal allergies   Diabetes mellitus without complication (H[facility])    Glaucoma    Neuromuscular disorder ([place])    numbness/tingling legs to feet   PONV (postoperative nausea and vomiting)     .  Hypothyroidism  Past Surgical History:  Procedure Laterality Date   BACK SURGERY     09/2009   BACK SURGERY  2013   CERVICAL SPINE SURGERY     2011   EYE SURGERY     09/2011 left eye   SHOULDER SURGERY     right  3/83    Medications: Reviewed  Allergies: No Known Allergies  Family history: His father  had prostate cancer when he was elderly  Social History:   He lives with his wife in [place].  He is retired from the [employer].  He does not use cigarettes or alcohol.  No transfusion history.  He has been a blood donor.  No risk factor for HIV or hepatitis.  He has not received the COVID-19 vaccine.  ROS:   Positives include: Anorexia, 40 pound weight loss over the past 1-2 years, right upper abdomen pain that is worse with inspiration and coughing  A complete ROS was otherwise negative.  Physical Exam:  Blood pressure 104/84, pulse (!) 101, temperature 97.8 F (36.6 C), resp. rate 20, height 5\' 8"  (1.727 m), weight 175 lb (79.4 kg), SpO2 98 %.  Lungs: Clear bilaterally Cardiac: Regular rate and rhythm Abdomen: The liver is palpable in the right mid subcostal area with associated tenderness, no splenomegaly, no apparent ascites GU: Testes without mass Vascular: No leg edema Lymph nodes: No cervical, supraclavicular, axillary, or inguinal nodes Neurologic: Alert and oriented, the motor exam appears intact in the upper and lower extremities bilaterally Skin: No rash Musculoskeletal: No spine tenderness   LAB:  CBC  Lab Results  Component Value Date   WBC 9.3 12/11/2020   HGB 13.7 12/11/2020   HCT 39.4 12/11/2020   MCV 90.4 12/11/2020   PLT 285 12/11/2020   NEUTROABS  5.7 12/01/2020        CMP  Lab Results  Component Value Date   NA 138 12/01/2020   K 4.2 12/01/2020   CL 98 12/01/2020   CO2 25 12/01/2020   GLUCOSE 147 (H) 12/01/2020   BUN 33 (H) 12/01/2020   CREATININE 0.94 12/01/2020   CALCIUM 10.7 (H) 12/01/2020   PROT 8.2 12/01/2020   ALBUMIN 4.6 12/01/2020   AST 22 12/01/2020   ALT 17 12/01/2020   ALKPHOS 80 12/01/2020   BILITOT 0.3 12/01/2020   GFRNONAA >60 10/11/2019   GFRAA >60 10/11/2019      Imaging: As per HPI, MRI abdomen 11/29/2020 -images reviewed with Mr. Howk and his  daughter    Assessment/Plan:   Intrahepatic cholangiocarcinoma, clinical stage IIIb (T2N1M0) Abdominal ultrasound 11/24/2020-by lobar hepatic lesions including a large infiltrative right liver lesion and heterogenous mass superior to the pancreas head MRI abdomen 11/29/2020-numerous hypoenhancing by lobar liver lesions suggestive of cholangiocarcinoma, multiple abnormal portacaval and celiac axis/gastropathic ligament nodes consistent with metastatic disease Ultrasound-guided biopsy of a right liver lesion 12/11/2020-adenocarcinoma, immunohistochemical profile and morphology consistent with a pancreaticobiliary primary Markedly elevated CA 19-9 Glaucoma Anorexia/weight loss secondary to #1 Pain secondary to #1   Disposition:   [name] presented with right upper abdominal pain.  An ultrasound and MRI revealed multiple liver lesions and abdominal adenopathy.  Biopsy of a right liver lesion reveals adenocarcinoma.  The clinical presentation and pathology are consistent with a diagnosis of intrahepatic cholangiocarcinoma.  He does not have risk factors for hepatocellular carcinoma.  He had a negative colonoscopy 1 year ago.  He appears to have advanced stage disease and will not be a candidate for surgery or hepatic directed therapy.  I will present his case at the GI tumor conference to confirm he is not a surgical candidate.  I discussed treatment options with Mr. Ruben and his daughter.  They understand no therapy will be curative.  We discussed supportive care versus a trial of systemic therapy.  We discussed standard treatment with gemcitabine/cisplatin and recent data supporting the addition of durvalumab.  He understands the goal of systemic therapy is to palliate symptoms and prolong survival.  We reviewed potential toxicities associated with the gemcitabine/cisplatin regimen including the chance of nausea/vomiting, alopecia, and hematologic toxicity.  We discussed the rash, fever, and  pneumonitis associated with gemcitabine.  We reviewed the ototoxicity, nephrotoxicity, and neuropathy seen with cisplatin.  We discussed the allergic reaction and various autoimmune toxicities seen with Durvalumab.  He agrees to proceed.  He will attend a chemotherapy teaching class.   We will submit the liver biopsy material for next generation sequencing to see if he will be a candidate for targeted therapy in the future.  Mr. Fricker will be referred for Port-A-Cath placement.  The plan is to begin systemic therapy on 12/28/2020.  I prescribed hydrocodone to use as needed for pain.  A chemotherapy plan was entered today.  Betsy Coder, MD  12/15/2020, 4:12 PM

## 2020-12-18 ENCOUNTER — Telehealth: Payer: Self-pay | Admitting: *Deleted

## 2020-12-18 ENCOUNTER — Encounter: Payer: Self-pay | Admitting: *Deleted

## 2020-12-18 NOTE — Telephone Encounter (Signed)
Patient had called [facility] on 6/24 requesting change in hydrocodone 2.5/325 script due to [facility] does not have this strength in stock. Left VM requesting he call back to update on issue...was it resolved or does he still need MD to change script?

## 2020-12-18 NOTE — Progress Notes (Signed)
Per Dr. Benay Spice request: Email to [facility] Pathology requesting [employer] One Testing on case #[id number] dated 12/11/20. 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

## 2020-12-18 NOTE — Progress Notes (Signed)
The following biosimilar Ziextenzo ([id number]) has been selected for use in this patient per insurance.  Henreitta Leber, PharmD 12/18/20 @ [phone]

## 2020-12-20 ENCOUNTER — Ambulatory Visit (HOSPITAL_BASED_OUTPATIENT_CLINIC_OR_DEPARTMENT_OTHER)
Admission: RE | Admit: 2020-12-20 | Discharge: 2020-12-20 | Disposition: A | Discharge status: Home / Self Care | Payer: Medicare Other | Source: Ambulatory Visit | Attending: Oncology | Admitting: Oncology

## 2020-12-20 ENCOUNTER — Inpatient Hospital Stay: Payer: Medicare Other

## 2020-12-20 ENCOUNTER — Other Ambulatory Visit: Payer: Self-pay

## 2020-12-20 ENCOUNTER — Other Ambulatory Visit: Payer: Self-pay | Admitting: Oncology

## 2020-12-20 DIAGNOSIS — C221 Intrahepatic bile duct carcinoma: Secondary | ICD-10-CM

## 2020-12-20 MED ORDER — LIDOCAINE-PRILOCAINE 2.5-2.5 % EX CREA: 1.0000 "application " | TOPICAL_CREAM | CUTANEOUS | 2 refills | Status: DC

## 2020-12-20 MED ORDER — PROCHLORPERAZINE MALEATE 10 MG PO TABS: 10.0000 mg | ORAL_TABLET | Freq: Four times a day (QID) | ORAL | 1 refills | Status: DC | PRN

## 2020-12-20 MED ORDER — ONDANSETRON HCL 8 MG PO TABS: 8.0000 mg | ORAL_TABLET | Freq: Three times a day (TID) | ORAL | 1 refills | Status: DC | PRN

## 2020-12-20 MED ORDER — HYDROCODONE-ACETAMINOPHEN 5-325 MG PO TABS: 0.5000 | ORAL_TABLET | Freq: Four times a day (QID) | ORAL | 0 refills | Status: DC | PRN

## 2020-12-20 MED ORDER — IOHEXOL 300 MG/ML  SOLN
100.0000 mL | Freq: Once | INTRAMUSCULAR | Status: AC | PRN
  Administered 2020-12-20: 100 mL via INTRAVENOUS

## 2020-12-21 ENCOUNTER — Telehealth: Payer: Self-pay | Admitting: Oncology

## 2020-12-21 NOTE — Telephone Encounter (Signed)
Scheduled appt per 6/29 sch msg. Pt aware.  

## 2020-12-25 ENCOUNTER — Other Ambulatory Visit: Payer: Self-pay | Admitting: Oncology

## 2020-12-25 ENCOUNTER — Other Ambulatory Visit: Payer: Self-pay | Admitting: Student

## 2020-12-26 ENCOUNTER — Other Ambulatory Visit: Payer: Self-pay | Admitting: Student

## 2020-12-26 ENCOUNTER — Other Ambulatory Visit (HOSPITAL_COMMUNITY): Payer: Medicare Other

## 2020-12-27 ENCOUNTER — Other Ambulatory Visit: Payer: Self-pay

## 2020-12-27 ENCOUNTER — Ambulatory Visit (HOSPITAL_COMMUNITY)
Admission: RE | Admit: 2020-12-27 | Discharge: 2020-12-27 | Disposition: A | Discharge status: Home / Self Care | Payer: Medicare Other | Source: Ambulatory Visit | Attending: Oncology | Admitting: Oncology

## 2020-12-27 ENCOUNTER — Other Ambulatory Visit: Payer: Self-pay | Admitting: Oncology

## 2020-12-27 ENCOUNTER — Encounter (HOSPITAL_COMMUNITY): Payer: Self-pay

## 2020-12-27 DIAGNOSIS — Z79899 Other long term (current) drug therapy: Secondary | ICD-10-CM | POA: Insufficient documentation

## 2020-12-27 DIAGNOSIS — Z7984 Long term (current) use of oral hypoglycemic drugs: Secondary | ICD-10-CM | POA: Diagnosis not present

## 2020-12-27 DIAGNOSIS — C221 Intrahepatic bile duct carcinoma: Secondary | ICD-10-CM | POA: Diagnosis not present

## 2020-12-27 HISTORY — PX: IR IMAGING GUIDED PORT INSERTION: IMG5740

## 2020-12-27 LAB — GLUCOSE, CAPILLARY: Glucose-Capillary: 228 mg/dL — ABNORMAL HIGH (ref 70–99)

## 2020-12-27 MED ORDER — HEPARIN SOD (PORK) LOCK FLUSH 100 UNIT/ML IV SOLN
INTRAVENOUS | Status: AC
  Filled 2020-12-27: qty 5

## 2020-12-27 MED ORDER — FENTANYL CITRATE (PF) 100 MCG/2ML IJ SOLN
INTRAMUSCULAR | Status: AC | PRN
  Administered 2020-12-27: 50 ug via INTRAVENOUS

## 2020-12-27 MED ORDER — SODIUM CHLORIDE 0.9 % IV SOLN: INTRAVENOUS | Status: DC

## 2020-12-27 MED ORDER — LIDOCAINE-EPINEPHRINE 1 %-1:100000 IJ SOLN
INTRAMUSCULAR | Status: AC | PRN
  Administered 2020-12-27 (×2): 10 mL via INTRADERMAL

## 2020-12-27 MED ORDER — HEPARIN SOD (PORK) LOCK FLUSH 100 UNIT/ML IV SOLN
INTRAVENOUS | Status: AC | PRN
  Administered 2020-12-27: 500 [IU] via INTRAVENOUS

## 2020-12-27 MED ORDER — MIDAZOLAM HCL 2 MG/2ML IJ SOLN
INTRAMUSCULAR | Status: AC
  Filled 2020-12-27: qty 2

## 2020-12-27 MED ORDER — FENTANYL CITRATE (PF) 100 MCG/2ML IJ SOLN
INTRAMUSCULAR | Status: AC
  Filled 2020-12-27: qty 2

## 2020-12-27 MED ORDER — MIDAZOLAM HCL 2 MG/2ML IJ SOLN
INTRAMUSCULAR | Status: AC | PRN
  Administered 2020-12-27 (×2): 1 mg via INTRAVENOUS

## 2020-12-27 MED ORDER — LIDOCAINE-EPINEPHRINE 1 %-1:100000 IJ SOLN
INTRAMUSCULAR | Status: AC
  Filled 2020-12-27: qty 1

## 2020-12-27 NOTE — H&P (Signed)
Referring Physician(s): Ladell Pier  Supervising Physician: Jacqulynn Cadet  Patient Status:  Erik Fletcher OP  Chief Complaint:  "I'm here for a port a cath"   Subjective: Patient familiar to IR service from liver mass biopsy on  12/11/2020.  He has a history of newly diagnosed cholangiocarcinoma and presents again today for Port-A-Cath placement to assist with treatment.  He currently denies fever, headache, chest pain, dyspnea, cough, back pain, nausea, vomiting or bleeding.  He does have some occasional right upper quadrant discomfort.  Additional history as below.  Past Medical History:  Diagnosis Date   Allergy    seasonal allergies   Diabetes mellitus without complication ([place])    Glaucoma    Neuromuscular disorder ([place])    numbness/tingling legs to feet   PONV (postoperative nausea and vomiting)    Past Surgical History:  Procedure Laterality Date   BACK SURGERY     09/2009   BACK SURGERY  2013   CERVICAL SPINE SURGERY     2011   EYE SURGERY     09/2011 left eye   SHOULDER SURGERY     right  3/83       Allergies: Patient has no known allergies.  Medications: Prior to Admission medications   Medication Sig Start Date End Date Taking? Authorizing Provider  acetaminophen (TYLENOL) 500 MG tablet Take 1,000 mg by mouth every 6 (six) hours as needed for moderate pain or headache.    [provider]  COMBIGAN 0.2-0.5 % ophthalmic solution Place 1 drop into the right eye 2 (two) times daily. 09/09/19   [provider]  EUTHYROX 50 MCG tablet Take 50 mcg by mouth every morning. 10/31/20   [provider]  glucose monitoring kit (FREESTYLE) monitoring kit 1 each by Does not apply route as needed for other. 10/12/19   Rancour, Annie Main, MD  HYDROcodone-acetaminophen (NORCO) 5-325 MG tablet Take 0.5-1 tablets by mouth every 6 (six) hours as needed for moderate pain. 12/20/20   Ladell Pier, MD  ibuprofen (ADVIL) 200 MG tablet Take 400 mg by  mouth every 6 (six) hours as needed for headache or moderate pain.    [provider]  latanoprost (XALATAN) 0.005 % ophthalmic solution Place 1 drop into the right eye at bedtime.     [provider]  lidocaine-prilocaine (EMLA) cream Apply 1 application topically as directed. Apply to port site 2 hours prior to stick and cover with plastic wrap to numb site 12/20/20   Ladell Pier, MD  metFORMIN (GLUCOPHAGE) 500 MG tablet Take 1 tablet (500 mg total) by mouth 2 (two) times daily with a meal. 10/12/19   Rancour, Annie Main, MD  Multiple Vitamin (MULTIVITAMIN PO) Take 1 tablet by mouth daily.    [provider]  ondansetron (ZOFRAN) 8 MG tablet Take 1 tablet (8 mg total) by mouth every 8 (eight) hours as needed for nausea or vomiting. Start 72 hours after each IV chemotherapy treatment 12/20/20   Ladell Pier, MD  phenylephrine (4-WAY FAST ACTING) 1 % nasal spray Place 1 drop into both nostrils daily as needed for congestion.    [provider]  Polyethyl Glyc-Propyl Glyc PF (SYSTANE ULTRA PF) 0.4-0.3 % SOLN Place 1 drop into both eyes daily as needed (dryness).    [provider]  prochlorperazine (COMPAZINE) 10 MG tablet Take 1 tablet (10 mg total) by mouth every 6 (six) hours as needed for nausea or vomiting. 12/20/20   Ladell Pier, MD  RESTASIS 0.05 % ophthalmic emulsion Place 1 drop into both eyes 2 (two) times daily. 04/17/15   [provider]  triamcinolone cream (KENALOG) 0.1 % Apply 1 application topically in the morning and at bedtime. 12/04/20   [provider]     Vital Signs: Vitals:   12/27/20 1226  BP: 120/79  Pulse: 89  Resp: 18  Temp: 98.3 F (36.8 C)  SpO2: 100%      Physical Exam awake, alert.  Chest clear to auscultation bilaterally.  Heart with regular rate and rhythm.  Abdomen soft, positive bowel sounds, mildly tender right upper quadrant to palpation.  No lower extremity edema.  Imaging: No  results found.  Labs:  CBC: Recent Labs    12/01/20 1702 12/11/20 1331  WBC 8.1 9.3  HGB 13.9 13.7  HCT 40.1 39.4  PLT 278.0 285    COAGS: Recent Labs    12/01/20 1702 12/11/20 1331  INR 0.9 1.0    BMP: Recent Labs    12/01/20 1702  NA 138  K 4.2  CL 98  CO2 25  GLUCOSE 147*  BUN 33*  CALCIUM 10.7*  CREATININE 0.94    LIVER FUNCTION TESTS: Recent Labs    12/01/20 1702  BILITOT 0.3  AST 22  ALT 17  ALKPHOS 80  PROT 8.2  ALBUMIN 4.6    Assessment and Plan: Patient familiar to IR service from liver mass biopsy on  12/11/2020.  He has a history of newly diagnosed cholangiocarcinoma and presents again today for Port-A-Cath placement to assist with treatment. Risks and benefits of image guided port-a-catheter placement was discussed with the patient including, but not limited to bleeding, infection, pneumothorax, or fibrin sheath development and need for additional procedures.  All of the patient's questions were answered, patient is agreeable to proceed. Consent signed and in chart.    Electronically Signed: D. Rowe Robert, PA-C 12/27/2020, 12:17 PM   I spent a total of 25 minutes at the the patient's bedside AND on the patient's hospital floor or unit, greater than 50% of which was counseling/coordinating care for Port-A-Cath placement

## 2020-12-27 NOTE — Progress Notes (Signed)
The proposed treatment discussed in conference is for discussion purpose only and is not a binding recommendation.  The patients have not been physically examined, or presented with their treatment options.  Therefore, final treatment plans cannot be decided.  

## 2020-12-27 NOTE — Progress Notes (Signed)
Pharmacist Chemotherapy Monitoring - Initial Assessment    Anticipated start date: 12/28/20   The following has been reviewed per standard work regarding the patient's treatment regimen: The patient's diagnosis, treatment plan and drug doses, and organ/hematologic function Lab orders and baseline tests specific to treatment regimen  The treatment plan start date, drug sequencing, and pre-medications Prior authorization status  Patient's documented medication list, including drug-drug interaction screen and prescriptions for anti-emetics and supportive care specific to the treatment regimen The drug concentrations, fluid compatibility, administration routes, and timing of the medications to be used The patient's access for treatment and lifetime cumulative dose history, if applicable  The patient's medication allergies and previous infusion related reactions, if applicable   Changes made to treatment plan:  N/A  Follow up needed:  N/A   Erik Fletcher, [place], 12/27/2020  3:02 PM

## 2020-12-27 NOTE — Procedures (Signed)
Interventional Radiology Procedure Note  Procedure: Placement of a right IJ approach single lumen PowerPort.  Tip is positioned at the superior cavoatrial junction and catheter is ready for immediate use.   Complications: No immediate  EBL: None  Recommendations:  - Ok to shower tomorrow - Do not submerge for 7 days - Routine line care    Signed,  Blaize Nipper K. Tahari Clabaugh, MD   

## 2020-12-27 NOTE — Discharge Instructions (Signed)

## 2020-12-28 ENCOUNTER — Inpatient Hospital Stay: Payer: Medicare Other

## 2020-12-28 ENCOUNTER — Inpatient Hospital Stay (HOSPITAL_BASED_OUTPATIENT_CLINIC_OR_DEPARTMENT_OTHER): Payer: Medicare Other | Admitting: Oncology

## 2020-12-28 ENCOUNTER — Other Ambulatory Visit: Payer: Self-pay

## 2020-12-28 ENCOUNTER — Inpatient Hospital Stay: Payer: Medicare Other | Attending: Oncology

## 2020-12-28 VITALS — BP 104/81 | HR 60 | Temp 98.2°F | Resp 19 | Ht 68.0 in | Wt 179.0 lb

## 2020-12-28 DIAGNOSIS — Z79899 Other long term (current) drug therapy: Secondary | ICD-10-CM | POA: Insufficient documentation

## 2020-12-28 DIAGNOSIS — C221 Intrahepatic bile duct carcinoma: Secondary | ICD-10-CM

## 2020-12-28 DIAGNOSIS — H409 Unspecified glaucoma: Secondary | ICD-10-CM | POA: Insufficient documentation

## 2020-12-28 DIAGNOSIS — Z5111 Encounter for antineoplastic chemotherapy: Secondary | ICD-10-CM | POA: Insufficient documentation

## 2020-12-28 DIAGNOSIS — Z5112 Encounter for antineoplastic immunotherapy: Secondary | ICD-10-CM | POA: Insufficient documentation

## 2020-12-28 LAB — CBC WITH DIFFERENTIAL (CANCER CENTER ONLY)
Abs Immature Granulocytes: 0.03 10*3/uL (ref 0.00–0.07)
Basophils Absolute: 0 10*3/uL (ref 0.0–0.1)
Basophils Relative: 0 %
Eosinophils Absolute: 0.3 10*3/uL (ref 0.0–0.5)
Eosinophils Relative: 3 %
HCT: 35.9 % — ABNORMAL LOW (ref 39.0–52.0)
Hemoglobin: 12.5 g/dL — ABNORMAL LOW (ref 13.0–17.0)
Immature Granulocytes: 0 %
Lymphocytes Relative: 11 %
Lymphs Abs: 1.1 10*3/uL (ref 0.7–4.0)
MCH: 31.5 pg (ref 26.0–34.0)
MCHC: 34.8 g/dL (ref 30.0–36.0)
MCV: 90.4 fL (ref 80.0–100.0)
Monocytes Absolute: 0.9 10*3/uL (ref 0.1–1.0)
Monocytes Relative: 9 %
Neutro Abs: 7.8 10*3/uL — ABNORMAL HIGH (ref 1.7–7.7)
Neutrophils Relative %: 77 %
Platelet Count: 292 10*3/uL (ref 150–400)
RBC: 3.97 MIL/uL — ABNORMAL LOW (ref 4.22–5.81)
RDW: 12.2 % (ref 11.5–15.5)
WBC Count: 10.1 10*3/uL (ref 4.0–10.5)
nRBC: 0 % (ref 0.0–0.2)

## 2020-12-28 LAB — CMP (CANCER CENTER ONLY)
ALT: 19 U/L (ref 0–44)
AST: 21 U/L (ref 15–41)
Albumin: 4 g/dL (ref 3.5–5.0)
Alkaline Phosphatase: 84 U/L (ref 38–126)
Anion gap: 10 (ref 5–15)
BUN: 38 mg/dL — ABNORMAL HIGH (ref 8–23)
CO2: 29 mmol/L (ref 22–32)
Calcium: 10.2 mg/dL (ref 8.9–10.3)
Chloride: 98 mmol/L (ref 98–111)
Creatinine: 0.85 mg/dL (ref 0.61–1.24)
GFR, Estimated: >60 mL/min (ref >60)
Glucose, Bld: 253 mg/dL — ABNORMAL HIGH (ref 70–99)
Potassium: 4.5 mmol/L (ref 3.5–5.1)
Sodium: 137 mmol/L (ref 135–145)
Total Bilirubin: 0.5 mg/dL (ref 0.3–1.2)
Total Protein: 7.3 g/dL (ref 6.5–8.1)

## 2020-12-28 LAB — MAGNESIUM: Magnesium: 1.6 mg/dL — ABNORMAL LOW (ref 1.7–2.4)

## 2020-12-28 MED ORDER — SODIUM CHLORIDE 0.9 % IV SOLN
10.0000 mg | Freq: Once | INTRAVENOUS | Status: AC
  Administered 2020-12-28: 10 mg via INTRAVENOUS
  Filled 2020-12-28: qty 1

## 2020-12-28 MED ORDER — SODIUM CHLORIDE 0.9 % IV SOLN
1000.0000 mg/m2 | Freq: Once | INTRAVENOUS | Status: AC
  Administered 2020-12-28: 1938 mg via INTRAVENOUS
  Filled 2020-12-28: qty 50.97

## 2020-12-28 MED ORDER — SODIUM CHLORIDE 0.9 % IV SOLN
1500.0000 mg | Freq: Once | INTRAVENOUS | Status: AC
  Administered 2020-12-28: 1500 mg via INTRAVENOUS
  Filled 2020-12-28: qty 30

## 2020-12-28 MED ORDER — PALONOSETRON HCL INJECTION 0.25 MG/5ML
0.2500 mg | Freq: Once | INTRAVENOUS | Status: AC
  Administered 2020-12-28: 0.25 mg via INTRAVENOUS
  Filled 2020-12-28: qty 5

## 2020-12-28 MED ORDER — HEPARIN SOD (PORK) LOCK FLUSH 100 UNIT/ML IV SOLN
500.0000 [IU] | Freq: Once | INTRAVENOUS | Status: AC | PRN
  Administered 2020-12-28: 500 [IU]
  Filled 2020-12-28: qty 5

## 2020-12-28 MED ORDER — SODIUM CHLORIDE 0.9 % IV SOLN
150.0000 mg | Freq: Once | INTRAVENOUS | Status: AC
  Administered 2020-12-28: 150 mg via INTRAVENOUS
  Filled 2020-12-28: qty 5

## 2020-12-28 MED ORDER — SODIUM CHLORIDE 0.9% FLUSH
10.0000 mL | INTRAVENOUS | Status: DC | PRN
  Administered 2020-12-28: 10 mL
  Filled 2020-12-28: qty 10

## 2020-12-28 MED ORDER — MAGNESIUM SULFATE 2 GM/50ML IV SOLN
2.0000 g | Freq: Once | INTRAVENOUS | Status: AC
  Administered 2020-12-28: 2 g via INTRAVENOUS
  Filled 2020-12-28: qty 50

## 2020-12-28 MED ORDER — SODIUM CHLORIDE 0.9 % IV SOLN
Freq: Once | INTRAVENOUS | Status: AC
Start: 2020-12-28 — End: 2020-12-28
  Filled 2020-12-28: qty 1000

## 2020-12-28 MED ORDER — SODIUM CHLORIDE 0.9 % IV SOLN
25.0000 mg/m2 | Freq: Once | INTRAVENOUS | Status: AC
  Administered 2020-12-28: 49 mg via INTRAVENOUS
  Filled 2020-12-28: qty 49

## 2020-12-28 MED ORDER — SODIUM CHLORIDE 0.9 % IV SOLN
Freq: Once | INTRAVENOUS | Status: AC
  Filled 2020-12-28: qty 250

## 2020-12-28 NOTE — Patient Instructions (Addendum)
[facility]  Discharge Instructions: Thank you for choosing [facility] to provide your oncology and hematology care.   If you have a lab appointment with the [facility], please go directly to the [facility] and check in at the registration area.   Wear comfortable clothing and clothing appropriate for easy access to any Portacath or PICC line.   We strive to give you quality time with your provider. You may need to reschedule your appointment if you arrive late (15 or more minutes).  Arriving late affects you and other patients whose appointments are after yours.  Also, if you miss three or more appointments without notifying the office, you may be dismissed from the clinic at the provider's discretion.      For prescription refill requests, have your pharmacy contact our office and allow 72 hours for refills to be completed.    Today you received the following chemotherapy and/or immunotherapy agents gemcitabine, cisplatin, durvalumab    To help prevent nausea and vomiting after your treatment, we encourage you to take your nausea medication as directed.  BELOW ARE SYMPTOMS THAT SHOULD BE REPORTED IMMEDIATELY: *FEVER GREATER THAN 100.4 F (38 C) OR HIGHER *CHILLS OR SWEATING *NAUSEA AND VOMITING THAT IS NOT CONTROLLED WITH YOUR NAUSEA MEDICATION *UNUSUAL SHORTNESS OF BREATH *UNUSUAL BRUISING OR BLEEDING *URINARY PROBLEMS (pain or burning when urinating, or frequent urination) *BOWEL PROBLEMS (unusual diarrhea, constipation, pain near the anus) TENDERNESS IN MOUTH AND THROAT WITH OR WITHOUT PRESENCE OF ULCERS (sore throat, sores in mouth, or a toothache) UNUSUAL RASH, SWELLING OR PAIN  UNUSUAL VAGINAL DISCHARGE OR ITCHING   Items with * indicate a potential emergency and should be followed up as soon as possible or go to the Emergency Department if any problems should occur.  Please show the CHEMOTHERAPY ALERT CARD or IMMUNOTHERAPY ALERT  CARD at check-in to the Emergency Department and triage nurse.  Should you have questions after your visit or need to cancel or reschedule your appointment, please contact [facility]  Dept: [phone]  and follow the prompts.  Office hours are 8:00 a.m. to 4:30 p.m. Monday - Friday. Please note that voicemails left after 4:00 p.m. may not be returned until the following business day.  We are closed weekends and major holidays. You have access to a nurse at all times for urgent questions. Please call the main number to the clinic Dept: [phone] and follow the prompts.   For any non-urgent questions, you may also contact your provider using MyChart. We now offer e-Visits for anyone 38 and older to request care online for non-urgent symptoms. For details visit [email or web address].   Also download the MyChart app! Go to the app store, search "MyChart", open the app, select [facility], and log in with your MyChart username and password.  Due to Covid, a mask is required upon entering the hospital/clinic. If you do not have a mask, one will be given to you upon arrival. For doctor visits, patients may have 1 support person aged 33 or older with them. For treatment visits, patients cannot have anyone with them due to current Covid guidelines and our immunocompromised population.   Gemcitabine injection What is this medication? GEMCITABINE (jem SYE ta been) is a chemotherapy drug. This medicine is used to treat many types of cancer like breast cancer, lung cancer, pancreatic cancer,and ovarian cancer. This medicine may be used for other purposes; ask your health care provider orpharmacist if  you have questions. COMMON BRAND NAME(S): Gemzar, Infugem What should I tell my care team before I take this medication? They need to know if you have any of these conditions: blood disorders infection kidney disease liver disease lung or breathing disease, like asthma recent  or ongoing radiation therapy an unusual or allergic reaction to gemcitabine, other chemotherapy, other medicines, foods, dyes, or preservatives pregnant or trying to get pregnant breast-feeding How should I use this medication? This drug is given as an infusion into a vein. It is administered in a hospitalor clinic by a specially trained health care professional. Talk to your pediatrician regarding the use of this medicine in children.Special care may be needed. Overdosage: If you think you have taken too much of this medicine contact apoison control center or emergency room at once. NOTE: This medicine is only for you. Do not share this medicine with others. What if I miss a dose? It is important not to miss your dose. Call your doctor or health careprofessional if you are unable to keep an appointment. What may interact with this medication? medicines to increase blood counts like filgrastim, pegfilgrastim, sargramostim some other chemotherapy drugs like cisplatin vaccines Talk to your doctor or health care professional before taking any of thesemedicines: acetaminophen aspirin ibuprofen ketoprofen naproxen This list may not describe all possible interactions. Give your health care provider a list of all the medicines, herbs, non-prescription drugs, or dietary supplements you use. Also tell them if you smoke, drink alcohol, or use illegaldrugs. Some items may interact with your medicine. What should I watch for while using this medication? Visit your doctor for checks on your progress. This drug may make you feel generally unwell. This is not uncommon, as chemotherapy can affect healthy cells as well as cancer cells. Report any side effects. Continue your course oftreatment even though you feel ill unless your doctor tells you to stop. In some cases, you may be given additional medicines to help with side effects.Follow all directions for their use. Call your doctor or health care  professional for advice if you get a fever, chills or sore throat, or other symptoms of a cold or flu. Do not treat yourself. This drug decreases your body's ability to fight infections. Try toavoid being around people who are sick. This medicine may increase your risk to bruise or bleed. Call your doctor orhealth care professional if you notice any unusual bleeding. Be careful brushing and flossing your teeth or using a toothpick because you may get an infection or bleed more easily. If you have any dental work done,tell your dentist you are receiving this medicine. Avoid taking products that contain aspirin, acetaminophen, ibuprofen, naproxen, or ketoprofen unless instructed by your doctor. These medicines may hide afever. Do not become pregnant while taking this medicine or for 6 months after stopping it. Women should inform their doctor if they wish to become pregnant or think they might be pregnant. Men should not father a child while taking this medicine and for 3 months after stopping it. There is a potential for serious side effects to an unborn child. Talk to your health care professional or pharmacist for more information. Do not breast-feed an infant while takingthis medicine or for at least 1 week after stopping it. Men should inform their doctors if they wish to father a child. This medicine may lower sperm counts. Talk with your doctor or health care professional ifyou are concerned about your fertility. What side effects may I notice from receiving  this medication? Side effects that you should report to your doctor or health care professionalas soon as possible: allergic reactions like skin rash, itching or hives, swelling of the face, lips, or tongue breathing problems pain, redness, or irritation at site where injected signs and symptoms of a dangerous change in heartbeat or heart rhythm like chest pain; dizziness; fast or irregular heartbeat; palpitations; feeling faint or lightheaded,  falls; breathing problems signs of decreased platelets or bleeding - bruising, pinpoint red spots on the skin, black, tarry stools, blood in the urine signs of decreased red blood cells - unusually weak or tired, feeling faint or lightheaded, falls signs of infection - fever or chills, cough, sore throat, pain or difficulty passing urine signs and symptoms of kidney injury like trouble passing urine or change in the amount of urine signs and symptoms of liver injury like dark yellow or brown urine; general ill feeling or flu-like symptoms; light-colored stools; loss of appetite; nausea; right upper belly pain; unusually weak or tired; yellowing of the eyes or skin swelling of ankles, feet, hands Side effects that usually do not require medical attention (report to yourdoctor or health care professional if they continue or are bothersome): constipation diarrhea hair loss loss of appetite nausea rash vomiting This list may not describe all possible side effects. Call your doctor for medical advice about side effects. You may report side effects to FDA at1-800-FDA-1088. Where should I keep my medication? This drug is given in a hospital or clinic and will not be stored at home. NOTE: This sheet is a summary. It may not cover all possible information. If you have questions about this medicine, talk to your doctor, pharmacist, orhealth care provider.  2022 Elsevier/Gold Standard (2017-09-03 18:06:11)  Cisplatin injection What is this medication? CISPLATIN (SIS pla tin) is a chemotherapy drug. It targets fast dividing cells, like cancer cells, and causes these cells to die. This medicine is used totreat many types of cancer like bladder, ovarian, and testicular cancers. This medicine may be used for other purposes; ask your health care provider orpharmacist if you have questions. COMMON BRAND NAME(S): Platinol, Platinol -AQ What should I tell my care team before I take this medication? They  need to know if you have any of these conditions: eye disease, vision problems hearing problems kidney disease low blood counts, like white cells, platelets, or red blood cells tingling of the fingers or toes, or other nerve disorder an unusual or allergic reaction to cisplatin, carboplatin, oxaliplatin, other medicines, foods, dyes, or preservatives pregnant or trying to get pregnant breast-feeding How should I use this medication? This drug is given as an infusion into a vein. It is administered in a hospitalor clinic by a specially trained health care professional. Talk to your pediatrician regarding the use of this medicine in children.Special care may be needed. Overdosage: If you think you have taken too much of this medicine contact apoison control center or emergency room at once. NOTE: This medicine is only for you. Do not share this medicine with others. What if I miss a dose? It is important not to miss a dose. Call your doctor or health careprofessional if you are unable to keep an appointment. What may interact with this medication? This medicine may interact with the following medications: foscarnet certain antibiotics like amikacin, gentamicin, neomycin, polymyxin B, streptomycin, tobramycin, vancomycin This list may not describe all possible interactions. Give your health care provider a list of all the medicines, herbs, non-prescription drugs, or dietary  supplements you use. Also tell them if you smoke, drink alcohol, or use illegaldrugs. Some items may interact with your medicine. What should I watch for while using this medication? Your condition will be monitored carefully while you are receiving this medicine. You will need important blood work done while you are taking thismedicine. This drug may make you feel generally unwell. This is not uncommon, as chemotherapy can affect healthy cells as well as cancer cells. Report any side effects. Continue your course of treatment  even though you feel ill unless yourdoctor tells you to stop. This medicine may increase your risk of getting an infection. Call your healthcare professional for advice if you get a fever, chills, or sore throat, or other symptoms of a cold or flu. Do not treat yourself. Try to avoid beingaround people who are sick. Avoid taking medicines that contain aspirin, acetaminophen, ibuprofen, naproxen, or ketoprofen unless instructed by your healthcare professional.These medicines may hide a fever. This medicine may increase your risk to bruise or bleed. Call your doctor orhealth care professional if you notice any unusual bleeding. Be careful brushing and flossing your teeth or using a toothpick because you may get an infection or bleed more easily. If you have any dental work done,tell your dentist you are receiving this medicine. Do not become pregnant while taking this medicine or for 14 months after stopping it. Women should inform their healthcare professional if they wish to become pregnant or think they might be pregnant. Men should not father a child while taking this medicine and for 11 months after stopping it. There is potential for serious side effects to an unborn child. Talk to your healthcareprofessional for more information. Do not breast-feed an infant while taking this medicine. This medicine has caused ovarian failure in some women. This medicine may make it more difficult to get pregnant. Talk to your healthcare professional if [place] concerned about your fertility. This medicine has caused decreased sperm counts in some men. This may make it more difficult to father a child. Talk to your healthcare professional if [place] concerned about your fertility. Drink fluids as directed while you are taking this medicine. This will helpprotect your kidneys. Call your doctor or health care professional if you get diarrhea. Do not treatyourself. What side effects may I notice from receiving this  medication? Side effects that you should report to your doctor or health care professionalas soon as possible: allergic reactions like skin rash, itching or hives, swelling of the face, lips, or tongue blurred vision changes in vision decreased hearing or ringing of the ears nausea, vomiting pain, redness, or irritation at site where injected pain, tingling, numbness in the hands or feet signs and symptoms of bleeding such as bloody or black, tarry stools; red or dark brown urine; spitting up blood or brown material that looks like coffee grounds; red spots on the skin; unusual bruising or bleeding from the eyes, gums, or nose signs and symptoms of infection like fever; chills; cough; sore throat; pain or trouble passing urine signs and symptoms of kidney injury like trouble passing urine or change in the amount of urine signs and symptoms of low red blood cells or anemia such as unusually weak or tired; feeling faint or lightheaded; falls; breathing problems Side effects that usually do not require medical attention (report to yourdoctor or health care professional if they continue or are bothersome): loss of appetite mouth sores muscle cramps This list may not describe all possible side effects. Call your  doctor for medical advice about side effects. You may report side effects to FDA at1-800-FDA-1088. Where should I keep my medication? This drug is given in a hospital or clinic and will not be stored at home. NOTE: This sheet is a summary. It may not cover all possible information. If you have questions about this medicine, talk to your doctor, pharmacist, orhealth care provider.  2022 Elsevier/Gold Standard (2018-06-05 15:59:17)  Durvalumab injection What is this medication? DURVALUMAB (dur VAL ue mab) is a monoclonal antibody. It is used to treat lungcancer. This medicine may be used for other purposes; ask your health care provider orpharmacist if you have questions. COMMON BRAND  NAME(S): IMFINZI What should I tell my care team before I take this medication? They need to know if you have any of these conditions: autoimmune diseases like Crohn's disease, ulcerative colitis, or lupus have had or planning to have an allogeneic stem cell transplant (uses someone else's stem cells) history of organ transplant history of radiation to the chest nervous system problems like myasthenia gravis or Guillain-Barre syndrome an unusual or allergic reaction to durvalumab, other medicines, foods, dyes, or preservatives pregnant or trying to get pregnant breast-feeding How should I use this medication? This medicine is for infusion into a vein. It is given by a health careprofessional in a hospital or clinic setting. A special MedGuide will be given to you before each treatment. Be sure to readthis information carefully each time. Talk to your pediatrician regarding the use of this medicine in children.Special care may be needed. Overdosage: If you think you have taken too much of this medicine contact apoison control center or emergency room at once. NOTE: This medicine is only for you. Do not share this medicine with others. What if I miss a dose? It is important not to miss your dose. Call your doctor or health careprofessional if you are unable to keep an appointment. What may interact with this medication? Interactions have not been studied. This list may not describe all possible interactions. Give your health care provider a list of all the medicines, herbs, non-prescription drugs, or dietary supplements you use. Also tell them if you smoke, drink alcohol, or use illegaldrugs. Some items may interact with your medicine. What should I watch for while using this medication? This drug may make you feel generally unwell. Continue your course of treatmenteven though you feel ill unless your doctor tells you to stop. You may need blood work done while you are taking this medicine. Do  not become pregnant while taking this medicine or for 3 months after stopping it. Women should inform their doctor if they wish to become pregnant or think they might be pregnant. There is a potential for serious side effects to an unborn child. Talk to your health care professional or pharmacist for more information. Do not breast-feed an infant while taking this medicine orfor 3 months after stopping it. What side effects may I notice from receiving this medication? Side effects that you should report to your doctor or health care professionalas soon as possible: allergic reactions like skin rash, itching or hives, swelling of the face, lips, or tongue black, tarry stools bloody or watery diarrhea breathing problems change in emotions or moods change in sex drive changes in vision chest pain or chest tightness chills confusion cough facial flushing fever headache signs and symptoms of high blood sugar such as dizziness; dry mouth; dry skin; fruity breath; nausea; stomach pain; increased hunger or thirst; increased urination signs  and symptoms of liver injury like dark yellow or brown urine; general ill feeling or flu-like symptoms; light-colored stools; loss of appetite; nausea; right upper belly pain; unusually weak or tired; yellowing of the eyes or skin stomach pain trouble passing urine or change in the amount of urine weight gain or weight loss Side effects that usually do not require medical attention (report these toyour doctor or health care professional if they continue or are bothersome): bone pain constipation loss of appetite muscle pain nausea swelling of the ankles, feet, hands tiredness This list may not describe all possible side effects. Call your doctor for medical advice about side effects. You may report side effects to FDA at1-800-FDA-1088. Where should I keep my medication? This drug is given in a hospital or clinic and will not be stored at home. NOTE: This  sheet is a summary. It may not cover all possible information. If you have questions about this medicine, talk to your doctor, pharmacist, orhealth care provider.  2022 Elsevier/Gold Standard (2019-08-19 13:01:29)

## 2020-12-28 NOTE — Progress Notes (Signed)
Ok per Lattie Haw T, NP to run hydration fluids with Cisplatin.

## 2020-12-28 NOTE — Progress Notes (Signed)
[place] OFFICE PROGRESS NOTE   Diagnosis: Cholangiocarcinoma  INTERVAL HISTORY:   Mr. Erik Fletcher returns as scheduled.  He underwent Port-A-Cath placement yesterday.  He continues to have discomfort in the right subcostal region.  He has attended a chemotherapy teaching class.  Objective:  Vital signs in last 24 hours:  Blood pressure 104/81, pulse 60, temperature 98.2 F (36.8 C), temperature source Oral, resp. rate 19, height 5\' 8"  (1.727 m), weight 179 lb (81.2 kg), SpO2 96 %.    Resp: Lungs clear bilaterally, no respiratory distress Cardio: Regular rate and rhythm GI: Masslike fullness in the right subcostal region Vascular: No leg edema    Portacath/PICC-without erythema  Lab Results:  Lab Results  Component Value Date   WBC 10.1 12/28/2020   HGB 12.5 (L) 12/28/2020   HCT 35.9 (L) 12/28/2020   MCV 90.4 12/28/2020   PLT 292 12/28/2020   NEUTROABS 7.8 (H) 12/28/2020    CMP  Lab Results  Component Value Date   NA 138 12/01/2020   K 4.2 12/01/2020   CL 98 12/01/2020   CO2 25 12/01/2020   GLUCOSE 147 (H) 12/01/2020   BUN 33 (H) 12/01/2020   CREATININE 0.94 12/01/2020   CALCIUM 10.7 (H) 12/01/2020   PROT 8.2 12/01/2020   ALBUMIN 4.6 12/01/2020   AST 22 12/01/2020   ALT 17 12/01/2020   ALKPHOS 80 12/01/2020   BILITOT 0.3 12/01/2020   GFRNONAA >60 10/11/2019   GFRAA >60 10/11/2019   Imaging:  IR IMAGING GUIDED PORT INSERTION  Result Date: 12/27/2020 INDICATION: 70 year old male with a history of cholangiocarcinoma. He presents for port catheter placement to establish durable venous access. EXAM: IMPLANTED PORT A CATH PLACEMENT WITH ULTRASOUND AND FLUOROSCOPIC GUIDANCE MEDICATIONS: None. ANESTHESIA/SEDATION: Versed 2 mg IV; Fentanyl 100 mcg IV; Moderate Sedation Time:  23 minutes The patient was continuously monitored during the procedure by the interventional radiology nurse under my direct supervision. FLUOROSCOPY TIME:  0 minutes, 12  seconds (2 mGy) COMPLICATIONS: None immediate. PROCEDURE: The right neck and chest was prepped with chlorhexidine, and draped in the usual sterile fashion using maximum barrier technique (cap and mask, sterile gown, sterile gloves, large sterile sheet, hand hygiene and cutaneous antiseptic). Local anesthesia was attained by infiltration with 1% lidocaine with epinephrine. Ultrasound demonstrated patency of the right internal jugular vein, and this was documented with an image. Under real-time ultrasound guidance, this vein was accessed with a 21 gauge micropuncture needle and image documentation was performed. A small dermatotomy was made at the access site with an 11 scalpel. A 0.018" wire was advanced into the SVC and the access needle exchanged for a 43F micropuncture vascular sheath. The 0.018" wire was then removed and a 0.035" wire advanced into the IVC. An appropriate location for the subcutaneous reservoir was selected below the clavicle and an incision was made through the skin and underlying soft tissues. The subcutaneous tissues were then dissected using a combination of blunt and sharp surgical technique and a pocket was formed. A single lumen power injectable portacatheter was then tunneled through the subcutaneous tissues from the pocket to the dermatotomy and the port reservoir placed within the subcutaneous pocket. The venous access site was then serially dilated and a peel away vascular sheath placed over the wire. The wire was removed and the port catheter advanced into position under fluoroscopic guidance. The catheter tip is positioned in the superior cavoatrial junction. This was documented with a spot image. The portacatheter was then tested and found  to flush and aspirate well. The port was flushed with saline followed by 100 units/mL heparinized saline. The pocket was then closed in two layers using first subdermal inverted interrupted absorbable sutures followed by a running subcuticular  suture. The epidermis was then sealed with Dermabond. The dermatotomy at the venous access site was also closed with Dermabond. IMPRESSION: Successful placement of a right IJ approach Power Port with ultrasound and fluoroscopic guidance. The catheter is ready for use. Electronically Signed   By: Jacqulynn Cadet M.D.   On: 12/27/2020 16:19    Medications: I have reviewed the patient's current medications.   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  He will begin systemic therapy with gemcitabine/cisplatin/Durvalumab today.  He has attended a chemotherapy teaching class. I encouraged him to use hydrocodone as needed for pain.  He will return for an office visit and day 8 chemotherapy on 01/05/2021.  Betsy Coder, MD  12/28/2020  8:31 AM

## 2020-12-29 ENCOUNTER — Ambulatory Visit: Payer: Medicare Other

## 2020-12-29 ENCOUNTER — Telehealth: Payer: Self-pay | Admitting: Emergency Medicine

## 2020-12-29 LAB — CANCER ANTIGEN 19-9: CA 19-9: 25775 U/mL — ABNORMAL HIGH (ref 0–35)

## 2020-12-29 NOTE — Telephone Encounter (Signed)
24 hour call back  Call to patient for 20 hour call back after receiving Gemzar and Cisplatin yesterday.  Pt states he is feeling "fine". Denies any N/V/D or any other symptoms.  Pt know to call or go to the emergency room as discussed.

## 2020-12-31 ENCOUNTER — Other Ambulatory Visit: Payer: Self-pay | Admitting: Oncology

## 2021-01-01 ENCOUNTER — Encounter: Payer: Medicare Other | Admitting: Nutrition

## 2021-01-01 ENCOUNTER — Encounter: Payer: Self-pay | Admitting: Nutrition

## 2021-01-01 NOTE — Progress Notes (Signed)
Patient cancelled nutrition appointment. 

## 2021-01-02 ENCOUNTER — Encounter (HOSPITAL_COMMUNITY): Payer: Self-pay

## 2021-01-05 ENCOUNTER — Inpatient Hospital Stay: Payer: Medicare Other

## 2021-01-05 ENCOUNTER — Other Ambulatory Visit: Payer: Self-pay

## 2021-01-05 ENCOUNTER — Inpatient Hospital Stay (HOSPITAL_BASED_OUTPATIENT_CLINIC_OR_DEPARTMENT_OTHER): Payer: Medicare Other | Admitting: Oncology

## 2021-01-05 VITALS — BP 103/71 | HR 92 | Temp 97.6°F | Resp 18 | Wt 175.4 lb

## 2021-01-05 DIAGNOSIS — C221 Intrahepatic bile duct carcinoma: Secondary | ICD-10-CM

## 2021-01-05 DIAGNOSIS — Z5112 Encounter for antineoplastic immunotherapy: Secondary | ICD-10-CM | POA: Diagnosis not present

## 2021-01-05 LAB — CBC WITH DIFFERENTIAL (CANCER CENTER ONLY)
Abs Immature Granulocytes: 0.01 10*3/uL (ref 0.00–0.07)
Basophils Absolute: 0 10*3/uL (ref 0.0–0.1)
Basophils Relative: 1 %
Eosinophils Absolute: 0.2 10*3/uL (ref 0.0–0.5)
Eosinophils Relative: 4 %
HCT: 34.2 % — ABNORMAL LOW (ref 39.0–52.0)
Hemoglobin: 11.7 g/dL — ABNORMAL LOW (ref 13.0–17.0)
Immature Granulocytes: 0 %
Lymphocytes Relative: 15 %
Lymphs Abs: 0.8 10*3/uL (ref 0.7–4.0)
MCH: 30.9 pg (ref 26.0–34.0)
MCHC: 34.2 g/dL (ref 30.0–36.0)
MCV: 90.2 fL (ref 80.0–100.0)
Monocytes Absolute: 0.5 10*3/uL (ref 0.1–1.0)
Monocytes Relative: 10 %
Neutro Abs: 3.9 10*3/uL (ref 1.7–7.7)
Neutrophils Relative %: 70 %
Platelet Count: 169 10*3/uL (ref 150–400)
RBC: 3.79 MIL/uL — ABNORMAL LOW (ref 4.22–5.81)
RDW: 12.2 % (ref 11.5–15.5)
WBC Count: 5.5 10*3/uL (ref 4.0–10.5)
nRBC: 0 % (ref 0.0–0.2)

## 2021-01-05 LAB — CMP (CANCER CENTER ONLY)
ALT: 27 U/L (ref 0–44)
AST: 20 U/L (ref 15–41)
Albumin: 3.9 g/dL (ref 3.5–5.0)
Alkaline Phosphatase: 79 U/L (ref 38–126)
Anion gap: 10 (ref 5–15)
BUN: 29 mg/dL — ABNORMAL HIGH (ref 8–23)
CO2: 28 mmol/L (ref 22–32)
Calcium: 9.7 mg/dL (ref 8.9–10.3)
Chloride: 98 mmol/L (ref 98–111)
Creatinine: 1 mg/dL (ref 0.61–1.24)
GFR, Estimated: >60 mL/min (ref >60)
Glucose, Bld: 228 mg/dL — ABNORMAL HIGH (ref 70–99)
Potassium: 4.4 mmol/L (ref 3.5–5.1)
Sodium: 136 mmol/L (ref 135–145)
Total Bilirubin: 0.4 mg/dL (ref 0.3–1.2)
Total Protein: 7.1 g/dL (ref 6.5–8.1)

## 2021-01-05 LAB — MAGNESIUM: Magnesium: 1.6 mg/dL — ABNORMAL LOW (ref 1.7–2.4)

## 2021-01-05 MED ORDER — SODIUM CHLORIDE 0.9 % IV SOLN
1000.0000 mg/m2 | Freq: Once | INTRAVENOUS | Status: AC
  Administered 2021-01-05: 1938 mg via INTRAVENOUS
  Filled 2021-01-05: qty 50.97

## 2021-01-05 MED ORDER — SODIUM CHLORIDE 0.9% FLUSH
10.0000 mL | INTRAVENOUS | Status: DC | PRN
  Administered 2021-01-05: 10 mL
  Filled 2021-01-05: qty 10

## 2021-01-05 MED ORDER — HEPARIN SOD (PORK) LOCK FLUSH 100 UNIT/ML IV SOLN
500.0000 [IU] | Freq: Once | INTRAVENOUS | Status: AC | PRN
  Administered 2021-01-05: 500 [IU]
  Filled 2021-01-05: qty 5

## 2021-01-05 MED ORDER — MAGNESIUM SULFATE 2 GM/50ML IV SOLN
2.0000 g | Freq: Once | INTRAVENOUS | Status: AC
  Administered 2021-01-05: 2 g via INTRAVENOUS

## 2021-01-05 MED ORDER — DEXAMETHASONE SODIUM PHOSPHATE 10 MG/ML IJ SOLN
5.0000 mg | Freq: Once | INTRAMUSCULAR | Status: AC
Start: 2021-01-05 — End: 2021-01-05
  Administered 2021-01-05: 5 mg via INTRAVENOUS
  Filled 2021-01-05: qty 1

## 2021-01-05 MED ORDER — SODIUM CHLORIDE 0.9 % IV SOLN: 5.0000 mg | Freq: Once | INTRAVENOUS | Status: DC

## 2021-01-05 MED ORDER — SODIUM CHLORIDE 0.9 % IV SOLN
Freq: Once | INTRAVENOUS | Status: AC
  Filled 2021-01-05: qty 250

## 2021-01-05 MED ORDER — PALONOSETRON HCL INJECTION 0.25 MG/5ML
0.2500 mg | Freq: Once | INTRAVENOUS | Status: AC
  Administered 2021-01-05: 0.25 mg via INTRAVENOUS
  Filled 2021-01-05: qty 5

## 2021-01-05 MED ORDER — POTASSIUM CHLORIDE 2 MEQ/ML IV SOLN
Freq: Once | INTRAVENOUS | Status: AC
  Filled 2021-01-05: qty 1000

## 2021-01-05 MED ORDER — SODIUM CHLORIDE 0.9 % IV SOLN
25.0000 mg/m2 | Freq: Once | INTRAVENOUS | Status: AC
  Administered 2021-01-05: 49 mg via INTRAVENOUS
  Filled 2021-01-05: qty 49

## 2021-01-05 MED ORDER — SODIUM CHLORIDE 0.9 % IV SOLN
150.0000 mg | Freq: Once | INTRAVENOUS | Status: AC
  Administered 2021-01-05: 150 mg via INTRAVENOUS
  Filled 2021-01-05: qty 5

## 2021-01-05 NOTE — Progress Notes (Signed)
  [place] OFFICE PROGRESS NOTE   Diagnosis: Cholangiocarcinoma  INTERVAL HISTORY:   Erik Fletcher completed a first treatment with gemcitabine/cisplatin on 12/28/2020.  No nausea, fever, rash, or neuropathy symptoms.  He continues to have anorexia.  He took hydrocodone once for pain in the right abdomen.  He complains of "dizziness "when he goes from sitting to standing.  No syncope or fall.  He feels that his ears are "stopped".  Objective:  Vital signs in last 24 hours:  Blood pressure 103/71, pulse 92, temperature 97.6 F (36.4 C), temperature source Temporal, resp. rate 18, weight 175 lb 6.4 oz (79.6 kg), SpO2 98 %.    HEENT: No thrush or ulcers, the external canals and tympanic membranes appear clear Resp: Lungs clear bilaterally Cardio: Regular rate and rhythm GI: The liver edge is palpable in the right upper abdomen Vascular: No leg edema   Portacath/PICC-without erythema  Lab Results:  Lab Results  Component Value Date   WBC 5.5 01/05/2021   HGB 11.7 (L) 01/05/2021   HCT 34.2 (L) 01/05/2021   MCV 90.2 01/05/2021   PLT 169 01/05/2021   NEUTROABS 3.9 01/05/2021    CMP  Lab Results  Component Value Date   NA 136 01/05/2021   K 4.4 01/05/2021   CL 98 01/05/2021   CO2 28 01/05/2021   GLUCOSE 228 (H) 01/05/2021   BUN 29 (H) 01/05/2021   CREATININE 1.00 01/05/2021   CALCIUM 9.7 01/05/2021   PROT 7.1 01/05/2021   ALBUMIN 3.9 01/05/2021   AST 20 01/05/2021   ALT 27 01/05/2021   ALKPHOS 79 01/05/2021   BILITOT 0.4 01/05/2021   GFRNONAA >60 01/05/2021   GFRAA >60 10/11/2019    Lab Results  Component Value Date   CEA 54.5 (H) 12/01/2020   MBT597 25,775 (H) 12/28/2020    Medications: I have reviewed the patient's current medications.   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  He will complete day 8 of cycle 1 today.  I encouraged him to increase his calorie intake and fluids as tolerated.  He will see Dr. Harrington Challenger to evaluate the ear congestion.   He will return for an office visit and cycle 2 chemotherapy on 01/19/2021.  Betsy Coder, MD  01/05/2021  9:18 AM

## 2021-01-05 NOTE — Patient Instructions (Addendum)
[place]   Discharge Instructions: Thank you for choosing [facility] to provide your oncology and hematology care.   If you have a lab appointment with the [facility], please go directly to the [facility] and check in at the registration area.   Wear comfortable clothing and clothing appropriate for easy access to any Portacath or PICC line.   We strive to give you quality time with your provider. You may need to reschedule your appointment if you arrive late (15 or more minutes).  Arriving late affects you and other patients whose appointments are after yours.  Also, if you miss three or more appointments without notifying the office, you may be dismissed from the clinic at the provider's discretion.      For prescription refill requests, have your pharmacy contact our office and allow 72 hours for refills to be completed.    Today you received the following chemotherapy and/or immunotherapy agents Gemcitabine (GEMZAR) & Cisplatin (PLATINOL).   To help prevent nausea and vomiting after your treatment, we encourage you to take your nausea medication as directed.  BELOW ARE SYMPTOMS THAT SHOULD BE REPORTED IMMEDIATELY: *FEVER GREATER THAN 100.4 F (38 C) OR HIGHER *CHILLS OR SWEATING *NAUSEA AND VOMITING THAT IS NOT CONTROLLED WITH YOUR NAUSEA MEDICATION *UNUSUAL SHORTNESS OF BREATH *UNUSUAL BRUISING OR BLEEDING *URINARY PROBLEMS (pain or burning when urinating, or frequent urination) *BOWEL PROBLEMS (unusual diarrhea, constipation, pain near the anus) TENDERNESS IN MOUTH AND THROAT WITH OR WITHOUT PRESENCE OF ULCERS (sore throat, sores in mouth, or a toothache) UNUSUAL RASH, SWELLING OR PAIN  UNUSUAL VAGINAL DISCHARGE OR ITCHING   Items with * indicate a potential emergency and should be followed up as soon as possible or go to the Emergency Department if any problems should occur.  Please show the CHEMOTHERAPY ALERT CARD or  IMMUNOTHERAPY ALERT CARD at check-in to the Emergency Department and triage nurse.  Should you have questions after your visit or need to cancel or reschedule your appointment, please contact [facility]  Dept: [phone]  and follow the prompts.  Office hours are 8:00 a.m. to 4:30 p.m. Monday - Friday. Please note that voicemails left after 4:00 p.m. may not be returned until the following business day.  We are closed weekends and major holidays. You have access to a nurse at all times for urgent questions. Please call the main number to the clinic Dept: [phone] and follow the prompts.   For any non-urgent questions, you may also contact your provider using MyChart. We now offer e-Visits for anyone 56 and older to request care online for non-urgent symptoms. For details visit [email or web address].   Also download the MyChart app! Go to the app store, search "MyChart", open the app, select [facility], and log in with your MyChart username and password.  Due to Covid, a mask is required upon entering the hospital/clinic. If you do not have a mask, one will be given to you upon arrival. For doctor visits, patients may have 1 support person aged 66 or older with them. For treatment visits, patients cannot have anyone with them due to current Covid guidelines and our immunocompromised population.   Gemcitabine injection What is this medication? GEMCITABINE (jem SYE ta been) is a chemotherapy drug. This medicine is used to treat many types of cancer like breast cancer, lung cancer, pancreatic cancer,and ovarian cancer. This medicine may be used for other purposes; ask your health care provider  orpharmacist if you have questions. COMMON BRAND NAME(S): Gemzar, Infugem What should I tell my care team before I take this medication? They need to know if you have any of these conditions: blood disorders infection kidney disease liver disease lung or breathing disease,  like asthma recent or ongoing radiation therapy an unusual or allergic reaction to gemcitabine, other chemotherapy, other medicines, foods, dyes, or preservatives pregnant or trying to get pregnant breast-feeding How should I use this medication? This drug is given as an infusion into a vein. It is administered in a hospitalor clinic by a specially trained health care professional. Talk to your pediatrician regarding the use of this medicine in children.Special care may be needed. Overdosage: If you think you have taken too much of this medicine contact apoison control center or emergency room at once. NOTE: This medicine is only for you. Do not share this medicine with others. What if I miss a dose? It is important not to miss your dose. Call your doctor or health careprofessional if you are unable to keep an appointment. What may interact with this medication? medicines to increase blood counts like filgrastim, pegfilgrastim, sargramostim some other chemotherapy drugs like cisplatin vaccines Talk to your doctor or health care professional before taking any of thesemedicines: acetaminophen aspirin ibuprofen ketoprofen naproxen This list may not describe all possible interactions. Give your health care provider a list of all the medicines, herbs, non-prescription drugs, or dietary supplements you use. Also tell them if you smoke, drink alcohol, or use illegaldrugs. Some items may interact with your medicine. What should I watch for while using this medication? Visit your doctor for checks on your progress. This drug may make you feel generally unwell. This is not uncommon, as chemotherapy can affect healthy cells as well as cancer cells. Report any side effects. Continue your course oftreatment even though you feel ill unless your doctor tells you to stop. In some cases, you may be given additional medicines to help with side effects.Follow all directions for their use. Call your doctor or  health care professional for advice if you get a fever, chills or sore throat, or other symptoms of a cold or flu. Do not treat yourself. This drug decreases your body's ability to fight infections. Try toavoid being around people who are sick. This medicine may increase your risk to bruise or bleed. Call your doctor orhealth care professional if you notice any unusual bleeding. Be careful brushing and flossing your teeth or using a toothpick because you may get an infection or bleed more easily. If you have any dental work done,tell your dentist you are receiving this medicine. Avoid taking products that contain aspirin, acetaminophen, ibuprofen, naproxen, or ketoprofen unless instructed by your doctor. These medicines may hide afever. Do not become pregnant while taking this medicine or for 6 months after stopping it. Women should inform their doctor if they wish to become pregnant or think they might be pregnant. Men should not father a child while taking this medicine and for 3 months after stopping it. There is a potential for serious side effects to an unborn child. Talk to your health care professional or pharmacist for more information. Do not breast-feed an infant while takingthis medicine or for at least 1 week after stopping it. Men should inform their doctors if they wish to father a child. This medicine may lower sperm counts. Talk with your doctor or health care professional ifyou are concerned about your fertility. What side effects may I notice  from receiving this medication? Side effects that you should report to your doctor or health care professionalas soon as possible: allergic reactions like skin rash, itching or hives, swelling of the face, lips, or tongue breathing problems pain, redness, or irritation at site where injected signs and symptoms of a dangerous change in heartbeat or heart rhythm like chest pain; dizziness; fast or irregular heartbeat; palpitations; feeling faint or  lightheaded, falls; breathing problems signs of decreased platelets or bleeding - bruising, pinpoint red spots on the skin, black, tarry stools, blood in the urine signs of decreased red blood cells - unusually weak or tired, feeling faint or lightheaded, falls signs of infection - fever or chills, cough, sore throat, pain or difficulty passing urine signs and symptoms of kidney injury like trouble passing urine or change in the amount of urine signs and symptoms of liver injury like dark yellow or brown urine; general ill feeling or flu-like symptoms; light-colored stools; loss of appetite; nausea; right upper belly pain; unusually weak or tired; yellowing of the eyes or skin swelling of ankles, feet, hands Side effects that usually do not require medical attention (report to yourdoctor or health care professional if they continue or are bothersome): constipation diarrhea hair loss loss of appetite nausea rash vomiting This list may not describe all possible side effects. Call your doctor for medical advice about side effects. You may report side effects to FDA at1-800-FDA-1088. Where should I keep my medication? This drug is given in a hospital or clinic and will not be stored at home. NOTE: This sheet is a summary. It may not cover all possible information. If you have questions about this medicine, talk to your doctor, pharmacist, orhealth care provider.  2022 Elsevier/Gold Standard (2017-09-03 18:06:11)  Cisplatin injection What is this medication? CISPLATIN (SIS pla tin) is a chemotherapy drug. It targets fast dividing cells, like cancer cells, and causes these cells to die. This medicine is used totreat many types of cancer like bladder, ovarian, and testicular cancers. This medicine may be used for other purposes; ask your health care provider orpharmacist if you have questions. COMMON BRAND NAME(S): Platinol, Platinol -AQ What should I tell my care team before I take this  medication? They need to know if you have any of these conditions: eye disease, vision problems hearing problems kidney disease low blood counts, like white cells, platelets, or red blood cells tingling of the fingers or toes, or other nerve disorder an unusual or allergic reaction to cisplatin, carboplatin, oxaliplatin, other medicines, foods, dyes, or preservatives pregnant or trying to get pregnant breast-feeding How should I use this medication? This drug is given as an infusion into a vein. It is administered in a hospitalor clinic by a specially trained health care professional. Talk to your pediatrician regarding the use of this medicine in children.Special care may be needed. Overdosage: If you think you have taken too much of this medicine contact apoison control center or emergency room at once. NOTE: This medicine is only for you. Do not share this medicine with others. What if I miss a dose? It is important not to miss a dose. Call your doctor or health careprofessional if you are unable to keep an appointment. What may interact with this medication? This medicine may interact with the following medications: foscarnet certain antibiotics like amikacin, gentamicin, neomycin, polymyxin B, streptomycin, tobramycin, vancomycin This list may not describe all possible interactions. Give your health care provider a list of all the medicines, herbs, non-prescription drugs,  or dietary supplements you use. Also tell them if you smoke, drink alcohol, or use illegaldrugs. Some items may interact with your medicine. What should I watch for while using this medication? Your condition will be monitored carefully while you are receiving this medicine. You will need important blood work done while you are taking thismedicine. This drug may make you feel generally unwell. This is not uncommon, as chemotherapy can affect healthy cells as well as cancer cells. Report any side effects. Continue your  course of treatment even though you feel ill unless yourdoctor tells you to stop. This medicine may increase your risk of getting an infection. Call your healthcare professional for advice if you get a fever, chills, or sore throat, or other symptoms of a cold or flu. Do not treat yourself. Try to avoid beingaround people who are sick. Avoid taking medicines that contain aspirin, acetaminophen, ibuprofen, naproxen, or ketoprofen unless instructed by your healthcare professional.These medicines may hide a fever. This medicine may increase your risk to bruise or bleed. Call your doctor orhealth care professional if you notice any unusual bleeding. Be careful brushing and flossing your teeth or using a toothpick because you may get an infection or bleed more easily. If you have any dental work done,tell your dentist you are receiving this medicine. Do not become pregnant while taking this medicine or for 14 months after stopping it. Women should inform their healthcare professional if they wish to become pregnant or think they might be pregnant. Men should not father a child while taking this medicine and for 11 months after stopping it. There is potential for serious side effects to an unborn child. Talk to your healthcareprofessional for more information. Do not breast-feed an infant while taking this medicine. This medicine has caused ovarian failure in some women. This medicine may make it more difficult to get pregnant. Talk to your healthcare professional if [place] concerned about your fertility. This medicine has caused decreased sperm counts in some men. This may make it more difficult to father a child. Talk to your healthcare professional if [place] concerned about your fertility. Drink fluids as directed while you are taking this medicine. This will helpprotect your kidneys. Call your doctor or health care professional if you get diarrhea. Do not treatyourself. What side effects may I notice from  receiving this medication? Side effects that you should report to your doctor or health care professionalas soon as possible: allergic reactions like skin rash, itching or hives, swelling of the face, lips, or tongue blurred vision changes in vision decreased hearing or ringing of the ears nausea, vomiting pain, redness, or irritation at site where injected pain, tingling, numbness in the hands or feet signs and symptoms of bleeding such as bloody or black, tarry stools; red or dark brown urine; spitting up blood or brown material that looks like coffee grounds; red spots on the skin; unusual bruising or bleeding from the eyes, gums, or nose signs and symptoms of infection like fever; chills; cough; sore throat; pain or trouble passing urine signs and symptoms of kidney injury like trouble passing urine or change in the amount of urine signs and symptoms of low red blood cells or anemia such as unusually weak or tired; feeling faint or lightheaded; falls; breathing problems Side effects that usually do not require medical attention (report to yourdoctor or health care professional if they continue or are bothersome): loss of appetite mouth sores muscle cramps This list may not describe all possible side effects.  Call your doctor for medical advice about side effects. You may report side effects to FDA at1-800-FDA-1088. Where should I keep my medication? This drug is given in a hospital or clinic and will not be stored at home. NOTE: This sheet is a summary. It may not cover all possible information. If you have questions about this medicine, talk to your doctor, pharmacist, orhealth care provider.  2022 Elsevier/Gold Standard (2018-06-05 15:59:17)

## 2021-01-05 NOTE — Progress Notes (Signed)
Per Dr Erik Fletcher it is ok to start premeds and Gemcitabine with 150 of urine output. Need 50 mls of urine to start Cisplatin

## 2021-01-06 ENCOUNTER — Inpatient Hospital Stay: Payer: Medicare Other

## 2021-01-08 ENCOUNTER — Encounter: Payer: Self-pay | Admitting: Nutrition

## 2021-01-08 NOTE — Progress Notes (Signed)
Scheduler called pt to reschedule nutrition appointment and he says he will give [place] a call back to schedule. Does not want to schedule at the moment. He has a busy schedule - needs to look over some stuff.

## 2021-01-10 ENCOUNTER — Ambulatory Visit: Payer: Medicare Other | Admitting: Oncology

## 2021-01-13 ENCOUNTER — Other Ambulatory Visit: Payer: Self-pay | Admitting: Oncology

## 2021-01-19 ENCOUNTER — Inpatient Hospital Stay: Payer: Medicare Other

## 2021-01-19 ENCOUNTER — Inpatient Hospital Stay (HOSPITAL_BASED_OUTPATIENT_CLINIC_OR_DEPARTMENT_OTHER): Payer: Medicare Other | Admitting: Oncology

## 2021-01-19 ENCOUNTER — Other Ambulatory Visit: Payer: Self-pay

## 2021-01-19 ENCOUNTER — Other Ambulatory Visit: Payer: Self-pay | Admitting: Oncology

## 2021-01-19 VITALS — BP 102/69 | HR 98 | Temp 98.1°F | Resp 19 | Ht 68.0 in | Wt 176.0 lb

## 2021-01-19 DIAGNOSIS — C221 Intrahepatic bile duct carcinoma: Secondary | ICD-10-CM | POA: Diagnosis not present

## 2021-01-19 DIAGNOSIS — Z5112 Encounter for antineoplastic immunotherapy: Secondary | ICD-10-CM | POA: Diagnosis not present

## 2021-01-19 LAB — CMP (CANCER CENTER ONLY)
ALT: 19 U/L (ref 0–44)
AST: 25 U/L (ref 15–41)
Albumin: 3.9 g/dL (ref 3.5–5.0)
Alkaline Phosphatase: 89 U/L (ref 38–126)
Anion gap: 12 (ref 5–15)
BUN: 37 mg/dL — ABNORMAL HIGH (ref 8–23)
CO2: 25 mmol/L (ref 22–32)
Calcium: 9.3 mg/dL (ref 8.9–10.3)
Chloride: 95 mmol/L — ABNORMAL LOW (ref 98–111)
Creatinine: 1.23 mg/dL (ref 0.61–1.24)
GFR, Estimated: >60 mL/min (ref >60)
Glucose, Bld: 392 mg/dL — ABNORMAL HIGH (ref 70–99)
Potassium: 4.7 mmol/L (ref 3.5–5.1)
Sodium: 132 mmol/L — ABNORMAL LOW (ref 135–145)
Total Bilirubin: 0.4 mg/dL (ref 0.3–1.2)
Total Protein: 7.3 g/dL (ref 6.5–8.1)

## 2021-01-19 LAB — CBC WITH DIFFERENTIAL (CANCER CENTER ONLY)
Abs Immature Granulocytes: 0.02 10*3/uL (ref 0.00–0.07)
Basophils Absolute: 0 10*3/uL (ref 0.0–0.1)
Basophils Relative: 1 %
Eosinophils Absolute: 0.1 10*3/uL (ref 0.0–0.5)
Eosinophils Relative: 2 %
HCT: 30.2 % — ABNORMAL LOW (ref 39.0–52.0)
Hemoglobin: 10.3 g/dL — ABNORMAL LOW (ref 13.0–17.0)
Immature Granulocytes: 0 %
Lymphocytes Relative: 12 %
Lymphs Abs: 0.7 10*3/uL (ref 0.7–4.0)
MCH: 31 pg (ref 26.0–34.0)
MCHC: 34.1 g/dL (ref 30.0–36.0)
MCV: 91 fL (ref 80.0–100.0)
Monocytes Absolute: 1.1 10*3/uL — ABNORMAL HIGH (ref 0.1–1.0)
Monocytes Relative: 21 %
Neutro Abs: 3.4 10*3/uL (ref 1.7–7.7)
Neutrophils Relative %: 64 %
Platelet Count: 483 10*3/uL — ABNORMAL HIGH (ref 150–400)
RBC: 3.32 MIL/uL — ABNORMAL LOW (ref 4.22–5.81)
RDW: 13.3 % (ref 11.5–15.5)
WBC Count: 5.3 10*3/uL (ref 4.0–10.5)
nRBC: 0 % (ref 0.0–0.2)

## 2021-01-19 LAB — MAGNESIUM: Magnesium: 1.8 mg/dL (ref 1.7–2.4)

## 2021-01-19 MED ORDER — PALONOSETRON HCL INJECTION 0.25 MG/5ML
0.2500 mg | Freq: Once | INTRAVENOUS | Status: AC
  Administered 2021-01-19: 0.25 mg via INTRAVENOUS
  Filled 2021-01-19: qty 5

## 2021-01-19 MED ORDER — SODIUM CHLORIDE 0.9 % IV SOLN
1000.0000 mg/m2 | Freq: Once | INTRAVENOUS | Status: AC
  Administered 2021-01-19: 1938 mg via INTRAVENOUS
  Filled 2021-01-19: qty 50.97

## 2021-01-19 MED ORDER — MAGNESIUM SULFATE 2 GM/50ML IV SOLN
2.0000 g | Freq: Once | INTRAVENOUS | Status: AC
  Administered 2021-01-19: 2 g via INTRAVENOUS
  Filled 2021-01-19: qty 50

## 2021-01-19 MED ORDER — SODIUM CHLORIDE 0.9 % IV SOLN
Freq: Once | INTRAVENOUS | Status: AC
  Filled 2021-01-19: qty 250

## 2021-01-19 MED ORDER — SODIUM CHLORIDE 0.9 % IV SOLN
Freq: Once | INTRAVENOUS | Status: AC
  Filled 2021-01-19: qty 1000

## 2021-01-19 MED ORDER — DEXAMETHASONE SODIUM PHOSPHATE 10 MG/ML IJ SOLN
5.0000 mg | Freq: Once | INTRAMUSCULAR | Status: AC
  Administered 2021-01-19: 5 mg via INTRAVENOUS
  Filled 2021-01-19: qty 1

## 2021-01-19 MED ORDER — SODIUM CHLORIDE 0.9 % IV SOLN
25.0000 mg/m2 | Freq: Once | INTRAVENOUS | Status: AC
  Administered 2021-01-19: 49 mg via INTRAVENOUS
  Filled 2021-01-19: qty 49

## 2021-01-19 MED ORDER — SODIUM CHLORIDE 0.9 % IV SOLN
1500.0000 mg | Freq: Once | INTRAVENOUS | Status: AC
  Administered 2021-01-19: 1500 mg via INTRAVENOUS
  Filled 2021-01-19: qty 30

## 2021-01-19 MED ORDER — SODIUM CHLORIDE 0.9 % IV SOLN
150.0000 mg | Freq: Once | INTRAVENOUS | Status: AC
  Administered 2021-01-19: 150 mg via INTRAVENOUS
  Filled 2021-01-19: qty 5

## 2021-01-19 MED ORDER — HEPARIN SOD (PORK) LOCK FLUSH 100 UNIT/ML IV SOLN
500.0000 [IU] | Freq: Once | INTRAVENOUS | Status: AC | PRN
  Administered 2021-01-19: 500 [IU]
  Filled 2021-01-19: qty 5

## 2021-01-19 MED ORDER — SODIUM CHLORIDE 0.9 % IV SOLN: 5.0000 mg | Freq: Once | INTRAVENOUS | Status: DC

## 2021-01-19 MED ORDER — SODIUM CHLORIDE 0.9% FLUSH
10.0000 mL | INTRAVENOUS | Status: DC | PRN
  Administered 2021-01-19: 10 mL
  Filled 2021-01-19: qty 10

## 2021-01-19 NOTE — Patient Instructions (Signed)
Erik Fletcher  Discharge Instructions: Thank you for choosing [facility] to provide your oncology and hematology care.   If you have a lab appointment with the [facility], please go directly to the [facility] and check in at the registration area.   Wear comfortable clothing and clothing appropriate for easy access to any Portacath or PICC line.   We strive to give you quality time with your provider. You may need to reschedule your appointment if you arrive late (15 or more minutes).  Arriving late affects you and other patients whose appointments are after yours.  Also, if you miss three or more appointments without notifying the office, you may be dismissed from the clinic at the provider's discretion.      For prescription refill requests, have your pharmacy contact our office and allow 72 hours for refills to be completed.    Today you received the following chemotherapy and/or immunotherapy agents imfinzi, gemcitabine, cisplatin   To help prevent nausea and vomiting after your treatment, we encourage you to take your nausea medication as directed.  BELOW ARE SYMPTOMS THAT SHOULD BE REPORTED IMMEDIATELY: *FEVER GREATER THAN 100.4 F (38 C) OR HIGHER *CHILLS OR SWEATING *NAUSEA AND VOMITING THAT IS NOT CONTROLLED WITH YOUR NAUSEA MEDICATION *UNUSUAL SHORTNESS OF BREATH *UNUSUAL BRUISING OR BLEEDING *URINARY PROBLEMS (pain or burning when urinating, or frequent urination) *BOWEL PROBLEMS (unusual diarrhea, constipation, pain near the anus) TENDERNESS IN MOUTH AND THROAT WITH OR WITHOUT PRESENCE OF ULCERS (sore throat, sores in mouth, or a toothache) UNUSUAL RASH, SWELLING OR PAIN  UNUSUAL VAGINAL DISCHARGE OR ITCHING   Items with * indicate a potential emergency and should be followed up as soon as possible or go to the Emergency Department if any problems should occur.  Please show the CHEMOTHERAPY ALERT CARD or IMMUNOTHERAPY ALERT CARD  at check-in to the Emergency Department and triage nurse.  Should you have questions after your visit or need to cancel or reschedule your appointment, please contact [facility]  Dept: [phone]  and follow the prompts.  Office hours are 8:00 a.m. to 4:30 p.m. Monday - Friday. Please note that voicemails left after 4:00 p.m. may not be returned until the following business day.  We are closed weekends and major holidays. You have access to a nurse at all times for urgent questions. Please call the main number to the clinic Dept: [phone] and follow the prompts.   For any non-urgent questions, you may also contact your provider using MyChart. We now offer e-Visits for anyone 51 and older to request care online for non-urgent symptoms. For details visit [email or web address].   Also download the MyChart app! Go to the app store, search "MyChart", open the app, select [facility], and log in with your MyChart username and password.  Due to Covid, a mask is required upon entering the hospital/clinic. If you do not have a mask, one will be given to you upon arrival. For doctor visits, patients may have 1 support person aged 51 or older with them. For treatment visits, patients cannot have anyone with them due to current Covid guidelines and our immunocompromised population.   Durvalumab injection What is this medication? DURVALUMAB (dur VAL ue mab) is a monoclonal antibody. It is used to treat lungcancer. This medicine may be used for other purposes; ask your health care provider orpharmacist if you have questions. COMMON BRAND NAME(S): IMFINZI What should I tell my care team  before I take this medication? They need to know if you have any of these conditions: autoimmune diseases like Crohn's disease, ulcerative colitis, or lupus have had or planning to have an allogeneic stem cell transplant (uses someone else's stem cells) history of organ transplant history of  radiation to the chest nervous system problems like myasthenia gravis or Guillain-Barre syndrome an unusual or allergic reaction to durvalumab, other medicines, foods, dyes, or preservatives pregnant or trying to get pregnant breast-feeding How should I use this medication? This medicine is for infusion into a vein. It is given by a health careprofessional in a hospital or clinic setting. A special MedGuide will be given to you before each treatment. Be sure to readthis information carefully each time. Talk to your pediatrician regarding the use of this medicine in children.Special care may be needed. Overdosage: If you think you have taken too much of this medicine contact apoison control center or emergency room at once. NOTE: This medicine is only for you. Do not share this medicine with others. What if I miss a dose? It is important not to miss your dose. Call your doctor or health careprofessional if you are unable to keep an appointment. What may interact with this medication? Interactions have not been studied. This list may not describe all possible interactions. Give your health care provider a list of all the medicines, herbs, non-prescription drugs, or dietary supplements you use. Also tell them if you smoke, drink alcohol, or use illegaldrugs. Some items may interact with your medicine. What should I watch for while using this medication? This drug may make you feel generally unwell. Continue your course of treatmenteven though you feel ill unless your doctor tells you to stop. You may need blood work done while you are taking this medicine. Do not become pregnant while taking this medicine or for 3 months after stopping it. Women should inform their doctor if they wish to become pregnant or think they might be pregnant. There is a potential for serious side effects to an unborn child. Talk to your health care professional or pharmacist for more information. Do not breast-feed an  infant while taking this medicine orfor 3 months after stopping it. What side effects may I notice from receiving this medication? Side effects that you should report to your doctor or health care professionalas soon as possible: allergic reactions like skin rash, itching or hives, swelling of the face, lips, or tongue black, tarry stools bloody or watery diarrhea breathing problems change in emotions or moods change in sex drive changes in vision chest pain or chest tightness chills confusion cough facial flushing fever headache signs and symptoms of high blood sugar such as dizziness; dry mouth; dry skin; fruity breath; nausea; stomach pain; increased hunger or thirst; increased urination signs and symptoms of liver injury like dark yellow or brown urine; general ill feeling or flu-like symptoms; light-colored stools; loss of appetite; nausea; right upper belly pain; unusually weak or tired; yellowing of the eyes or skin stomach pain trouble passing urine or change in the amount of urine weight gain or weight loss Side effects that usually do not require medical attention (report these toyour doctor or health care professional if they continue or are bothersome): bone pain constipation loss of appetite muscle pain nausea swelling of the ankles, feet, hands tiredness This list may not describe all possible side effects. Call your doctor for medical advice about side effects. You may report side effects to FDA at1-800-FDA-1088. Where should I  keep my medication? This drug is given in a hospital or clinic and will not be stored at home. NOTE: This sheet is a summary. It may not cover all possible information. If you have questions about this medicine, talk to your doctor, pharmacist, orhealth care provider.  2022 Elsevier/Gold Standard (2019-08-19 13:01:29)  Gemcitabine injection What is this medication? GEMCITABINE (jem SYE ta been) is a chemotherapy drug. This medicine is used  to treat many types of cancer like breast cancer, lung cancer, pancreatic cancer,and ovarian cancer. This medicine may be used for other purposes; ask your health care provider orpharmacist if you have questions. COMMON BRAND NAME(S): Gemzar, Infugem What should I tell my care team before I take this medication? They need to know if you have any of these conditions: blood disorders infection kidney disease liver disease lung or breathing disease, like asthma recent or ongoing radiation therapy an unusual or allergic reaction to gemcitabine, other chemotherapy, other medicines, foods, dyes, or preservatives pregnant or trying to get pregnant breast-feeding How should I use this medication? This drug is given as an infusion into a vein. It is administered in a hospitalor clinic by a specially trained health care professional. Talk to your pediatrician regarding the use of this medicine in children.Special care may be needed. Overdosage: If you think you have taken too much of this medicine contact apoison control center or emergency room at once. NOTE: This medicine is only for you. Do not share this medicine with others. What if I miss a dose? It is important not to miss your dose. Call your doctor or health careprofessional if you are unable to keep an appointment. What may interact with this medication? medicines to increase blood counts like filgrastim, pegfilgrastim, sargramostim some other chemotherapy drugs like cisplatin vaccines Talk to your doctor or health care professional before taking any of thesemedicines: acetaminophen aspirin ibuprofen ketoprofen naproxen This list may not describe all possible interactions. Give your health care provider a list of all the medicines, herbs, non-prescription drugs, or dietary supplements you use. Also tell them if you smoke, drink alcohol, or use illegaldrugs. Some items may interact with your medicine. What should I watch for while  using this medication? Visit your doctor for checks on your progress. This drug may make you feel generally unwell. This is not uncommon, as chemotherapy can affect healthy cells as well as cancer cells. Report any side effects. Continue your course oftreatment even though you feel ill unless your doctor tells you to stop. In some cases, you may be given additional medicines to help with side effects.Follow all directions for their use. Call your doctor or health care professional for advice if you get a fever, chills or sore throat, or other symptoms of a cold or flu. Do not treat yourself. This drug decreases your body's ability to fight infections. Try toavoid being around people who are sick. This medicine may increase your risk to bruise or bleed. Call your doctor orhealth care professional if you notice any unusual bleeding. Be careful brushing and flossing your teeth or using a toothpick because you may get an infection or bleed more easily. If you have any dental work done,tell your dentist you are receiving this medicine. Avoid taking products that contain aspirin, acetaminophen, ibuprofen, naproxen, or ketoprofen unless instructed by your doctor. These medicines may hide afever. Do not become pregnant while taking this medicine or for 6 months after stopping it. Women should inform their doctor if they wish to become pregnant  or think they might be pregnant. Men should not father a child while taking this medicine and for 3 months after stopping it. There is a potential for serious side effects to an unborn child. Talk to your health care professional or pharmacist for more information. Do not breast-feed an infant while takingthis medicine or for at least 1 week after stopping it. Men should inform their doctors if they wish to father a child. This medicine may lower sperm counts. Talk with your doctor or health care professional ifyou are concerned about your fertility. What side effects may I  notice from receiving this medication? Side effects that you should report to your doctor or health care professionalas soon as possible: allergic reactions like skin rash, itching or hives, swelling of the face, lips, or tongue breathing problems pain, redness, or irritation at site where injected signs and symptoms of a dangerous change in heartbeat or heart rhythm like chest pain; dizziness; fast or irregular heartbeat; palpitations; feeling faint or lightheaded, falls; breathing problems signs of decreased platelets or bleeding - bruising, pinpoint red spots on the skin, black, tarry stools, blood in the urine signs of decreased red blood cells - unusually weak or tired, feeling faint or lightheaded, falls signs of infection - fever or chills, cough, sore throat, pain or difficulty passing urine signs and symptoms of kidney injury like trouble passing urine or change in the amount of urine signs and symptoms of liver injury like dark yellow or brown urine; general ill feeling or flu-like symptoms; light-colored stools; loss of appetite; nausea; right upper belly pain; unusually weak or tired; yellowing of the eyes or skin swelling of ankles, feet, hands Side effects that usually do not require medical attention (report to yourdoctor or health care professional if they continue or are bothersome): constipation diarrhea hair loss loss of appetite nausea rash vomiting This list may not describe all possible side effects. Call your doctor for medical advice about side effects. You may report side effects to FDA at1-800-FDA-1088. Where should I keep my medication? This drug is given in a hospital or clinic and will not be stored at home. NOTE: This sheet is a summary. It may not cover all possible information. If you have questions about this medicine, talk to your doctor, pharmacist, orhealth care provider.  2022 Elsevier/Gold Standard (2017-09-03 18:06:11)  Cisplatin injection What is  this medication? CISPLATIN (SIS pla tin) is a chemotherapy drug. It targets fast dividing cells, like cancer cells, and causes these cells to die. This medicine is used totreat many types of cancer like bladder, ovarian, and testicular cancers. This medicine may be used for other purposes; ask your health care provider orpharmacist if you have questions. COMMON BRAND NAME(S): Platinol, Platinol -AQ What should I tell my care team before I take this medication? They need to know if you have any of these conditions: eye disease, vision problems hearing problems kidney disease low blood counts, like white cells, platelets, or red blood cells tingling of the fingers or toes, or other nerve disorder an unusual or allergic reaction to cisplatin, carboplatin, oxaliplatin, other medicines, foods, dyes, or preservatives pregnant or trying to get pregnant breast-feeding How should I use this medication? This drug is given as an infusion into a vein. It is administered in a hospitalor clinic by a specially trained health care professional. Talk to your pediatrician regarding the use of this medicine in children.Special care may be needed. Overdosage: If you think you have taken too much of  this medicine contact apoison control center or emergency room at once. NOTE: This medicine is only for you. Do not share this medicine with others. What if I miss a dose? It is important not to miss a dose. Call your doctor or health careprofessional if you are unable to keep an appointment. What may interact with this medication? This medicine may interact with the following medications: foscarnet certain antibiotics like amikacin, gentamicin, neomycin, polymyxin B, streptomycin, tobramycin, vancomycin This list may not describe all possible interactions. Give your health care provider a list of all the medicines, herbs, non-prescription drugs, or dietary supplements you use. Also tell them if you smoke, drink  alcohol, or use illegaldrugs. Some items may interact with your medicine. What should I watch for while using this medication? Your condition will be monitored carefully while you are receiving this medicine. You will need important blood work done while you are taking thismedicine. This drug may make you feel generally unwell. This is not uncommon, as chemotherapy can affect healthy cells as well as cancer cells. Report any side effects. Continue your course of treatment even though you feel ill unless yourdoctor tells you to stop. This medicine may increase your risk of getting an infection. Call your healthcare professional for advice if you get a fever, chills, or sore throat, or other symptoms of a cold or flu. Do not treat yourself. Try to avoid beingaround people who are sick. Avoid taking medicines that contain aspirin, acetaminophen, ibuprofen, naproxen, or ketoprofen unless instructed by your healthcare professional.These medicines may hide a fever. This medicine may increase your risk to bruise or bleed. Call your doctor orhealth care professional if you notice any unusual bleeding. Be careful brushing and flossing your teeth or using a toothpick because you may get an infection or bleed more easily. If you have any dental work done,tell your dentist you are receiving this medicine. Do not become pregnant while taking this medicine or for 14 months after stopping it. Women should inform their healthcare professional if they wish to become pregnant or think they might be pregnant. Men should not father a child while taking this medicine and for 11 months after stopping it. There is potential for serious side effects to an unborn child. Talk to your healthcareprofessional for more information. Do not breast-feed an infant while taking this medicine. This medicine has caused ovarian failure in some women. This medicine may make it more difficult to get pregnant. Talk to your healthcare professional  if [place] concerned about your fertility. This medicine has caused decreased sperm counts in some men. This may make it more difficult to father a child. Talk to your healthcare professional if [place] concerned about your fertility. Drink fluids as directed while you are taking this medicine. This will helpprotect your kidneys. Call your doctor or health care professional if you get diarrhea. Do not treatyourself. What side effects may I notice from receiving this medication? Side effects that you should report to your doctor or health care professionalas soon as possible: allergic reactions like skin rash, itching or hives, swelling of the face, lips, or tongue blurred vision changes in vision decreased hearing or ringing of the ears nausea, vomiting pain, redness, or irritation at site where injected pain, tingling, numbness in the hands or feet signs and symptoms of bleeding such as bloody or black, tarry stools; red or dark brown urine; spitting up blood or brown material that looks like coffee grounds; red spots on the skin; unusual bruising or  bleeding from the eyes, gums, or nose signs and symptoms of infection like fever; chills; cough; sore throat; pain or trouble passing urine signs and symptoms of kidney injury like trouble passing urine or change in the amount of urine signs and symptoms of low red blood cells or anemia such as unusually weak or tired; feeling faint or lightheaded; falls; breathing problems Side effects that usually do not require medical attention (report to yourdoctor or health care professional if they continue or are bothersome): loss of appetite mouth sores muscle cramps This list may not describe all possible side effects. Call your doctor for medical advice about side effects. You may report side effects to FDA at1-800-FDA-1088. Where should I keep my medication? This drug is given in a hospital or clinic and will not be stored at home. NOTE: This sheet is  a summary. It may not cover all possible information. If you have questions about this medicine, talk to your doctor, pharmacist, orhealth care provider.  2022 Elsevier/Gold Standard (2018-06-05 15:59:17)

## 2021-01-19 NOTE — Progress Notes (Signed)
  [facility] OFFICE PROGRESS NOTE   Diagnosis: Cholangiocarcinoma  INTERVAL HISTORY:   Erik Fletcher completed day 8 of cycle 1 chemotherapy 01/05/2021.  No nausea/vomiting, fever, rash, diarrhea, or neuropathy symptoms.  He continues to have malaise.  He has abdominal pain that is improved with position change.  He is sleeping in a recliner.  He takes hydrocodone infrequently.  He had a "tick bite "at the right upper inner thigh and is taking doxycycline.  The area of erythema has improved.  Objective:  Vital signs in last 24 hours:  Blood pressure 102/69, pulse 98, temperature 98.1 F (36.7 C), temperature source Oral, resp. rate 19, height $RemoveBe'5\' 8"'xxIeMxHky$  (1.727 m), weight 176 lb (79.8 kg), SpO2 98 %.    HEENT: No thrush or ulcers Resp: Lungs clear bilaterally Cardio: Regular rate and rhythm GI: No splenomegaly, the liver is palpable in the right upper abdomen with associated tenderness Vascular: No leg edema  Skin: Fading area of erythema at the upper right medial thigh  Portacath/PICC-without erythema  Lab Results:  Lab Results  Component Value Date   WBC 5.3 01/19/2021   HGB 10.3 (L) 01/19/2021   HCT 30.2 (L) 01/19/2021   MCV 91.0 01/19/2021   PLT 483 (H) 01/19/2021   NEUTROABS 3.4 01/19/2021    CMP  Lab Results  Component Value Date   NA 136 01/05/2021   K 4.4 01/05/2021   CL 98 01/05/2021   CO2 28 01/05/2021   GLUCOSE 228 (H) 01/05/2021   BUN 29 (H) 01/05/2021   CREATININE 1.00 01/05/2021   CALCIUM 9.7 01/05/2021   PROT 7.1 01/05/2021   ALBUMIN 3.9 01/05/2021   AST 20 01/05/2021   ALT 27 01/05/2021   ALKPHOS 79 01/05/2021   BILITOT 0.4 01/05/2021   GFRNONAA >60 01/05/2021   GFRAA >60 10/11/2019    Lab Results  Component Value Date   CEA 54.5 (H) 12/01/2020   VAP014 25,775 (H) 12/28/2020     Medications: I have reviewed the patient's current medications.   Assessment/Plan:  Intrahepatic cholangiocarcinoma, clinical stage IIIb  (T2N1M0) Abdominal ultrasound 11/24/2020-by lobar hepatic lesions including a large infiltrative right liver lesion and heterogenous mass superior to the pancreas head MRI abdomen 11/29/2020-numerous hypoenhancing by lobar liver lesions suggestive of cholangiocarcinoma, multiple abnormal portacaval and celiac axis/gastropathic ligament nodes consistent with metastatic disease Ultrasound-guided biopsy of a right liver lesion 12/11/2020-adenocarcinoma, immunohistochemical profile and morphology consistent with a pancreaticobiliary primary MSS, tumor mutation burden-1, no FGFR2 alteration Markedly elevated CA 19-9 CT 12/20/2020-numerous right and left liver lesions, numerous portacaval, celiac, and gastropathic ligament nodes, no evidence of metastatic disease in the chest Cycle 1 gemcitabine/cisplatin/Durvalumab 12/28/2020 Cycle 2 gemcitabine/cisplatin/Durvalumab 01/19/2021 Glaucoma Anorexia/weight loss secondary to #1 Pain secondary to #1 Port-A-Cath placement 12/27/2020    Disposition: Erik Fletcher appears stable.  He will complete cycle 2 of systemic therapy beginning today.  He will receive G-CSF with this cycle.  He will return for an office visit and day 8 chemotherapy in 1 week.  We will check the CA 19-9 when he returns for cycle 3 on 02/09/2021.  He will undergo a restaging evaluation after cycle 3.  Betsy Coder, MD  01/19/2021  9:16 AM

## 2021-01-26 ENCOUNTER — Inpatient Hospital Stay: Payer: Medicare Other | Attending: Oncology

## 2021-01-26 ENCOUNTER — Other Ambulatory Visit: Payer: Self-pay

## 2021-01-26 ENCOUNTER — Inpatient Hospital Stay: Payer: Medicare Other

## 2021-01-26 ENCOUNTER — Encounter: Payer: Self-pay | Admitting: Nurse Practitioner

## 2021-01-26 ENCOUNTER — Inpatient Hospital Stay (HOSPITAL_BASED_OUTPATIENT_CLINIC_OR_DEPARTMENT_OTHER): Payer: Medicare Other | Admitting: Nurse Practitioner

## 2021-01-26 VITALS — BP 100/79 | HR 56 | Temp 97.8°F | Resp 20 | Ht 68.0 in | Wt 173.4 lb

## 2021-01-26 DIAGNOSIS — E860 Dehydration: Secondary | ICD-10-CM | POA: Diagnosis not present

## 2021-01-26 DIAGNOSIS — Z5111 Encounter for antineoplastic chemotherapy: Secondary | ICD-10-CM | POA: Insufficient documentation

## 2021-01-26 DIAGNOSIS — Z5112 Encounter for antineoplastic immunotherapy: Secondary | ICD-10-CM | POA: Insufficient documentation

## 2021-01-26 DIAGNOSIS — Z79899 Other long term (current) drug therapy: Secondary | ICD-10-CM | POA: Insufficient documentation

## 2021-01-26 DIAGNOSIS — C221 Intrahepatic bile duct carcinoma: Secondary | ICD-10-CM | POA: Insufficient documentation

## 2021-01-26 DIAGNOSIS — C801 Malignant (primary) neoplasm, unspecified: Secondary | ICD-10-CM

## 2021-01-26 LAB — CMP (CANCER CENTER ONLY)
ALT: 21 U/L (ref 0–44)
AST: 20 U/L (ref 15–41)
Albumin: 3.9 g/dL (ref 3.5–5.0)
Alkaline Phosphatase: 102 U/L (ref 38–126)
Anion gap: 9 (ref 5–15)
BUN: 32 mg/dL — ABNORMAL HIGH (ref 8–23)
CO2: 28 mmol/L (ref 22–32)
Calcium: 9.7 mg/dL (ref 8.9–10.3)
Chloride: 99 mmol/L (ref 98–111)
Creatinine: 1.14 mg/dL (ref 0.61–1.24)
GFR, Estimated: >60 mL/min (ref >60)
Glucose, Bld: 204 mg/dL — ABNORMAL HIGH (ref 70–99)
Potassium: 5 mmol/L (ref 3.5–5.1)
Sodium: 136 mmol/L (ref 135–145)
Total Bilirubin: 0.3 mg/dL (ref 0.3–1.2)
Total Protein: 7.3 g/dL (ref 6.5–8.1)

## 2021-01-26 LAB — CBC WITH DIFFERENTIAL (CANCER CENTER ONLY)
Abs Immature Granulocytes: 0.1 10*3/uL — ABNORMAL HIGH (ref 0.00–0.07)
Basophils Absolute: 0.1 10*3/uL (ref 0.0–0.1)
Basophils Relative: 1 %
Eosinophils Absolute: 0.2 10*3/uL (ref 0.0–0.5)
Eosinophils Relative: 3 %
HCT: 30.8 % — ABNORMAL LOW (ref 39.0–52.0)
Hemoglobin: 10.8 g/dL — ABNORMAL LOW (ref 13.0–17.0)
Immature Granulocytes: 2 %
Lymphocytes Relative: 18 %
Lymphs Abs: 1 10*3/uL (ref 0.7–4.0)
MCH: 31.6 pg (ref 26.0–34.0)
MCHC: 35.1 g/dL (ref 30.0–36.0)
MCV: 90.1 fL (ref 80.0–100.0)
Monocytes Absolute: 0.8 10*3/uL (ref 0.1–1.0)
Monocytes Relative: 14 %
Neutro Abs: 3.6 10*3/uL (ref 1.7–7.7)
Neutrophils Relative %: 62 %
Platelet Count: 464 10*3/uL — ABNORMAL HIGH (ref 150–400)
RBC: 3.42 MIL/uL — ABNORMAL LOW (ref 4.22–5.81)
RDW: 13 % (ref 11.5–15.5)
WBC Count: 5.8 10*3/uL (ref 4.0–10.5)
nRBC: 0 % (ref 0.0–0.2)

## 2021-01-26 LAB — MAGNESIUM: Magnesium: 1.7 mg/dL (ref 1.7–2.4)

## 2021-01-26 MED ORDER — ALTEPLASE 2 MG IJ SOLR
2.0000 mg | Freq: Once | INTRAMUSCULAR | Status: AC
  Administered 2021-01-26: 2 mg
  Filled 2021-01-26: qty 2

## 2021-01-26 MED ORDER — DEXAMETHASONE SODIUM PHOSPHATE 10 MG/ML IJ SOLN
5.0000 mg | Freq: Once | INTRAMUSCULAR | Status: AC
  Administered 2021-01-26: 5 mg via INTRAVENOUS
  Filled 2021-01-26: qty 1

## 2021-01-26 MED ORDER — MAGNESIUM SULFATE 2 GM/50ML IV SOLN
2.0000 g | Freq: Once | INTRAVENOUS | Status: AC
  Administered 2021-01-26: 2 g via INTRAVENOUS
  Filled 2021-01-26: qty 50

## 2021-01-26 MED ORDER — SODIUM CHLORIDE 0.9 % IV SOLN: 5.0000 mg | Freq: Once | INTRAVENOUS | Status: DC

## 2021-01-26 MED ORDER — SODIUM CHLORIDE 0.9 % IV SOLN
150.0000 mg | Freq: Once | INTRAVENOUS | Status: AC
  Administered 2021-01-26: 150 mg via INTRAVENOUS
  Filled 2021-01-26: qty 5

## 2021-01-26 MED ORDER — SODIUM CHLORIDE 0.9 % IV SOLN
Freq: Once | INTRAVENOUS | Status: AC
  Filled 2021-01-26: qty 250

## 2021-01-26 MED ORDER — SODIUM CHLORIDE 0.9% FLUSH
10.0000 mL | INTRAVENOUS | Status: DC | PRN
  Administered 2021-01-26: 10 mL
  Filled 2021-01-26: qty 10

## 2021-01-26 MED ORDER — SODIUM CHLORIDE 0.9 % IV SOLN
Freq: Once | INTRAVENOUS | Status: AC
  Filled 2021-01-26: qty 1000

## 2021-01-26 MED ORDER — PALONOSETRON HCL INJECTION 0.25 MG/5ML
0.2500 mg | Freq: Once | INTRAVENOUS | Status: AC
  Administered 2021-01-26: 0.25 mg via INTRAVENOUS
  Filled 2021-01-26: qty 5

## 2021-01-26 MED ORDER — HEPARIN SOD (PORK) LOCK FLUSH 100 UNIT/ML IV SOLN
500.0000 [IU] | Freq: Once | INTRAVENOUS | Status: AC | PRN
  Administered 2021-01-26: 500 [IU]
  Filled 2021-01-26: qty 5

## 2021-01-26 MED ORDER — SODIUM CHLORIDE 0.9 % IV SOLN
25.0000 mg/m2 | Freq: Once | INTRAVENOUS | Status: AC
  Administered 2021-01-26: 49 mg via INTRAVENOUS
  Filled 2021-01-26: qty 49

## 2021-01-26 MED ORDER — SODIUM CHLORIDE 0.9 % IV SOLN
1000.0000 mg/m2 | Freq: Once | INTRAVENOUS | Status: AC
  Administered 2021-01-26: 1938 mg via INTRAVENOUS
  Filled 2021-01-26: qty 50.97

## 2021-01-26 NOTE — Progress Notes (Signed)
  [facility] OFFICE PROGRESS NOTE   Diagnosis: Cholangiocarcinoma  INTERVAL HISTORY:   Erik Fletcher returns as scheduled.  He began cycle 2 gemcitabine/cisplatin/durvalumab 01/19/2021.  He is seen today prior to proceeding with cycle 2 day 8.  In general he is tolerating chemotherapy well.  Main complaint is a decreased energy level following treatment.  No nausea or vomiting.  No mouth sores.  No diarrhea.  No rash.  No tinnitus or hearing loss.  No numbness or tingling in the hands or feet.  Appetite is marginal.  He is trying to eat multiple small meals a day.  He drinks an Ensure every morning.  Following increased activity last weekend he developed an area of soreness at the right chest/upper abdomen.  This has improved.  He denies shortness of breath.  No fever.  He has had a mild cough for a few weeks.  He wonders if this is related to sinus drainage.  Objective:  Vital signs in last 24 hours:  Blood pressure 100/79, pulse (!) 56, temperature 97.8 F (36.6 C), temperature source Oral, resp. rate 20, height _0  (1.727 m), weight 173 lb 6.4 oz (78.7 kg), SpO2 91 %.    HEENT: No thrush or ulcers. Resp: Lungs clear bilaterally. Cardio: Regular rate and rhythm. GI: Abdomen soft and nontender.  Palpable liver right upper abdomen, associated tenderness. Vascular: No leg edema. Skin: No rash. Port-A-Cath without erythema.   Lab Results:  Lab Results  Component Value Date   WBC 5.8 01/26/2021   HGB 10.8 (L) 01/26/2021   HCT 30.8 (L) 01/26/2021   MCV 90.1 01/26/2021   PLT 464 (H) 01/26/2021   NEUTROABS 3.6 01/26/2021    Imaging:  No results found.  Medications: I have reviewed the patient's current medications.  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 Viney appears stable.  Plan to proceed with cycle 2-day 8 gemcitabine/cisplatin today as scheduled.  He declines white cell growth factor support with this cycle.  We reviewed neutropenic precautions.  He understands to contact the office of fever, chills, other signs of infection.  Plan for CBC in 1 week.  We reviewed the CBC from today.  Counts adequate to proceed with treatment.  He will return for lab, follow-up, cycle 3 gemcitabine/cisplatin/durvalumab 02/09/2021.  He will contact the office in the interim as outlined above or with any other problems.    [name] ANP/GNP-BC   01/26/2021  8:58 AM

## 2021-01-26 NOTE — Patient Instructions (Signed)
[facility]  Discharge Instructions: Thank you for choosing [facility] to provide your oncology and hematology care.   If you have a lab appointment with the [facility], please go directly to the [facility] and check in at the registration area.   Wear comfortable clothing and clothing appropriate for easy access to any Portacath or PICC line.   We strive to give you quality time with your provider. You may need to reschedule your appointment if you arrive late (15 or more minutes).  Arriving late affects you and other patients whose appointments are after yours.  Also, if you miss three or more appointments without notifying the office, you may be dismissed from the clinic at the provider's discretion.      For prescription refill requests, have your pharmacy contact our office and allow 72 hours for refills to be completed.    Today you received the following chemotherapy and/or immunotherapy agents gemcitabine, cisplatin   To help prevent nausea and vomiting after your treatment, we encourage you to take your nausea medication as directed.  BELOW ARE SYMPTOMS THAT SHOULD BE REPORTED IMMEDIATELY: *FEVER GREATER THAN 100.4 F (38 C) OR HIGHER *CHILLS OR SWEATING *NAUSEA AND VOMITING THAT IS NOT CONTROLLED WITH YOUR NAUSEA MEDICATION *UNUSUAL SHORTNESS OF BREATH *UNUSUAL BRUISING OR BLEEDING *URINARY PROBLEMS (pain or burning when urinating, or frequent urination) *BOWEL PROBLEMS (unusual diarrhea, constipation, pain near the anus) TENDERNESS IN MOUTH AND THROAT WITH OR WITHOUT PRESENCE OF ULCERS (sore throat, sores in mouth, or a toothache) UNUSUAL RASH, SWELLING OR PAIN  UNUSUAL VAGINAL DISCHARGE OR ITCHING   Items with * indicate a potential emergency and should be followed up as soon as possible or go to the Emergency Department if any problems should occur.  Please show the CHEMOTHERAPY ALERT CARD or IMMUNOTHERAPY ALERT CARD at  check-in to the Emergency Department and triage nurse.  Should you have questions after your visit or need to cancel or reschedule your appointment, please contact [facility]  Dept: [phone]  and follow the prompts.  Office hours are 8:00 a.m. to 4:30 p.m. Monday - Friday. Please note that voicemails left after 4:00 p.m. may not be returned until the following business day.  We are closed weekends and major holidays. You have access to a nurse at all times for urgent questions. Please call the main number to the clinic Dept: [phone] and follow the prompts.   For any non-urgent questions, you may also contact your provider using MyChart. We now offer e-Visits for anyone 30 and older to request care online for non-urgent symptoms. For details visit [email or web address].   Also download the MyChart app! Go to the app store, search "MyChart", open the app, select [facility], and log in with your MyChart username and password.  Due to Covid, a mask is required upon entering the hospital/clinic. If you do not have a mask, one will be given to you upon arrival. For doctor visits, patients may have 1 support person aged 15 or older with them. For treatment visits, patients cannot have anyone with them due to current Covid guidelines and our immunocompromised population.   Gemcitabine injection What is this medication? GEMCITABINE (jem SYE ta been) is a chemotherapy drug. This medicine is used to treat many types of cancer like breast cancer, lung cancer, pancreatic cancer,and ovarian cancer. This medicine may be used for other purposes; ask your health care provider orpharmacist if you have  questions. COMMON BRAND NAME(S): Gemzar, Infugem What should I tell my care team before I take this medication? They need to know if you have any of these conditions: blood disorders infection kidney disease liver disease lung or breathing disease, like asthma recent or  ongoing radiation therapy an unusual or allergic reaction to gemcitabine, other chemotherapy, other medicines, foods, dyes, or preservatives pregnant or trying to get pregnant breast-feeding How should I use this medication? This drug is given as an infusion into a vein. It is administered in a hospitalor clinic by a specially trained health care professional. Talk to your pediatrician regarding the use of this medicine in children.Special care may be needed. Overdosage: If you think you have taken too much of this medicine contact apoison control center or emergency room at once. NOTE: This medicine is only for you. Do not share this medicine with others. What if I miss a dose? It is important not to miss your dose. Call your doctor or health careprofessional if you are unable to keep an appointment. What may interact with this medication? medicines to increase blood counts like filgrastim, pegfilgrastim, sargramostim some other chemotherapy drugs like cisplatin vaccines Talk to your doctor or health care professional before taking any of thesemedicines: acetaminophen aspirin ibuprofen ketoprofen naproxen This list may not describe all possible interactions. Give your health care provider a list of all the medicines, herbs, non-prescription drugs, or dietary supplements you use. Also tell them if you smoke, drink alcohol, or use illegaldrugs. Some items may interact with your medicine. What should I watch for while using this medication? Visit your doctor for checks on your progress. This drug may make you feel generally unwell. This is not uncommon, as chemotherapy can affect healthy cells as well as cancer cells. Report any side effects. Continue your course oftreatment even though you feel ill unless your doctor tells you to stop. In some cases, you may be given additional medicines to help with side effects.Follow all directions for their use. Call your doctor or health care  professional for advice if you get a fever, chills or sore throat, or other symptoms of a cold or flu. Do not treat yourself. This drug decreases your body's ability to fight infections. Try toavoid being around people who are sick. This medicine may increase your risk to bruise or bleed. Call your doctor orhealth care professional if you notice any unusual bleeding. Be careful brushing and flossing your teeth or using a toothpick because you may get an infection or bleed more easily. If you have any dental work done,tell your dentist you are receiving this medicine. Avoid taking products that contain aspirin, acetaminophen, ibuprofen, naproxen, or ketoprofen unless instructed by your doctor. These medicines may hide afever. Do not become pregnant while taking this medicine or for 6 months after stopping it. Women should inform their doctor if they wish to become pregnant or think they might be pregnant. Men should not father a child while taking this medicine and for 3 months after stopping it. There is a potential for serious side effects to an unborn child. Talk to your health care professional or pharmacist for more information. Do not breast-feed an infant while takingthis medicine or for at least 1 week after stopping it. Men should inform their doctors if they wish to father a child. This medicine may lower sperm counts. Talk with your doctor or health care professional ifyou are concerned about your fertility. What side effects may I notice from receiving this medication?  Side effects that you should report to your doctor or health care professionalas soon as possible: allergic reactions like skin rash, itching or hives, swelling of the face, lips, or tongue breathing problems pain, redness, or irritation at site where injected signs and symptoms of a dangerous change in heartbeat or heart rhythm like chest pain; dizziness; fast or irregular heartbeat; palpitations; feeling faint or lightheaded,  falls; breathing problems signs of decreased platelets or bleeding - bruising, pinpoint red spots on the skin, black, tarry stools, blood in the urine signs of decreased red blood cells - unusually weak or tired, feeling faint or lightheaded, falls signs of infection - fever or chills, cough, sore throat, pain or difficulty passing urine signs and symptoms of kidney injury like trouble passing urine or change in the amount of urine signs and symptoms of liver injury like dark yellow or brown urine; general ill feeling or flu-like symptoms; light-colored stools; loss of appetite; nausea; right upper belly pain; unusually weak or tired; yellowing of the eyes or skin swelling of ankles, feet, hands Side effects that usually do not require medical attention (report to yourdoctor or health care professional if they continue or are bothersome): constipation diarrhea hair loss loss of appetite nausea rash vomiting This list may not describe all possible side effects. Call your doctor for medical advice about side effects. You may report side effects to FDA at1-800-FDA-1088. Where should I keep my medication? This drug is given in a hospital or clinic and will not be stored at home. NOTE: This sheet is a summary. It may not cover all possible information. If you have questions about this medicine, talk to your doctor, pharmacist, orhealth care provider.  2022 Elsevier/Gold Standard (2017-09-03 18:06:11)  Cisplatin injection What is this medication? CISPLATIN (SIS pla tin) is a chemotherapy drug. It targets fast dividing cells, like cancer cells, and causes these cells to die. This medicine is used totreat many types of cancer like bladder, ovarian, and testicular cancers. This medicine may be used for other purposes; ask your health care provider orpharmacist if you have questions. COMMON BRAND NAME(S): Platinol, Platinol -AQ What should I tell my care team before I take this medication? They  need to know if you have any of these conditions: eye disease, vision problems hearing problems kidney disease low blood counts, like white cells, platelets, or red blood cells tingling of the fingers or toes, or other nerve disorder an unusual or allergic reaction to cisplatin, carboplatin, oxaliplatin, other medicines, foods, dyes, or preservatives pregnant or trying to get pregnant breast-feeding How should I use this medication? This drug is given as an infusion into a vein. It is administered in a hospitalor clinic by a specially trained health care professional. Talk to your pediatrician regarding the use of this medicine in children.Special care may be needed. Overdosage: If you think you have taken too much of this medicine contact apoison control center or emergency room at once. NOTE: This medicine is only for you. Do not share this medicine with others. What if I miss a dose? It is important not to miss a dose. Call your doctor or health careprofessional if you are unable to keep an appointment. What may interact with this medication? This medicine may interact with the following medications: foscarnet certain antibiotics like amikacin, gentamicin, neomycin, polymyxin B, streptomycin, tobramycin, vancomycin This list may not describe all possible interactions. Give your health care provider a list of all the medicines, herbs, non-prescription drugs, or dietary supplements you  use. Also tell them if you smoke, drink alcohol, or use illegaldrugs. Some items may interact with your medicine. What should I watch for while using this medication? Your condition will be monitored carefully while you are receiving this medicine. You will need important blood work done while you are taking thismedicine. This drug may make you feel generally unwell. This is not uncommon, as chemotherapy can affect healthy cells as well as cancer cells. Report any side effects. Continue your course of treatment  even though you feel ill unless yourdoctor tells you to stop. This medicine may increase your risk of getting an infection. Call your healthcare professional for advice if you get a fever, chills, or sore throat, or other symptoms of a cold or flu. Do not treat yourself. Try to avoid beingaround people who are sick. Avoid taking medicines that contain aspirin, acetaminophen, ibuprofen, naproxen, or ketoprofen unless instructed by your healthcare professional.These medicines may hide a fever. This medicine may increase your risk to bruise or bleed. Call your doctor orhealth care professional if you notice any unusual bleeding. Be careful brushing and flossing your teeth or using a toothpick because you may get an infection or bleed more easily. If you have any dental work done,tell your dentist you are receiving this medicine. Do not become pregnant while taking this medicine or for 14 months after stopping it. Women should inform their healthcare professional if they wish to become pregnant or think they might be pregnant. Men should not father a child while taking this medicine and for 11 months after stopping it. There is potential for serious side effects to an unborn child. Talk to your healthcareprofessional for more information. Do not breast-feed an infant while taking this medicine. This medicine has caused ovarian failure in some women. This medicine may make it more difficult to get pregnant. Talk to your healthcare professional if [place] concerned about your fertility. This medicine has caused decreased sperm counts in some men. This may make it more difficult to father a child. Talk to your healthcare professional if [place] concerned about your fertility. Drink fluids as directed while you are taking this medicine. This will helpprotect your kidneys. Call your doctor or health care professional if you get diarrhea. Do not treatyourself. What side effects may I notice from receiving this  medication? Side effects that you should report to your doctor or health care professionalas soon as possible: allergic reactions like skin rash, itching or hives, swelling of the face, lips, or tongue blurred vision changes in vision decreased hearing or ringing of the ears nausea, vomiting pain, redness, or irritation at site where injected pain, tingling, numbness in the hands or feet signs and symptoms of bleeding such as bloody or black, tarry stools; red or dark brown urine; spitting up blood or brown material that looks like coffee grounds; red spots on the skin; unusual bruising or bleeding from the eyes, gums, or nose signs and symptoms of infection like fever; chills; cough; sore throat; pain or trouble passing urine signs and symptoms of kidney injury like trouble passing urine or change in the amount of urine signs and symptoms of low red blood cells or anemia such as unusually weak or tired; feeling faint or lightheaded; falls; breathing problems Side effects that usually do not require medical attention (report to yourdoctor or health care professional if they continue or are bothersome): loss of appetite mouth sores muscle cramps This list may not describe all possible side effects. Call your doctor for  medical advice about side effects. You may report side effects to FDA at1-800-FDA-1088. Where should I keep my medication? This drug is given in a hospital or clinic and will not be stored at home. NOTE: This sheet is a summary. It may not cover all possible information. If you have questions about this medicine, talk to your doctor, pharmacist, orhealth care provider.  2022 Elsevier/Gold Standard (2018-06-05 15:59:17)

## 2021-01-27 ENCOUNTER — Ambulatory Visit: Payer: Medicare Other

## 2021-02-02 ENCOUNTER — Inpatient Hospital Stay: Payer: Medicare Other

## 2021-02-02 ENCOUNTER — Other Ambulatory Visit: Payer: Self-pay

## 2021-02-02 VITALS — BP 99/66 | HR 97 | Temp 98.1°F | Resp 18

## 2021-02-02 DIAGNOSIS — Z95828 Presence of other vascular implants and grafts: Secondary | ICD-10-CM

## 2021-02-02 DIAGNOSIS — C221 Intrahepatic bile duct carcinoma: Secondary | ICD-10-CM

## 2021-02-02 DIAGNOSIS — Z5112 Encounter for antineoplastic immunotherapy: Secondary | ICD-10-CM | POA: Diagnosis not present

## 2021-02-02 LAB — CBC WITH DIFFERENTIAL (CANCER CENTER ONLY)
Abs Immature Granulocytes: 0.03 K/uL (ref 0.00–0.07)
Basophils Absolute: 0.1 K/uL (ref 0.0–0.1)
Basophils Relative: 1 %
Eosinophils Absolute: 0.1 K/uL (ref 0.0–0.5)
Eosinophils Relative: 1 %
HCT: 28.1 % — ABNORMAL LOW (ref 39.0–52.0)
Hemoglobin: 9.6 g/dL — ABNORMAL LOW (ref 13.0–17.0)
Immature Granulocytes: 1 %
Lymphocytes Relative: 16 %
Lymphs Abs: 0.9 K/uL (ref 0.7–4.0)
MCH: 30.9 pg (ref 26.0–34.0)
MCHC: 34.2 g/dL (ref 30.0–36.0)
MCV: 90.4 fL (ref 80.0–100.0)
Monocytes Absolute: 0.4 K/uL (ref 0.1–1.0)
Monocytes Relative: 7 %
Neutro Abs: 4.2 K/uL (ref 1.7–7.7)
Neutrophils Relative %: 74 %
Platelet Count: 139 K/uL — ABNORMAL LOW (ref 150–400)
RBC: 3.11 MIL/uL — ABNORMAL LOW (ref 4.22–5.81)
RDW: 13.3 % (ref 11.5–15.5)
WBC Count: 5.7 K/uL (ref 4.0–10.5)
nRBC: 0 % (ref 0.0–0.2)

## 2021-02-02 MED ORDER — SODIUM CHLORIDE 0.9% FLUSH
10.0000 mL | Freq: Once | INTRAVENOUS | Status: AC
Start: 2021-02-02 — End: 2021-02-02
  Administered 2021-02-02: 10 mL via INTRAVENOUS
  Filled 2021-02-02: qty 10

## 2021-02-02 MED ORDER — HEPARIN SOD (PORK) LOCK FLUSH 100 UNIT/ML IV SOLN
500.0000 [IU] | Freq: Once | INTRAVENOUS | Status: AC
  Administered 2021-02-02: 500 [IU] via INTRAVENOUS
  Filled 2021-02-02: qty 5

## 2021-02-02 NOTE — Patient Instructions (Signed)
Implanted Port Home Guide An implanted port is a device that is placed under the skin. It is usually placed in the chest. The device can be used to give IV medicine, to take blood, or for dialysis. You may have an implanted port if: You need IV medicine that would be irritating to the small veins in your hands or arms. You need IV medicines, such as antibiotics, for a long period of time. You need IV nutrition for a long period of time. You need dialysis. When you have a port, your health care provider can choose to use the port instead of veins in your arms for these procedures. You may have fewer limitations when using a port than you would if you used other types of long-term IVs, and you will likely be able to return to normal activities afteryour incision heals. An implanted port has two main parts: Reservoir. The reservoir is the part where a needle is inserted to give medicines or draw blood. The reservoir is round. After it is placed, it appears as a small, raised area under your skin. Catheter. The catheter is a thin, flexible tube that connects the reservoir to a vein. Medicine that is inserted into the reservoir goes into the catheter and then into the vein. How is my port accessed? To access your port: A numbing cream may be placed on the skin over the port site. Your health care provider will put on a mask and sterile gloves. The skin over your port will be cleaned carefully with a germ-killing soap and allowed to dry. Your health care provider will gently pinch the port and insert a needle into it. Your health care provider will check for a blood return to make sure the port is in the vein and is not clogged. If your port needs to remain accessed to get medicine continuously (constant infusion), your health care provider will place a clear bandage (dressing) over the needle site. The dressing and needle will need to be changed every week, or as told by your health care provider. What  is flushing? Flushing helps keep the port from getting clogged. Follow instructions from your health care provider about how and when to flush the port. Ports are usually flushed with saline solution or a medicine called heparin. The need for flushing will depend on how the port is used: If the port is only used from time to time to give medicines or draw blood, the port may need to be flushed: Before and after medicines have been given. Before and after blood has been drawn. As part of routine maintenance. Flushing may be recommended every 4-6 weeks. If a constant infusion is running, the port may not need to be flushed. Throw away any syringes in a disposal container that is meant for sharp items (sharps container). You can buy a sharps container from a pharmacy, or you can make one by using an empty hard plastic bottle with a cover. How long will my port stay implanted? The port can stay in for as long as your health care provider thinks it is needed. When it is time for the port to come out, a surgery will be done to remove it. The surgery will be similar to the procedure that was done to putthe port in. Follow these instructions at home:  Flush your port as told by your health care provider. If you need an infusion over several days, follow instructions from your health care provider about how to take   care of your port site. Make sure you: Wash your hands with soap and water before you change your dressing. If soap and water are not available, use alcohol-based hand sanitizer. Change your dressing as told by your health care provider. Place any used dressings or infusion bags into a plastic bag. Throw that bag in the trash. Keep the dressing that covers the needle clean and dry. Do not get it wet. Do not use scissors or sharp objects near the tube. Keep the tube clamped, unless it is being used. Check your port site every day for signs of infection. Check for: Redness, swelling, or  pain. Fluid or blood. Pus or a bad smell. Protect the skin around the port site. Avoid wearing bra straps that rub or irritate the site. Protect the skin around your port from seat belts. Place a soft pad over your chest if needed. Bathe or shower as told by your health care provider. The site may get wet as long as you are not actively receiving an infusion. Return to your normal activities as told by your health care provider. Ask your health care provider what activities are safe for you. Carry a medical alert card or wear a medical alert bracelet at all times. This will let health care providers know that you have an implanted port in case of an emergency. Get help right away if: You have redness, swelling, or pain at the port site. You have fluid or blood coming from your port site. You have pus or a bad smell coming from the port site. You have a fever. Summary Implanted ports are usually placed in the chest for long-term IV access. Follow instructions from your health care provider about flushing the port and changing bandages (dressings). Take care of the area around your port by avoiding clothing that puts pressure on the area, and by watching for signs of infection. Protect the skin around your port from seat belts. Place a soft pad over your chest if needed. Get help right away if you have a fever or you have redness, swelling, pain, drainage, or a bad smell at the port site. This information is not intended to replace advice given to you by your health care provider. Make sure you discuss any questions you have with your healthcare provider. Document Revised: 10/25/2019 Document Reviewed: 10/25/2019 Elsevier Patient Education  2022 [employer]  

## 2021-02-04 ENCOUNTER — Other Ambulatory Visit: Payer: Self-pay | Admitting: Oncology

## 2021-02-09 ENCOUNTER — Inpatient Hospital Stay: Payer: Medicare Other

## 2021-02-09 ENCOUNTER — Other Ambulatory Visit: Payer: Self-pay

## 2021-02-09 ENCOUNTER — Other Ambulatory Visit: Payer: Self-pay | Admitting: Oncology

## 2021-02-09 ENCOUNTER — Inpatient Hospital Stay (HOSPITAL_BASED_OUTPATIENT_CLINIC_OR_DEPARTMENT_OTHER): Payer: Medicare Other | Admitting: Oncology

## 2021-02-09 VITALS — BP 100/50 | HR 97 | Temp 97.8°F | Resp 20 | Ht 68.0 in | Wt 173.8 lb

## 2021-02-09 DIAGNOSIS — C221 Intrahepatic bile duct carcinoma: Secondary | ICD-10-CM

## 2021-02-09 DIAGNOSIS — Z5112 Encounter for antineoplastic immunotherapy: Secondary | ICD-10-CM | POA: Diagnosis not present

## 2021-02-09 LAB — CBC WITH DIFFERENTIAL (CANCER CENTER ONLY)
Abs Immature Granulocytes: 0.03 10*3/uL (ref 0.00–0.07)
Basophils Absolute: 0 10*3/uL (ref 0.0–0.1)
Basophils Relative: 1 %
Eosinophils Absolute: 0.2 10*3/uL (ref 0.0–0.5)
Eosinophils Relative: 3 %
HCT: 28.3 % — ABNORMAL LOW (ref 39.0–52.0)
Hemoglobin: 9.7 g/dL — ABNORMAL LOW (ref 13.0–17.0)
Immature Granulocytes: 0 %
Lymphocytes Relative: 11 %
Lymphs Abs: 0.9 10*3/uL (ref 0.7–4.0)
MCH: 31.8 pg (ref 26.0–34.0)
MCHC: 34.3 g/dL (ref 30.0–36.0)
MCV: 92.8 fL (ref 80.0–100.0)
Monocytes Absolute: 1.1 10*3/uL — ABNORMAL HIGH (ref 0.1–1.0)
Monocytes Relative: 15 %
Neutro Abs: 5.3 10*3/uL (ref 1.7–7.7)
Neutrophils Relative %: 70 %
Platelet Count: 315 10*3/uL (ref 150–400)
RBC: 3.05 MIL/uL — ABNORMAL LOW (ref 4.22–5.81)
RDW: 16 % — ABNORMAL HIGH (ref 11.5–15.5)
WBC Count: 7.5 10*3/uL (ref 4.0–10.5)
nRBC: 0 % (ref 0.0–0.2)

## 2021-02-09 LAB — CMP (CANCER CENTER ONLY)
ALT: 23 U/L (ref 0–44)
AST: 23 U/L (ref 15–41)
Albumin: 3.7 g/dL (ref 3.5–5.0)
Alkaline Phosphatase: 103 U/L (ref 38–126)
Anion gap: 11 (ref 5–15)
BUN: 41 mg/dL — ABNORMAL HIGH (ref 8–23)
CO2: 26 mmol/L (ref 22–32)
Calcium: 9.4 mg/dL (ref 8.9–10.3)
Chloride: 97 mmol/L — ABNORMAL LOW (ref 98–111)
Creatinine: 1.05 mg/dL (ref 0.61–1.24)
GFR, Estimated: >60 mL/min (ref >60)
Glucose, Bld: 332 mg/dL — ABNORMAL HIGH (ref 70–99)
Potassium: 4.5 mmol/L (ref 3.5–5.1)
Sodium: 134 mmol/L — ABNORMAL LOW (ref 135–145)
Total Bilirubin: 0.3 mg/dL (ref 0.3–1.2)
Total Protein: 6.8 g/dL (ref 6.5–8.1)

## 2021-02-09 LAB — TSH: TSH: 6.755 u[IU]/mL — ABNORMAL HIGH (ref 0.350–4.500)

## 2021-02-09 LAB — MAGNESIUM: Magnesium: 1.7 mg/dL (ref 1.7–2.4)

## 2021-02-09 MED ORDER — HEPARIN SOD (PORK) LOCK FLUSH 100 UNIT/ML IV SOLN
500.0000 [IU] | Freq: Once | INTRAVENOUS | Status: AC | PRN
  Administered 2021-02-09: 500 [IU]

## 2021-02-09 MED ORDER — PALONOSETRON HCL INJECTION 0.25 MG/5ML
0.2500 mg | Freq: Once | INTRAVENOUS | Status: AC
  Administered 2021-02-09: 0.25 mg via INTRAVENOUS
  Filled 2021-02-09: qty 5

## 2021-02-09 MED ORDER — SODIUM CHLORIDE 0.9 % IV SOLN
Freq: Once | INTRAVENOUS | Status: AC
  Filled 2021-02-09: qty 1000

## 2021-02-09 MED ORDER — SODIUM CHLORIDE 0.9% FLUSH
10.0000 mL | INTRAVENOUS | Status: DC | PRN
  Administered 2021-02-09: 10 mL

## 2021-02-09 MED ORDER — MAGNESIUM SULFATE 2 GM/50ML IV SOLN
2.0000 g | Freq: Once | INTRAVENOUS | Status: AC
  Administered 2021-02-09: 2 g via INTRAVENOUS
  Filled 2021-02-09: qty 50

## 2021-02-09 MED ORDER — DEXAMETHASONE SODIUM PHOSPHATE 10 MG/ML IJ SOLN
5.0000 mg | Freq: Once | INTRAMUSCULAR | Status: AC
  Administered 2021-02-09: 5 mg via INTRAVENOUS
  Filled 2021-02-09: qty 1

## 2021-02-09 MED ORDER — SODIUM CHLORIDE 0.9 % IV SOLN
1000.0000 mg/m2 | Freq: Once | INTRAVENOUS | Status: AC
  Administered 2021-02-09: 1938 mg via INTRAVENOUS
  Filled 2021-02-09: qty 50.97

## 2021-02-09 MED ORDER — SODIUM CHLORIDE 0.9 % IV SOLN
25.0000 mg/m2 | Freq: Once | INTRAVENOUS | Status: AC
  Administered 2021-02-09: 49 mg via INTRAVENOUS
  Filled 2021-02-09: qty 49

## 2021-02-09 MED ORDER — SODIUM CHLORIDE 0.9 % IV SOLN
150.0000 mg | Freq: Once | INTRAVENOUS | Status: AC
  Administered 2021-02-09: 150 mg via INTRAVENOUS
  Filled 2021-02-09: qty 5

## 2021-02-09 MED ORDER — SODIUM CHLORIDE 0.9 % IV SOLN: Freq: Once | INTRAVENOUS | Status: AC

## 2021-02-09 MED ORDER — SODIUM CHLORIDE 0.9 % IV SOLN
1500.0000 mg | Freq: Once | INTRAVENOUS | Status: AC
  Administered 2021-02-09: 1500 mg via INTRAVENOUS
  Filled 2021-02-09: qty 30

## 2021-02-09 NOTE — Progress Notes (Signed)
Urine output 211m received at 1020.

## 2021-02-09 NOTE — Progress Notes (Signed)
  [facility] OFFICE PROGRESS NOTE   Diagnosis: Cholangiocarcinoma  INTERVAL HISTORY:   Mr. Epp completed day 8 gemcitabine/cisplatin on 01/26/2021.  No neuropathy symptoms, fever, or rash.  He has mild discomfort in the right abdomen.  He reports a good appetite.  No new complaint.  Objective:  Vital signs in last 24 hours:  Blood pressure (!) 100/50, pulse 97, temperature 97.8 F (36.6 C), temperature source Oral, resp. rate 20, height _0  (1.727 m), weight 173 lb 12.8 oz (78.8 kg), SpO2 96 %.    HEENT: No thrush or ulcers Resp: Lungs clear bilaterally Cardio: Regular rate and rhythm GI: The liver is palpable in the right mid and lateral upper abdomen Vascular: No leg edema  Portacath/PICC-without erythema  Lab Results:  Lab Results  Component Value Date   WBC 7.5 02/09/2021   HGB 9.7 (L) 02/09/2021   HCT 28.3 (L) 02/09/2021   MCV 92.8 02/09/2021   PLT 315 02/09/2021   NEUTROABS 5.3 02/09/2021    CMP  Lab Results  Component Value Date   NA 134 (L) 02/09/2021   K 4.5 02/09/2021   CL 97 (L) 02/09/2021   CO2 26 02/09/2021   GLUCOSE 332 (H) 02/09/2021   BUN 41 (H) 02/09/2021   CREATININE 1.05 02/09/2021   CALCIUM 9.4 02/09/2021   PROT 6.8 02/09/2021   ALBUMIN 3.7 02/09/2021   AST 23 02/09/2021   ALT 23 02/09/2021   ALKPHOS 103 02/09/2021   BILITOT 0.3 02/09/2021   GFRNONAA >60 02/09/2021   GFRAA >60 10/11/2019    Lab Results  Component Value Date   CEA 54.5 (H) 12/01/2020   AJO878 25,775 (H) 12/28/2020    Medications: I have reviewed the patient's current medications.   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 Rubey appears stable.  He will complete another cycle of gemcitabine/cisplatin/Durvalumab beginning today.  He will return for an office visit and day 8 chemotherapy in 1 week.  He declines G-CSF support.  We will follow-up on the CA 19-9 from today.  He will be referred for restaging CTs after cycle 3.  His blood sugar is elevated today.  He declines insulin therapy.  He knows to avoid concentrated sweets and contact us for a blood sugar of greater than 350.  He will check his blood sugar daily for the next several days.  Betsy Coder, MD  02/09/2021  9:06 AM

## 2021-02-09 NOTE — Patient Instructions (Signed)
Implanted Port Home Guide An implanted port is a device that is placed under the skin. It is usually placed in the chest. The device can be used to give IV medicine, to take blood, or for dialysis. You may have an implanted port if: You need IV medicine that would be irritating to the small veins in your hands or arms. You need IV medicines, such as antibiotics, for a long period of time. You need IV nutrition for a long period of time. You need dialysis. When you have a port, your health care provider can choose to use the port instead of veins in your arms for these procedures. You may have fewer limitations when using a port than you would if you used other types of long-term IVs, and you will likely be able to return to normal activities afteryour incision heals. An implanted port has two main parts: Reservoir. The reservoir is the part where a needle is inserted to give medicines or draw blood. The reservoir is round. After it is placed, it appears as a small, raised area under your skin. Catheter. The catheter is a thin, flexible tube that connects the reservoir to a vein. Medicine that is inserted into the reservoir goes into the catheter and then into the vein. How is my port accessed? To access your port: A numbing cream may be placed on the skin over the port site. Your health care provider will put on a mask and sterile gloves. The skin over your port will be cleaned carefully with a germ-killing soap and allowed to dry. Your health care provider will gently pinch the port and insert a needle into it. Your health care provider will check for a blood return to make sure the port is in the vein and is not clogged. If your port needs to remain accessed to get medicine continuously (constant infusion), your health care provider will place a clear bandage (dressing) over the needle site. The dressing and needle will need to be changed every week, or as told by your health care provider. What  is flushing? Flushing helps keep the port from getting clogged. Follow instructions from your health care provider about how and when to flush the port. Ports are usually flushed with saline solution or a medicine called heparin. The need for flushing will depend on how the port is used: If the port is only used from time to time to give medicines or draw blood, the port may need to be flushed: Before and after medicines have been given. Before and after blood has been drawn. As part of routine maintenance. Flushing may be recommended every 4-6 weeks. If a constant infusion is running, the port may not need to be flushed. Throw away any syringes in a disposal container that is meant for sharp items (sharps container). You can buy a sharps container from a pharmacy, or you can make one by using an empty hard plastic bottle with a cover. How long will my port stay implanted? The port can stay in for as long as your health care provider thinks it is needed. When it is time for the port to come out, a surgery will be done to remove it. The surgery will be similar to the procedure that was done to putthe port in. Follow these instructions at home:  Flush your port as told by your health care provider. If you need an infusion over several days, follow instructions from your health care provider about how to take   care of your port site. Make sure you: Wash your hands with soap and water before you change your dressing. If soap and water are not available, use alcohol-based hand sanitizer. Change your dressing as told by your health care provider. Place any used dressings or infusion bags into a plastic bag. Throw that bag in the trash. Keep the dressing that covers the needle clean and dry. Do not get it wet. Do not use scissors or sharp objects near the tube. Keep the tube clamped, unless it is being used. Check your port site every day for signs of infection. Check for: Redness, swelling, or  pain. Fluid or blood. Pus or a bad smell. Protect the skin around the port site. Avoid wearing bra straps that rub or irritate the site. Protect the skin around your port from seat belts. Place a soft pad over your chest if needed. Bathe or shower as told by your health care provider. The site may get wet as long as you are not actively receiving an infusion. Return to your normal activities as told by your health care provider. Ask your health care provider what activities are safe for you. Carry a medical alert card or wear a medical alert bracelet at all times. This will let health care providers know that you have an implanted port in case of an emergency. Get help right away if: You have redness, swelling, or pain at the port site. You have fluid or blood coming from your port site. You have pus or a bad smell coming from the port site. You have a fever. Summary Implanted ports are usually placed in the chest for long-term IV access. Follow instructions from your health care provider about flushing the port and changing bandages (dressings). Take care of the area around your port by avoiding clothing that puts pressure on the area, and by watching for signs of infection. Protect the skin around your port from seat belts. Place a soft pad over your chest if needed. Get help right away if you have a fever or you have redness, swelling, pain, drainage, or a bad smell at the port site. This information is not intended to replace advice given to you by your health care provider. Make sure you discuss any questions you have with your healthcare provider. Document Revised: 10/25/2019 Document Reviewed: 10/25/2019 Elsevier Patient Education  2022 [employer]  

## 2021-02-09 NOTE — Patient Instructions (Signed)
[facility]  Discharge Instructions: Thank you for choosing [facility] to provide your oncology and hematology care.   If you have a lab appointment with the [facility], please go directly to the [facility] and check in at the registration area.   Wear comfortable clothing and clothing appropriate for easy access to any Portacath or PICC line.   We strive to give you quality time with your provider. You may need to reschedule your appointment if you arrive late (15 or more minutes).  Arriving late affects you and other patients whose appointments are after yours.  Also, if you miss three or more appointments without notifying the office, you may be dismissed from the clinic at the provider's discretion.      For prescription refill requests, have your pharmacy contact our office and allow 72 hours for refills to be completed.    Today you received the following chemotherapy and/or immunotherapy agents imfinzi, gemzar, cisplatin   To help prevent nausea and vomiting after your treatment, we encourage you to take your nausea medication as directed.  BELOW ARE SYMPTOMS THAT SHOULD BE REPORTED IMMEDIATELY: *FEVER GREATER THAN 100.4 F (38 C) OR HIGHER *CHILLS OR SWEATING *NAUSEA AND VOMITING THAT IS NOT CONTROLLED WITH YOUR NAUSEA MEDICATION *UNUSUAL SHORTNESS OF BREATH *UNUSUAL BRUISING OR BLEEDING *URINARY PROBLEMS (pain or burning when urinating, or frequent urination) *BOWEL PROBLEMS (unusual diarrhea, constipation, pain near the anus) TENDERNESS IN MOUTH AND THROAT WITH OR WITHOUT PRESENCE OF ULCERS (sore throat, sores in mouth, or a toothache) UNUSUAL RASH, SWELLING OR PAIN  UNUSUAL VAGINAL DISCHARGE OR ITCHING   Items with * indicate a potential emergency and should be followed up as soon as possible or go to the Emergency Department if any problems should occur.  Please show the CHEMOTHERAPY ALERT CARD or IMMUNOTHERAPY ALERT CARD at  check-in to the Emergency Department and triage nurse.  Should you have questions after your visit or need to cancel or reschedule your appointment, please contact [facility]  Dept: [phone]  and follow the prompts.  Office hours are 8:00 a.m. to 4:30 p.m. Monday - Friday. Please note that voicemails left after 4:00 p.m. may not be returned until the following business day.  We are closed weekends and major holidays. You have access to a nurse at all times for urgent questions. Please call the main number to the clinic Dept: [phone] and follow the prompts.   For any non-urgent questions, you may also contact your provider using MyChart. We now offer e-Visits for anyone 52 and older to request care online for non-urgent symptoms. For details visit [email or web address].   Also download the MyChart app! Go to the app store, search "MyChart", open the app, select [facility], and log in with your MyChart username and password.  Due to Covid, a mask is required upon entering the hospital/clinic. If you do not have a mask, one will be given to you upon arrival. For doctor visits, patients may have 1 support person aged 17 or older with them. For treatment visits, patients cannot have anyone with them due to current Covid guidelines and our immunocompromised population.   Durvalumab injection What is this medication? DURVALUMAB (dur VAL ue mab) is a monoclonal antibody. It is used to treat lungcancer. This medicine may be used for other purposes; ask your health care provider orpharmacist if you have questions. COMMON BRAND NAME(S): IMFINZI What should I tell my care team  before I take this medication? They need to know if you have any of these conditions: autoimmune diseases like Crohn's disease, ulcerative colitis, or lupus have had or planning to have an allogeneic stem cell transplant (uses someone else's stem cells) history of organ transplant history of  radiation to the chest nervous system problems like myasthenia gravis or Guillain-Barre syndrome an unusual or allergic reaction to durvalumab, other medicines, foods, dyes, or preservatives pregnant or trying to get pregnant breast-feeding How should I use this medication? This medicine is for infusion into a vein. It is given by a health careprofessional in a hospital or clinic setting. A special MedGuide will be given to you before each treatment. Be sure to readthis information carefully each time. Talk to your pediatrician regarding the use of this medicine in children.Special care may be needed. Overdosage: If you think you have taken too much of this medicine contact apoison control center or emergency room at once. NOTE: This medicine is only for you. Do not share this medicine with others. What if I miss a dose? It is important not to miss your dose. Call your doctor or health careprofessional if you are unable to keep an appointment. What may interact with this medication? Interactions have not been studied. This list may not describe all possible interactions. Give your health care provider a list of all the medicines, herbs, non-prescription drugs, or dietary supplements you use. Also tell them if you smoke, drink alcohol, or use illegaldrugs. Some items may interact with your medicine. What should I watch for while using this medication? This drug may make you feel generally unwell. Continue your course of treatmenteven though you feel ill unless your doctor tells you to stop. You may need blood work done while you are taking this medicine. Do not become pregnant while taking this medicine or for 3 months after stopping it. Women should inform their doctor if they wish to become pregnant or think they might be pregnant. There is a potential for serious side effects to an unborn child. Talk to your health care professional or pharmacist for more information. Do not breast-feed an  infant while taking this medicine orfor 3 months after stopping it. What side effects may I notice from receiving this medication? Side effects that you should report to your doctor or health care professionalas soon as possible: allergic reactions like skin rash, itching or hives, swelling of the face, lips, or tongue black, tarry stools bloody or watery diarrhea breathing problems change in emotions or moods change in sex drive changes in vision chest pain or chest tightness chills confusion cough facial flushing fever headache signs and symptoms of high blood sugar such as dizziness; dry mouth; dry skin; fruity breath; nausea; stomach pain; increased hunger or thirst; increased urination signs and symptoms of liver injury like dark yellow or brown urine; general ill feeling or flu-like symptoms; light-colored stools; loss of appetite; nausea; right upper belly pain; unusually weak or tired; yellowing of the eyes or skin stomach pain trouble passing urine or change in the amount of urine weight gain or weight loss Side effects that usually do not require medical attention (report these toyour doctor or health care professional if they continue or are bothersome): bone pain constipation loss of appetite muscle pain nausea swelling of the ankles, feet, hands tiredness This list may not describe all possible side effects. Call your doctor for medical advice about side effects. You may report side effects to FDA at1-800-FDA-1088. Where should I  keep my medication? This drug is given in a hospital or clinic and will not be stored at home. NOTE: This sheet is a summary. It may not cover all possible information. If you have questions about this medicine, talk to your doctor, pharmacist, orhealth care provider.  2022 Elsevier/Gold Standard (2019-08-19 13:01:29)  Gemcitabine injection What is this medication? GEMCITABINE (jem SYE ta been) is a chemotherapy drug. This medicine is used  to treat many types of cancer like breast cancer, lung cancer, pancreatic cancer,and ovarian cancer. This medicine may be used for other purposes; ask your health care provider orpharmacist if you have questions. COMMON BRAND NAME(S): Gemzar, Infugem What should I tell my care team before I take this medication? They need to know if you have any of these conditions: blood disorders infection kidney disease liver disease lung or breathing disease, like asthma recent or ongoing radiation therapy an unusual or allergic reaction to gemcitabine, other chemotherapy, other medicines, foods, dyes, or preservatives pregnant or trying to get pregnant breast-feeding How should I use this medication? This drug is given as an infusion into a vein. It is administered in a hospitalor clinic by a specially trained health care professional. Talk to your pediatrician regarding the use of this medicine in children.Special care may be needed. Overdosage: If you think you have taken too much of this medicine contact apoison control center or emergency room at once. NOTE: This medicine is only for you. Do not share this medicine with others. What if I miss a dose? It is important not to miss your dose. Call your doctor or health careprofessional if you are unable to keep an appointment. What may interact with this medication? medicines to increase blood counts like filgrastim, pegfilgrastim, sargramostim some other chemotherapy drugs like cisplatin vaccines Talk to your doctor or health care professional before taking any of thesemedicines: acetaminophen aspirin ibuprofen ketoprofen naproxen This list may not describe all possible interactions. Give your health care provider a list of all the medicines, herbs, non-prescription drugs, or dietary supplements you use. Also tell them if you smoke, drink alcohol, or use illegaldrugs. Some items may interact with your medicine. What should I watch for while  using this medication? Visit your doctor for checks on your progress. This drug may make you feel generally unwell. This is not uncommon, as chemotherapy can affect healthy cells as well as cancer cells. Report any side effects. Continue your course oftreatment even though you feel ill unless your doctor tells you to stop. In some cases, you may be given additional medicines to help with side effects.Follow all directions for their use. Call your doctor or health care professional for advice if you get a fever, chills or sore throat, or other symptoms of a cold or flu. Do not treat yourself. This drug decreases your body's ability to fight infections. Try toavoid being around people who are sick. This medicine may increase your risk to bruise or bleed. Call your doctor orhealth care professional if you notice any unusual bleeding. Be careful brushing and flossing your teeth or using a toothpick because you may get an infection or bleed more easily. If you have any dental work done,tell your dentist you are receiving this medicine. Avoid taking products that contain aspirin, acetaminophen, ibuprofen, naproxen, or ketoprofen unless instructed by your doctor. These medicines may hide afever. Do not become pregnant while taking this medicine or for 6 months after stopping it. Women should inform their doctor if they wish to become pregnant  or think they might be pregnant. Men should not father a child while taking this medicine and for 3 months after stopping it. There is a potential for serious side effects to an unborn child. Talk to your health care professional or pharmacist for more information. Do not breast-feed an infant while takingthis medicine or for at least 1 week after stopping it. Men should inform their doctors if they wish to father a child. This medicine may lower sperm counts. Talk with your doctor or health care professional ifyou are concerned about your fertility. What side effects may I  notice from receiving this medication? Side effects that you should report to your doctor or health care professionalas soon as possible: allergic reactions like skin rash, itching or hives, swelling of the face, lips, or tongue breathing problems pain, redness, or irritation at site where injected signs and symptoms of a dangerous change in heartbeat or heart rhythm like chest pain; dizziness; fast or irregular heartbeat; palpitations; feeling faint or lightheaded, falls; breathing problems signs of decreased platelets or bleeding - bruising, pinpoint red spots on the skin, black, tarry stools, blood in the urine signs of decreased red blood cells - unusually weak or tired, feeling faint or lightheaded, falls signs of infection - fever or chills, cough, sore throat, pain or difficulty passing urine signs and symptoms of kidney injury like trouble passing urine or change in the amount of urine signs and symptoms of liver injury like dark yellow or brown urine; general ill feeling or flu-like symptoms; light-colored stools; loss of appetite; nausea; right upper belly pain; unusually weak or tired; yellowing of the eyes or skin swelling of ankles, feet, hands Side effects that usually do not require medical attention (report to yourdoctor or health care professional if they continue or are bothersome): constipation diarrhea hair loss loss of appetite nausea rash vomiting This list may not describe all possible side effects. Call your doctor for medical advice about side effects. You may report side effects to FDA at1-800-FDA-1088. Where should I keep my medication? This drug is given in a hospital or clinic and will not be stored at home. NOTE: This sheet is a summary. It may not cover all possible information. If you have questions about this medicine, talk to your doctor, pharmacist, orhealth care provider.  2022 Elsevier/Gold Standard (2017-09-03 18:06:11)  Cisplatin injection What is  this medication? CISPLATIN (SIS pla tin) is a chemotherapy drug. It targets fast dividing cells, like cancer cells, and causes these cells to die. This medicine is used totreat many types of cancer like bladder, ovarian, and testicular cancers. This medicine may be used for other purposes; ask your health care provider orpharmacist if you have questions. COMMON BRAND NAME(S): Platinol, Platinol -AQ What should I tell my care team before I take this medication? They need to know if you have any of these conditions: eye disease, vision problems hearing problems kidney disease low blood counts, like white cells, platelets, or red blood cells tingling of the fingers or toes, or other nerve disorder an unusual or allergic reaction to cisplatin, carboplatin, oxaliplatin, other medicines, foods, dyes, or preservatives pregnant or trying to get pregnant breast-feeding How should I use this medication? This drug is given as an infusion into a vein. It is administered in a hospitalor clinic by a specially trained health care professional. Talk to your pediatrician regarding the use of this medicine in children.Special care may be needed. Overdosage: If you think you have taken too much of  this medicine contact apoison control center or emergency room at once. NOTE: This medicine is only for you. Do not share this medicine with others. What if I miss a dose? It is important not to miss a dose. Call your doctor or health careprofessional if you are unable to keep an appointment. What may interact with this medication? This medicine may interact with the following medications: foscarnet certain antibiotics like amikacin, gentamicin, neomycin, polymyxin B, streptomycin, tobramycin, vancomycin This list may not describe all possible interactions. Give your health care provider a list of all the medicines, herbs, non-prescription drugs, or dietary supplements you use. Also tell them if you smoke, drink  alcohol, or use illegaldrugs. Some items may interact with your medicine. What should I watch for while using this medication? Your condition will be monitored carefully while you are receiving this medicine. You will need important blood work done while you are taking thismedicine. This drug may make you feel generally unwell. This is not uncommon, as chemotherapy can affect healthy cells as well as cancer cells. Report any side effects. Continue your course of treatment even though you feel ill unless yourdoctor tells you to stop. This medicine may increase your risk of getting an infection. Call your healthcare professional for advice if you get a fever, chills, or sore throat, or other symptoms of a cold or flu. Do not treat yourself. Try to avoid beingaround people who are sick. Avoid taking medicines that contain aspirin, acetaminophen, ibuprofen, naproxen, or ketoprofen unless instructed by your healthcare professional.These medicines may hide a fever. This medicine may increase your risk to bruise or bleed. Call your doctor orhealth care professional if you notice any unusual bleeding. Be careful brushing and flossing your teeth or using a toothpick because you may get an infection or bleed more easily. If you have any dental work done,tell your dentist you are receiving this medicine. Do not become pregnant while taking this medicine or for 14 months after stopping it. Women should inform their healthcare professional if they wish to become pregnant or think they might be pregnant. Men should not father a child while taking this medicine and for 11 months after stopping it. There is potential for serious side effects to an unborn child. Talk to your healthcareprofessional for more information. Do not breast-feed an infant while taking this medicine. This medicine has caused ovarian failure in some women. This medicine may make it more difficult to get pregnant. Talk to your healthcare professional  if [place] concerned about your fertility. This medicine has caused decreased sperm counts in some men. This may make it more difficult to father a child. Talk to your healthcare professional if [place] concerned about your fertility. Drink fluids as directed while you are taking this medicine. This will helpprotect your kidneys. Call your doctor or health care professional if you get diarrhea. Do not treatyourself. What side effects may I notice from receiving this medication? Side effects that you should report to your doctor or health care professionalas soon as possible: allergic reactions like skin rash, itching or hives, swelling of the face, lips, or tongue blurred vision changes in vision decreased hearing or ringing of the ears nausea, vomiting pain, redness, or irritation at site where injected pain, tingling, numbness in the hands or feet signs and symptoms of bleeding such as bloody or black, tarry stools; red or dark brown urine; spitting up blood or brown material that looks like coffee grounds; red spots on the skin; unusual bruising or  bleeding from the eyes, gums, or nose signs and symptoms of infection like fever; chills; cough; sore throat; pain or trouble passing urine signs and symptoms of kidney injury like trouble passing urine or change in the amount of urine signs and symptoms of low red blood cells or anemia such as unusually weak or tired; feeling faint or lightheaded; falls; breathing problems Side effects that usually do not require medical attention (report to yourdoctor or health care professional if they continue or are bothersome): loss of appetite mouth sores muscle cramps This list may not describe all possible side effects. Call your doctor for medical advice about side effects. You may report side effects to FDA at1-800-FDA-1088. Where should I keep my medication? This drug is given in a hospital or clinic and will not be stored at home. NOTE: This sheet is  a summary. It may not cover all possible information. If you have questions about this medicine, talk to your doctor, pharmacist, orhealth care provider.  2022 Elsevier/Gold Standard (2018-06-05 15:59:17)

## 2021-02-10 LAB — CANCER ANTIGEN 19-9: CA 19-9: 95613 U/mL — ABNORMAL HIGH (ref 0–35)

## 2021-02-12 ENCOUNTER — Ambulatory Visit: Payer: Medicare Other

## 2021-02-16 ENCOUNTER — Inpatient Hospital Stay: Payer: Medicare Other

## 2021-02-16 ENCOUNTER — Encounter: Payer: Self-pay | Admitting: Nurse Practitioner

## 2021-02-16 ENCOUNTER — Inpatient Hospital Stay (HOSPITAL_BASED_OUTPATIENT_CLINIC_OR_DEPARTMENT_OTHER): Payer: Medicare Other | Admitting: Nurse Practitioner

## 2021-02-16 ENCOUNTER — Other Ambulatory Visit: Payer: Self-pay

## 2021-02-16 VITALS — BP 98/68 | HR 82 | Temp 98.1°F | Resp 20 | Ht 68.0 in | Wt 169.6 lb

## 2021-02-16 DIAGNOSIS — C221 Intrahepatic bile duct carcinoma: Secondary | ICD-10-CM

## 2021-02-16 DIAGNOSIS — Z5112 Encounter for antineoplastic immunotherapy: Secondary | ICD-10-CM | POA: Diagnosis not present

## 2021-02-16 LAB — CBC WITH DIFFERENTIAL (CANCER CENTER ONLY)
Abs Immature Granulocytes: 0.03 10*3/uL (ref 0.00–0.07)
Basophils Absolute: 0.1 10*3/uL (ref 0.0–0.1)
Basophils Relative: 1 %
Eosinophils Absolute: 0.2 10*3/uL (ref 0.0–0.5)
Eosinophils Relative: 4 %
HCT: 27.2 % — ABNORMAL LOW (ref 39.0–52.0)
Hemoglobin: 9.4 g/dL — ABNORMAL LOW (ref 13.0–17.0)
Immature Granulocytes: 1 %
Lymphocytes Relative: 16 %
Lymphs Abs: 0.7 10*3/uL (ref 0.7–4.0)
MCH: 32.4 pg (ref 26.0–34.0)
MCHC: 34.6 g/dL (ref 30.0–36.0)
MCV: 93.8 fL (ref 80.0–100.0)
Monocytes Absolute: 0.5 10*3/uL (ref 0.1–1.0)
Monocytes Relative: 12 %
Neutro Abs: 2.9 10*3/uL (ref 1.7–7.7)
Neutrophils Relative %: 66 %
Platelet Count: 396 10*3/uL (ref 150–400)
RBC: 2.9 MIL/uL — ABNORMAL LOW (ref 4.22–5.81)
RDW: 16 % — ABNORMAL HIGH (ref 11.5–15.5)
WBC Count: 4.4 10*3/uL (ref 4.0–10.5)
nRBC: 0 % (ref 0.0–0.2)

## 2021-02-16 LAB — CMP (CANCER CENTER ONLY)
ALT: 22 U/L (ref 0–44)
AST: 23 U/L (ref 15–41)
Albumin: 3.7 g/dL (ref 3.5–5.0)
Alkaline Phosphatase: 89 U/L (ref 38–126)
Anion gap: 12 (ref 5–15)
BUN: 40 mg/dL — ABNORMAL HIGH (ref 8–23)
CO2: 25 mmol/L (ref 22–32)
Calcium: 10 mg/dL (ref 8.9–10.3)
Chloride: 97 mmol/L — ABNORMAL LOW (ref 98–111)
Creatinine: 1.24 mg/dL (ref 0.61–1.24)
GFR, Estimated: >60 mL/min (ref >60)
Glucose, Bld: 248 mg/dL — ABNORMAL HIGH (ref 70–99)
Potassium: 5 mmol/L (ref 3.5–5.1)
Sodium: 134 mmol/L — ABNORMAL LOW (ref 135–145)
Total Bilirubin: 0.4 mg/dL (ref 0.3–1.2)
Total Protein: 7.3 g/dL (ref 6.5–8.1)

## 2021-02-16 LAB — MAGNESIUM: Magnesium: 1.7 mg/dL (ref 1.7–2.4)

## 2021-02-16 MED ORDER — SODIUM CHLORIDE 0.9 % IV SOLN: INTRAVENOUS | Status: AC

## 2021-02-16 MED ORDER — SODIUM CHLORIDE 0.9 % IV SOLN: Freq: Once | INTRAVENOUS | Status: AC

## 2021-02-16 MED ORDER — SODIUM CHLORIDE 0.9% FLUSH
10.0000 mL | Freq: Once | INTRAVENOUS | Status: AC
  Administered 2021-02-16: 10 mL via INTRAVENOUS

## 2021-02-16 MED ORDER — HEPARIN SOD (PORK) LOCK FLUSH 100 UNIT/ML IV SOLN
500.0000 [IU] | Freq: Once | INTRAVENOUS | Status: AC
  Administered 2021-02-16: 500 [IU] via INTRAVENOUS

## 2021-02-16 NOTE — Progress Notes (Signed)
0835 Patient got up from infusion room chair after port flush with lab draw and began walking towards the wall. He started stumbling and put his hands towards the wall and began to fall. He slid down the wall with his right side of body against the wall and right side of forehead pressed against the wall. After he slid down the wall, he landed on his right buttock/hip. He was conscious throughout the episode. He was trying to independently get up from the floor. He required a 2 person assist to get up out of the floor. He was immediately sat down in an infusion chair and was assessed. Vitals were obtained by [place] Jack NT. BP 98/68 sitting, Pulse 82, Resp 20, 97% Room Air. Patient in stable condition with no injuries or complaints post fall. Skin assessment WNL. [name] NP notified of fall immediately after it occurred.

## 2021-02-16 NOTE — Progress Notes (Signed)
Reported final vital signs see flowsheet to Leander Rams, NP. Pt has no complaints of dizziness. OK to discharge patient

## 2021-02-16 NOTE — Progress Notes (Signed)
   02/16/21 0835  What Happened  Was fall witnessed? Yes  Who witnessed fall? Tedd Sias  Patients activity before fall ambulating-unassisted  Point of contact head (rt shoulder, right side of body)  Was patient injured? No  Follow Up  MD notified Dr. Benay Spice  Time MD notified [phone]  Family notified Yes - comment (we tried to call wife @ 63 - no answer)  Time family notified 0850  Additional tests No  Adult Fall Risk Assessment  Age 70  Fall History: Fall within 6 months prior to admission 5  Elimination; Bowel and/or Urine Incontinence 0  Elimination; Bowel and/or Urine Urgency/Frequency 0  Medications: includes PCA/Opiates, Anti-convulsants, Anti-hypertensives, Diuretics, Hypnotics, Laxatives, Sedatives, and Psychotropics 3  Patient Care Equipment 0  Mobility-Assistance 0  Mobility-Gait 2  Mobility-Sensory Deficit 0  Altered awareness of immediate physical environment 0  Impulsiveness 2  Lack of understanding of one's physical/cognitive limitations 4  Total Score 18  Adult Fall Risk Interventions  Required Bundle Interventions *See Row Information* High fall risk - low, moderate, and high requirements implemented  Additional Interventions Assess orthostatic BP  Vitals  Temp 98.1 F (36.7 C)  BP 98/68  BP Location Left Arm  Pulse Rate 82  Resp 20  Oxygen Therapy  SpO2 97 %  Pain Assessment  Pain Score 0  Neurological  Neuro (WDL) WDL  Musculoskeletal  Musculoskeletal (WDL) WDL  Integumentary  Integumentary (WDL) WDL  RN Assisting with Skin Assessment on Admission Tedd Sias RN

## 2021-02-16 NOTE — Progress Notes (Signed)
  [facility] OFFICE PROGRESS NOTE   Diagnosis: Cholangiocarcinoma  INTERVAL HISTORY:   Mr. Carreon returns as scheduled.  He completed cycle 3-day 1 gemcitabine/cisplatin/durvalumab 02/09/2021.  He reports his neck has been giving him "a fit".  He reports cervical spine fusions in the past.  He wears a neck brace periodically when he "tenses up" at the posterior neck and shoulders.  This has been occurring more frequently.  After the Port-A-Cath was accessed this morning he said he stood up, took a few steps and had the sensation he was walking down a hill.  He reports sliding down a wall.  He thinks his forehead hit the wall.  He denies headache, no visual disturbance.  He is not lightheaded or dizzy.  He does not take blood pressure medication.  No nausea or vomiting.  No diarrhea.  He feels his fluid intake is adequate.  He estimates drinking 40 ounces of water yesterday in addition to some milk.  Objective:  Vital signs in last 24 hours:  Blood pressure 98/68, pulse 82, temperature 98.1 F (36.7 C), temperature source Oral, resp. rate 20, height 5' 8" (1.727 m), weight 169 lb 9.6 oz (76.9 kg), SpO2 97 %.    HEENT: Tongue appears dry.  White coating over tongue. Resp: Lungs clear bilaterally. Cardio: Regular rate and rhythm. GI: Liver is palpable in the right mid/lateral upper abdomen. Vascular: No leg edema. Neuro: He is alert and oriented.  Follows commands.  Moves all extremities. Skin: Pale appearing.  Decreased skin turgor.  Forehead without erythema or abrasion. Port-A-Cath without erythema.   Lab Results:  Lab Results  Component Value Date   WBC 4.4 02/16/2021   HGB 9.4 (L) 02/16/2021   HCT 27.2 (L) 02/16/2021   MCV 93.8 02/16/2021   PLT 396 02/16/2021   NEUTROABS 2.9 02/16/2021    Imaging:  No results found.  Medications: I have reviewed the patient's current medications.  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 Seth has completed 2 full cycles of gemcitabine/cisplatin/durvalumab and cycle 3-day 1.  He presents to the office today prior to proceeding with day 8 gemcitabine/cisplatin.  He is orthostatic, likely dehydrated.  We decided to hold today's treatment, give IV fluids instead.  Othostatic vital signs will be repeated after 500 cc and 1000 cc.  Oral fluid intake will be encouraged as well.  In terms of the cholangiocarcinoma restaging CTs will be scheduled prior to the next office visit in 2 weeks.  Plan reviewed with Dr. Benay Spice.    [name] ANP/GNP-BC   02/16/2021  9:00 AM

## 2021-02-16 NOTE — Progress Notes (Signed)
Holding treatment today, received request to delete day 8 and 9 completely from this cycle.  V.O. [name], NP/Deserea Bordley Ronnald Ramp, PharmD

## 2021-02-16 NOTE — Addendum Note (Signed)
Addended by: Tania Ade on: 02/16/2021 10:13 AM   Modules accepted: Orders

## 2021-02-16 NOTE — Patient Instructions (Signed)

## 2021-02-19 ENCOUNTER — Telehealth: Payer: Self-pay

## 2021-02-19 NOTE — Telephone Encounter (Signed)
Telephone call to patient post fall on Friday 02/16/2021. Unable to reach patient, left voice message.

## 2021-02-26 ENCOUNTER — Other Ambulatory Visit: Payer: Self-pay | Admitting: Oncology

## 2021-02-27 ENCOUNTER — Inpatient Hospital Stay: Payer: Medicare Other | Attending: Oncology

## 2021-02-27 ENCOUNTER — Ambulatory Visit (HOSPITAL_BASED_OUTPATIENT_CLINIC_OR_DEPARTMENT_OTHER)
Admission: RE | Admit: 2021-02-27 | Discharge: 2021-02-27 | Disposition: A | Discharge status: Home / Self Care | Payer: Medicare Other | Source: Ambulatory Visit | Attending: Nurse Practitioner | Admitting: Nurse Practitioner

## 2021-02-27 ENCOUNTER — Other Ambulatory Visit: Payer: Self-pay

## 2021-02-27 DIAGNOSIS — Z5111 Encounter for antineoplastic chemotherapy: Secondary | ICD-10-CM | POA: Diagnosis present

## 2021-02-27 DIAGNOSIS — G893 Neoplasm related pain (acute) (chronic): Secondary | ICD-10-CM | POA: Diagnosis not present

## 2021-02-27 DIAGNOSIS — C221 Intrahepatic bile duct carcinoma: Secondary | ICD-10-CM | POA: Insufficient documentation

## 2021-02-27 DIAGNOSIS — Z79899 Other long term (current) drug therapy: Secondary | ICD-10-CM | POA: Insufficient documentation

## 2021-02-27 DIAGNOSIS — Z95828 Presence of other vascular implants and grafts: Secondary | ICD-10-CM

## 2021-02-27 MED ORDER — IOHEXOL 350 MG/ML SOLN
100.0000 mL | Freq: Once | INTRAVENOUS | Status: AC | PRN
  Administered 2021-02-27: 80 mL via INTRAVENOUS

## 2021-02-27 MED ORDER — SODIUM CHLORIDE 0.9% FLUSH
10.0000 mL | Freq: Once | INTRAVENOUS | Status: AC
  Administered 2021-02-27: 10 mL via INTRAVENOUS

## 2021-02-27 MED ORDER — SODIUM CHLORIDE 0.9% FLUSH
10.0000 mL | INTRAVENOUS | Status: DC | PRN
  Administered 2021-02-27: 10 mL via INTRAVENOUS

## 2021-02-27 MED ORDER — HEPARIN SOD (PORK) LOCK FLUSH 100 UNIT/ML IV SOLN
500.0000 [IU] | Freq: Once | INTRAVENOUS | Status: AC
  Administered 2021-02-27: 500 [IU] via INTRAVENOUS

## 2021-02-27 NOTE — Patient Instructions (Signed)
Implanted Port Home Guide An implanted port is a device that is placed under the skin. It is usually placed in the chest. The device can be used to give IV medicine, to take blood, or for dialysis. You may have an implanted port if: You need IV medicine that would be irritating to the small veins in your hands or arms. You need IV medicines, such as antibiotics, for a long period of time. You need IV nutrition for a long period of time. You need dialysis. When you have a port, your health care provider can choose to use the port instead of veins in your arms for these procedures. You may have fewer limitations when using a port than you would if you used other types of long-term IVs, and you will likely be able to return to normal activities after your incision heals. An implanted port has two main parts: Reservoir. The reservoir is the part where a needle is inserted to give medicines or draw blood. The reservoir is round. After it is placed, it appears as a small, raised area under your skin. Catheter. The catheter is a thin, flexible tube that connects the reservoir to a vein. Medicine that is inserted into the reservoir goes into the catheter and then into the vein. How is my port accessed? To access your port: A numbing cream may be placed on the skin over the port site. Your health care provider will put on a mask and sterile gloves. The skin over your port will be cleaned carefully with a germ-killing soap and allowed to dry. Your health care provider will gently pinch the port and insert a needle into it. Your health care provider will check for a blood return to make sure the port is in the vein and is not clogged. If your port needs to remain accessed to get medicine continuously (constant infusion), your health care provider will place a clear bandage (dressing) over the needle site. The dressing and needle will need to be changed every week, or as told by your health care provider. What  is flushing? Flushing helps keep the port from getting clogged. Follow instructions from your health care provider about how and when to flush the port. Ports are usually flushed with saline solution or a medicine called heparin. The need for flushing will depend on how the port is used: If the port is only used from time to time to give medicines or draw blood, the port may need to be flushed: Before and after medicines have been given. Before and after blood has been drawn. As part of routine maintenance. Flushing may be recommended every 4-6 weeks. If a constant infusion is running, the port may not need to be flushed. Throw away any syringes in a disposal container that is meant for sharp items (sharps container). You can buy a sharps container from a pharmacy, or you can make one by using an empty hard plastic bottle with a cover. How long will my port stay implanted? The port can stay in for as long as your health care provider thinks it is needed. When it is time for the port to come out, a surgery will be done to remove it. The surgery will be similar to the procedure that was done to put the port in. Follow these instructions at home:  Flush your port as told by your health care provider. If you need an infusion over several days, follow instructions from your health care provider about how   to take care of your port site. Make sure you: Wash your hands with soap and water before you change your dressing. If soap and water are not available, use alcohol-based hand sanitizer. Change your dressing as told by your health care provider. Place any used dressings or infusion bags into a plastic bag. Throw that bag in the trash. Keep the dressing that covers the needle clean and dry. Do not get it wet. Do not use scissors or sharp objects near the tube. Keep the tube clamped, unless it is being used. Check your port site every day for signs of infection. Check for: Redness, swelling, or  pain. Fluid or blood. Pus or a bad smell. Protect the skin around the port site. Avoid wearing bra straps that rub or irritate the site. Protect the skin around your port from seat belts. Place a soft pad over your chest if needed. Bathe or shower as told by your health care provider. The site may get wet as long as you are not actively receiving an infusion. Return to your normal activities as told by your health care provider. Ask your health care provider what activities are safe for you. Carry a medical alert card or wear a medical alert bracelet at all times. This will let health care providers know that you have an implanted port in case of an emergency. Get help right away if: You have redness, swelling, or pain at the port site. You have fluid or blood coming from your port site. You have pus or a bad smell coming from the port site. You have a fever. Summary Implanted ports are usually placed in the chest for long-term IV access. Follow instructions from your health care provider about flushing the port and changing bandages (dressings). Take care of the area around your port by avoiding clothing that puts pressure on the area, and by watching for signs of infection. Protect the skin around your port from seat belts. Place a soft pad over your chest if needed. Get help right away if you have a fever or you have redness, swelling, pain, drainage, or a bad smell at the port site. This information is not intended to replace advice given to you by your health care provider. Make sure you discuss any questions you have with your health care provider. Document Revised: 08/30/2020 Document Reviewed: 10/25/2019 Elsevier Patient Education  2022 [employer]  

## 2021-02-27 NOTE — Addendum Note (Signed)
Addended by: Teodoro Spray on: 02/27/2021 09:11 AM   Modules accepted: Orders

## 2021-03-02 ENCOUNTER — Inpatient Hospital Stay (HOSPITAL_BASED_OUTPATIENT_CLINIC_OR_DEPARTMENT_OTHER): Payer: Medicare Other | Admitting: Nurse Practitioner

## 2021-03-02 ENCOUNTER — Encounter: Payer: Self-pay | Admitting: Nurse Practitioner

## 2021-03-02 ENCOUNTER — Inpatient Hospital Stay: Payer: Medicare Other

## 2021-03-02 ENCOUNTER — Other Ambulatory Visit: Payer: Self-pay

## 2021-03-02 VITALS — BP 98/70 | HR 99 | Temp 98.1°F | Resp 18 | Ht 68.0 in | Wt 169.0 lb

## 2021-03-02 DIAGNOSIS — C221 Intrahepatic bile duct carcinoma: Secondary | ICD-10-CM

## 2021-03-02 DIAGNOSIS — Z5111 Encounter for antineoplastic chemotherapy: Secondary | ICD-10-CM | POA: Diagnosis not present

## 2021-03-02 DIAGNOSIS — Z95828 Presence of other vascular implants and grafts: Secondary | ICD-10-CM | POA: Diagnosis not present

## 2021-03-02 LAB — CBC WITH DIFFERENTIAL (CANCER CENTER ONLY)
Abs Immature Granulocytes: 0.03 10*3/uL (ref 0.00–0.07)
Basophils Absolute: 0.1 10*3/uL (ref 0.0–0.1)
Basophils Relative: 1 %
Eosinophils Absolute: 0.3 10*3/uL (ref 0.0–0.5)
Eosinophils Relative: 3 %
HCT: 28.8 % — ABNORMAL LOW (ref 39.0–52.0)
Hemoglobin: 9.8 g/dL — ABNORMAL LOW (ref 13.0–17.0)
Immature Granulocytes: 0 %
Lymphocytes Relative: 10 %
Lymphs Abs: 0.8 10*3/uL (ref 0.7–4.0)
MCH: 32.9 pg (ref 26.0–34.0)
MCHC: 34 g/dL (ref 30.0–36.0)
MCV: 96.6 fL (ref 80.0–100.0)
Monocytes Absolute: 1.3 10*3/uL — ABNORMAL HIGH (ref 0.1–1.0)
Monocytes Relative: 16 %
Neutro Abs: 5.6 10*3/uL (ref 1.7–7.7)
Neutrophils Relative %: 70 %
Platelet Count: 409 10*3/uL — ABNORMAL HIGH (ref 150–400)
RBC: 2.98 MIL/uL — ABNORMAL LOW (ref 4.22–5.81)
RDW: 17.5 % — ABNORMAL HIGH (ref 11.5–15.5)
WBC Count: 8.1 10*3/uL (ref 4.0–10.5)
nRBC: 0 % (ref 0.0–0.2)

## 2021-03-02 LAB — TSH: TSH: 7.17 u[IU]/mL — ABNORMAL HIGH (ref 0.350–4.500)

## 2021-03-02 LAB — CMP (CANCER CENTER ONLY)
ALT: 20 U/L (ref 0–44)
AST: 25 U/L (ref 15–41)
Albumin: 3.9 g/dL (ref 3.5–5.0)
Alkaline Phosphatase: 100 U/L (ref 38–126)
Anion gap: 11 (ref 5–15)
BUN: 33 mg/dL — ABNORMAL HIGH (ref 8–23)
CO2: 25 mmol/L (ref 22–32)
Calcium: 9.8 mg/dL (ref 8.9–10.3)
Chloride: 97 mmol/L — ABNORMAL LOW (ref 98–111)
Creatinine: 1.09 mg/dL (ref 0.61–1.24)
GFR, Estimated: >60 mL/min (ref >60)
Glucose, Bld: 326 mg/dL — ABNORMAL HIGH (ref 70–99)
Potassium: 4.7 mmol/L (ref 3.5–5.1)
Sodium: 133 mmol/L — ABNORMAL LOW (ref 135–145)
Total Bilirubin: 0.4 mg/dL (ref 0.3–1.2)
Total Protein: 7.5 g/dL (ref 6.5–8.1)

## 2021-03-02 LAB — MAGNESIUM: Magnesium: 1.7 mg/dL (ref 1.7–2.4)

## 2021-03-02 MED ORDER — SODIUM CHLORIDE 0.9% FLUSH
10.0000 mL | INTRAVENOUS | Status: DC | PRN
  Administered 2021-03-02: 10 mL via INTRAVENOUS

## 2021-03-02 MED ORDER — HEPARIN SOD (PORK) LOCK FLUSH 100 UNIT/ML IV SOLN
500.0000 [IU] | Freq: Once | INTRAVENOUS | Status: AC
  Administered 2021-03-02: 500 [IU] via INTRAVENOUS

## 2021-03-02 NOTE — Patient Instructions (Signed)
Implanted Port Home Guide An implanted port is a device that is placed under the skin. It is usually placed in the chest. The device can be used to give IV medicine, to take blood, or for dialysis. You may have an implanted port if: You need IV medicine that would be irritating to the small veins in your hands or arms. You need IV medicines, such as antibiotics, for a long period of time. You need IV nutrition for a long period of time. You need dialysis. When you have a port, your health care provider can choose to use the port instead of veins in your arms for these procedures. You may have fewer limitations when using a port than you would if you used other types of long-term IVs, and you will likely be able to return to normal activities after your incision heals. An implanted port has two main parts: Reservoir. The reservoir is the part where a needle is inserted to give medicines or draw blood. The reservoir is round. After it is placed, it appears as a small, raised area under your skin. Catheter. The catheter is a thin, flexible tube that connects the reservoir to a vein. Medicine that is inserted into the reservoir goes into the catheter and then into the vein. How is my port accessed? To access your port: A numbing cream may be placed on the skin over the port site. Your health care provider will put on a mask and sterile gloves. The skin over your port will be cleaned carefully with a germ-killing soap and allowed to dry. Your health care provider will gently pinch the port and insert a needle into it. Your health care provider will check for a blood return to make sure the port is in the vein and is not clogged. If your port needs to remain accessed to get medicine continuously (constant infusion), your health care provider will place a clear bandage (dressing) over the needle site. The dressing and needle will need to be changed every week, or as told by your health care provider. What  is flushing? Flushing helps keep the port from getting clogged. Follow instructions from your health care provider about how and when to flush the port. Ports are usually flushed with saline solution or a medicine called heparin. The need for flushing will depend on how the port is used: If the port is only used from time to time to give medicines or draw blood, the port may need to be flushed: Before and after medicines have been given. Before and after blood has been drawn. As part of routine maintenance. Flushing may be recommended every 4-6 weeks. If a constant infusion is running, the port may not need to be flushed. Throw away any syringes in a disposal container that is meant for sharp items (sharps container). You can buy a sharps container from a pharmacy, or you can make one by using an empty hard plastic bottle with a cover. How long will my port stay implanted? The port can stay in for as long as your health care provider thinks it is needed. When it is time for the port to come out, a surgery will be done to remove it. The surgery will be similar to the procedure that was done to put the port in. Follow these instructions at home:  Flush your port as told by your health care provider. If you need an infusion over several days, follow instructions from your health care provider about how   to take care of your port site. Make sure you: Wash your hands with soap and water before you change your dressing. If soap and water are not available, use alcohol-based hand sanitizer. Change your dressing as told by your health care provider. Place any used dressings or infusion bags into a plastic bag. Throw that bag in the trash. Keep the dressing that covers the needle clean and dry. Do not get it wet. Do not use scissors or sharp objects near the tube. Keep the tube clamped, unless it is being used. Check your port site every day for signs of infection. Check for: Redness, swelling, or  pain. Fluid or blood. Pus or a bad smell. Protect the skin around the port site. Avoid wearing bra straps that rub or irritate the site. Protect the skin around your port from seat belts. Place a soft pad over your chest if needed. Bathe or shower as told by your health care provider. The site may get wet as long as you are not actively receiving an infusion. Return to your normal activities as told by your health care provider. Ask your health care provider what activities are safe for you. Carry a medical alert card or wear a medical alert bracelet at all times. This will let health care providers know that you have an implanted port in case of an emergency. Get help right away if: You have redness, swelling, or pain at the port site. You have fluid or blood coming from your port site. You have pus or a bad smell coming from the port site. You have a fever. Summary Implanted ports are usually placed in the chest for long-term IV access. Follow instructions from your health care provider about flushing the port and changing bandages (dressings). Take care of the area around your port by avoiding clothing that puts pressure on the area, and by watching for signs of infection. Protect the skin around your port from seat belts. Place a soft pad over your chest if needed. Get help right away if you have a fever or you have redness, swelling, pain, drainage, or a bad smell at the port site. This information is not intended to replace advice given to you by your health care provider. Make sure you discuss any questions you have with your health care provider. Document Revised: 08/30/2020 Document Reviewed: 10/25/2019 Elsevier Patient Education  2022 [employer]  

## 2021-03-02 NOTE — Patient Instructions (Signed)
Provided patient with reading sheets on oxaliplatin, leucovorin and fluorouracil as well has oxaliplatin neuropathy sheet

## 2021-03-02 NOTE — Progress Notes (Signed)
[facility] OFF[facility] PROGRESS NOTE   Diagnosis: Cholangiocarcinoma  INTERVAL HISTORY:   [facility] returns as scheduled.  Cycle 3-day 8 held due to dehydration.  He received IV fluids.  He is feeling better.  He thinks fluid intake is adequate.  Appetite remains poor.  He cites a taste alteration.  No falls.  No nausea.  No diarrhea.  Recent episode of pain right upper abdomen.  Objective:  Vital signs in last 24 hours:  Blood pressure 98/70, pulse 99, temperature 98.1 F (36.7 C), temperature source Oral, resp. rate 18, height 5' 8"  (1.727 m), weight 169 lb (76.7 kg), SpO2 100 %.    HEENT: No thrush or ulcers. Resp: Lungs clear bilaterally. Cardio: Regular rate and rhythm. GI: Liver palpable right upper medial abdomen. Vascular: No leg edema. Neuro: Alert and oriented.  Port-A-Cath without erythema.  Lab Results:  Lab Results  Component Value Date   WBC 4.4 02/16/2021   HGB 9.4 (L) 02/16/2021   HCT 27.2 (L) 02/16/2021   MCV 93.8 02/16/2021   PLT 396 02/16/2021   NEUTROABS 2.9 02/16/2021    Imaging:  No results found.  Medications: I have reviewed the patient's current medications.  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 Glaucoma Anorexia/weight loss secondary to #1 Pain secondary to #1 Port-A-Cath placement 12/27/2020  Disposition: [facility] appears stable.  He has completed 2 full cycles of gemcitabine/cisplatin/durvalumab and cycle 3-day 1.  Restaging CTs show evidence of progression.  Dr. Benay Spice reviewed the report/images with Erik [facility] at today's visit.  Options were discussed including different systemic therapy versus supportive/comfort care.  Erik Fletcher would like to continue treatment.  Dr. Benay Spice recommends the FOLFOX regimen.  We discussed potential toxicities associated with oxaliplatin including allergic reaction, nausea, cold sensitivity, peripheral neuropathy, acute laryngopharyngeal dysesthesia, diplopia, ataxia, jaw pain, incontinence.  We reviewed potential toxicities associated 5-fluorouracil including mouth sores, diarrhea, hand-foot syndrome, increased sensitivity to sun, rash.  He agrees to proceed.  He will return next week for cycle 1 FOLFOX.  We will see him in follow-up prior to cycle 2.  He will contact the office in the interim with any problems.  Patient seen with Dr. Benay Spice.  [name] ANP/GNP-BC   03/02/2021  8:17 AM This was a shared visit with [name].  We reviewed the restaging CT results and images with [facility].  We discussed treatment options.  We discussed the expected benefit from salvage systemic therapy.  We discussed comfort care.  He would like to proceed with second line chemotherapy.  I recommend FOLFOX.  We reviewed potential toxicities associated with the FOLFOX  regimen.  He agrees to proceed.  A chemotherapy plan was entered today.  I was present for greater than 50% of today's  visit.  I performed medical decision making.  Julieanne Manson, MD

## 2021-03-02 NOTE — Progress Notes (Signed)
DISCONTINUE OFF PATHWAY REGIMEN - Other   [id number]:Cisplatin 80 mg/m2 IV D1 + Gemcitabine 1,000 mg/m2 IV D1,8 q21 Days:   A cycle is every 21 days:     Gemcitabine      Cisplatin   **Always confirm dose/schedule in your pharmacy ordering system**  REASON: Disease Progression PRIOR TREATMENT: Cisplatin 80 mg/m2 IV D1 + Gemcitabine 1,000 mg/m2 IV D1,8 q21 Days TREATMENT RESPONSE: Progressive Disease (PD)  START OFF PATHWAY REGIMEN - Other   [id number] (Leucovorin IV D1 + Fluorouracil IV D1/CIV D1,2 + Oxaliplatin IV D1) q14 Days:   A cycle is every 14 days:     Oxaliplatin      Leucovorin      Fluorouracil      Fluorouracil   **Always confirm dose/schedule in your pharmacy ordering system**  Patient Characteristics: Intent of Therapy: Non-Curative / Palliative Intent, Discussed with Patient

## 2021-03-03 LAB — CANCER ANTIGEN 19-9: CA 19-9: 99891 U/mL — ABNORMAL HIGH (ref 0–35)

## 2021-03-06 ENCOUNTER — Inpatient Hospital Stay: Payer: Medicare Other

## 2021-03-06 ENCOUNTER — Other Ambulatory Visit: Payer: Self-pay

## 2021-03-06 ENCOUNTER — Other Ambulatory Visit: Payer: Self-pay | Admitting: Oncology

## 2021-03-06 VITALS — BP 108/79 | HR 80 | Temp 97.9°F | Resp 18

## 2021-03-06 DIAGNOSIS — C221 Intrahepatic bile duct carcinoma: Secondary | ICD-10-CM

## 2021-03-06 DIAGNOSIS — Z5111 Encounter for antineoplastic chemotherapy: Secondary | ICD-10-CM | POA: Diagnosis not present

## 2021-03-06 MED ORDER — LEUCOVORIN CALCIUM INJECTION 350 MG
400.0000 mg/m2 | Freq: Once | INTRAVENOUS | Status: AC
  Administered 2021-03-06: 768 mg via INTRAVENOUS
  Filled 2021-03-06: qty 38.4

## 2021-03-06 MED ORDER — PALONOSETRON HCL INJECTION 0.25 MG/5ML
0.2500 mg | Freq: Once | INTRAVENOUS | Status: AC
  Administered 2021-03-06: 0.25 mg via INTRAVENOUS
  Filled 2021-03-06: qty 5

## 2021-03-06 MED ORDER — FLUOROURACIL CHEMO INJECTION 2.5 GM/50ML
400.0000 mg/m2 | Freq: Once | INTRAVENOUS | Status: AC
  Administered 2021-03-06: 750 mg via INTRAVENOUS
  Filled 2021-03-06: qty 15

## 2021-03-06 MED ORDER — FLUOROURACIL CHEMO INJECTION 5 GM/100ML
5000.0000 mg | INTRAVENOUS | Status: DC
  Administered 2021-03-06: 5000 mg via INTRAVENOUS
  Filled 2021-03-06: qty 100

## 2021-03-06 MED ORDER — OXALIPLATIN CHEMO INJECTION 100 MG/20ML
85.0000 mg/m2 | Freq: Once | INTRAVENOUS | Status: AC
  Administered 2021-03-06: 165 mg via INTRAVENOUS
  Filled 2021-03-06: qty 33

## 2021-03-06 MED ORDER — SODIUM CHLORIDE 0.9 % IV SOLN
10.0000 mg | Freq: Once | INTRAVENOUS | Status: AC
  Administered 2021-03-06: 10 mg via INTRAVENOUS
  Filled 2021-03-06: qty 1

## 2021-03-06 MED ORDER — DEXTROSE 5 % IV SOLN: Freq: Once | INTRAVENOUS | Status: AC

## 2021-03-06 NOTE — Patient Instructions (Addendum)
[facility]  Discharge Instructions: Thank you for choosing [facility] to provide your oncology and hematology care.   If you have a lab appointment with the [facility], please go directly to the [facility] and check in at the registration area.   Wear comfortable clothing and clothing appropriate for easy access to any Portacath or PICC line.   We strive to give you quality time with your provider. You may need to reschedule your appointment if you arrive late (15 or more minutes).  Arriving late affects you and other patients whose appointments are after yours.  Also, if you miss three or more appointments without notifying the office, you may be dismissed from the clinic at the provider's discretion.      For prescription refill requests, have your pharmacy contact our office and allow 72 hours for refills to be completed.    Today you received the following chemotherapy and/or immunotherapy agents OXALAPLATIN, LEUCOVOIIN, 5 FU      To help prevent nausea and vomiting after your treatment, we encourage you to take your nausea medication as directed.  BELOW ARE SYMPTOMS THAT SHOULD BE REPORTED IMMEDIATELY: *FEVER GREATER THAN 100.4 F (38 C) OR HIGHER *CHILLS OR SWEATING *NAUSEA AND VOMITING THAT IS NOT CONTROLLED WITH YOUR NAUSEA MEDICATION *UNUSUAL SHORTNESS OF BREATH *UNUSUAL BRUISING OR BLEEDING *URINARY PROBLEMS (pain or burning when urinating, or frequent urination) *BOWEL PROBLEMS (unusual diarrhea, constipation, pain near the anus) TENDERNESS IN MOUTH AND THROAT WITH OR WITHOUT PRESENCE OF ULCERS (sore throat, sores in mouth, or a toothache) UNUSUAL RASH, SWELLING OR PAIN  UNUSUAL VAGINAL DISCHARGE OR ITCHING   Items with * indicate a potential emergency and should be followed up as soon as possible or go to the Emergency Department if any problems should occur.  Please show the CHEMOTHERAPY ALERT CARD or IMMUNOTHERAPY ALERT CARD  at check-in to the Emergency Department and triage nurse.  Should you have questions after your visit or need to cancel or reschedule your appointment, please contact [facility]  Dept: [phone]  and follow the prompts.  Office hours are 8:00 a.m. to 4:30 p.m. Monday - Friday. Please note that voicemails left after 4:00 p.m. may not be returned until the following business day.  We are closed weekends and major holidays. You have access to a nurse at all times for urgent questions. Please call the main number to the clinic Dept: [phone] and follow the prompts.   For any non-urgent questions, you may also contact your provider using MyChart. We now offer e-Visits for anyone 53 and older to request care online for non-urgent symptoms. For details visit [email or web address].   Also download the MyChart app! Go to the app store, search "MyChart", open the app, select , and log in with your MyChart username and password.  Due to Covid, a mask is required upon entering the hospital/clinic. If you do not have a mask, one will be given to you upon arrival. For doctor visits, patients may have 1 support person aged 27 or older with them. For treatment visits, patients cannot have anyone with them due to current Covid guidelines and our immunocompromised population.    Oxaliplatin Injection What is this medication? OXALIPLATIN (ox AL i PLA tin) is a chemotherapy drug. It targets fast dividing cells, like cancer cells, and causes these cells to die. This medicine is used to treat cancers of the colon and rectum, and many other  cancers. This medicine may be used for other purposes; ask your health care provider or pharmacist if you have questions. COMMON BRAND NAME(S): Eloxatin What should I tell my care team before I take this medication? They need to know if you have any of these conditions: heart disease history of irregular heartbeat liver disease low  blood counts, like white cells, platelets, or red blood cells lung or breathing disease, like asthma take medicines that treat or prevent blood clots tingling of the fingers or toes, or other nerve disorder an unusual or allergic reaction to oxaliplatin, other chemotherapy, other medicines, foods, dyes, or preservatives pregnant or trying to get pregnant breast-feeding How should I use this medication? This drug is given as an infusion into a vein. It is administered in a hospital or clinic by a specially trained health care professional. Talk to your pediatrician regarding the use of this medicine in children. Special care may be needed. Overdosage: If you think you have taken too much of this medicine contact a poison control center or emergency room at once. NOTE: This medicine is only for you. Do not share this medicine with others. What if I miss a dose? It is important not to miss a dose. Call your doctor or health care professional if you are unable to keep an appointment. What may interact with this medication? Do not take this medicine with any of the following medications: cisapride dronedarone pimozide thioridazine This medicine may also interact with the following medications: aspirin and aspirin-like medicines certain medicines that treat or prevent blood clots like warfarin, apixaban, dabigatran, and rivaroxaban cisplatin cyclosporine diuretics medicines for infection like acyclovir, adefovir, amphotericin B, bacitracin, cidofovir, foscarnet, ganciclovir, gentamicin, pentamidine, vancomycin NSAIDs, medicines for pain and inflammation, like ibuprofen or naproxen other medicines that prolong the QT interval (an abnormal heart rhythm) pamidronate zoledronic acid This list may not describe all possible interactions. Give your health care provider a list of all the medicines, herbs, non-prescription drugs, or dietary supplements you use. Also tell them if you smoke, drink  alcohol, or use illegal drugs. Some items may interact with your medicine. What should I watch for while using this medication? Your condition will be monitored carefully while you are receiving this medicine. You may need blood work done while you are taking this medicine. This medicine may make you feel generally unwell. This is not uncommon as chemotherapy can affect healthy cells as well as cancer cells. Report any side effects. Continue your course of treatment even though you feel ill unless your healthcare professional tells you to stop. This medicine can make you more sensitive to cold. Do not drink cold drinks or use ice. Cover exposed skin before coming in contact with cold temperatures or cold objects. When out in cold weather wear warm clothing and cover your mouth and nose to warm the air that goes into your lungs. Tell your doctor if you get sensitive to the cold. Do not become pregnant while taking this medicine or for 9 months after stopping it. Women should inform their health care professional if they wish to become pregnant or think they might be pregnant. Men should not father a child while taking this medicine and for 6 months after stopping it. There is potential for serious side effects to an unborn child. Talk to your health care professional for more information. Do not breast-feed a child while taking this medicine or for 3 months after stopping it. This medicine has caused ovarian failure in some women.  This medicine may make it more difficult to get pregnant. Talk to your health care professional if you are concerned about your fertility. This medicine has caused decreased sperm counts in some men. This may make it more difficult to father a child. Talk to your health care professional if you are concerned about your fertility. This medicine may increase your risk of getting an infection. Call your health care professional for advice if you get a fever, chills, or sore throat,  or other symptoms of a cold or flu. Do not treat yourself. Try to avoid being around people who are sick. Avoid taking medicines that contain aspirin, acetaminophen, ibuprofen, naproxen, or ketoprofen unless instructed by your health care professional. These medicines may hide a fever. Be careful brushing or flossing your teeth or using a toothpick because you may get an infection or bleed more easily. If you have any dental work done, tell your dentist you are receiving this medicine. What side effects may I notice from receiving this medication? Side effects that you should report to your doctor or health care professional as soon as possible: allergic reactions like skin rash, itching or hives, swelling of the face, lips, or tongue breathing problems cough low blood counts - this medicine may decrease the number of white blood cells, red blood cells, and platelets. You may be at increased risk for infections and bleeding nausea, vomiting pain, redness, or irritation at site where injected pain, tingling, numbness in the hands or feet signs and symptoms of bleeding such as bloody or black, tarry stools; red or dark brown urine; spitting up blood or brown material that looks like coffee grounds; red spots on the skin; unusual bruising or bleeding from the eyes, gums, or nose signs and symptoms of a dangerous change in heartbeat or heart rhythm like chest pain; dizziness; fast, irregular heartbeat; palpitations; feeling faint or lightheaded; falls signs and symptoms of infection like fever; chills; cough; sore throat; pain or trouble passing urine signs and symptoms of liver injury like dark yellow or brown urine; general ill feeling or flu-like symptoms; light-colored stools; loss of appetite; nausea; right upper belly pain; unusually weak or tired; yellowing of the eyes or skin signs and symptoms of low red blood cells or anemia such as unusually weak or tired; feeling faint or lightheaded;  falls signs and symptoms of muscle injury like dark urine; trouble passing urine or change in the amount of urine; unusually weak or tired; muscle pain; back pain Side effects that usually do not require medical attention (report to your doctor or health care professional if they continue or are bothersome): changes in taste diarrhea gas hair loss loss of appetite mouth sores This list may not describe all possible side effects. Call your doctor for medical advice about side effects. You may report side effects to FDA at 1-800-FDA-1088. Where should I keep my medication? This drug is given in a hospital or clinic and will not be stored at home. NOTE: This sheet is a summary. It may not cover all possible information. If you have questions about this medicine, talk to your doctor, pharmacist, or health care provider.  2022 Elsevier/Gold Standard (2018-10-28 12:20:35)  Leucovorin injection What is this medication? LEUCOVORIN (loo koe VOR in) is used to prevent or treat the harmful effects of some medicines. This medicine is used to treat anemia caused by a low amount of folic acid in the body. It is also used with 5-fluorouracil (5-FU) to treat colon cancer. This  medicine may be used for other purposes; ask your health care provider or pharmacist if you have questions. What should I tell my care team before I take this medication? They need to know if you have any of these conditions: anemia from low levels of vitamin B-12 in the blood an unusual or allergic reaction to leucovorin, folic acid, other medicines, foods, dyes, or preservatives pregnant or trying to get pregnant breast-feeding How should I use this medication? This medicine is for injection into a muscle or into a vein. It is given by a health care professional in a hospital or clinic setting. Talk to your pediatrician regarding the use of this medicine in children. Special care may be needed. Overdosage: If you think you have  taken too much of this medicine contact a poison control center or emergency room at once. NOTE: This medicine is only for you. Do not share this medicine with others. What if I miss a dose? This does not apply. What may interact with this medication? capecitabine fluorouracil phenobarbital phenytoin primidone trimethoprim-sulfamethoxazole This list may not describe all possible interactions. Give your health care provider a list of all the medicines, herbs, non-prescription drugs, or dietary supplements you use. Also tell them if you smoke, drink alcohol, or use illegal drugs. Some items may interact with your medicine. What should I watch for while using this medication? Your condition will be monitored carefully while you are receiving this medicine. This medicine may increase the side effects of 5-fluorouracil, 5-FU. Tell your doctor or health care professional if you have diarrhea or mouth sores that do not get better or that get worse. What side effects may I notice from receiving this medication? Side effects that you should report to your doctor or health care professional as soon as possible: allergic reactions like skin rash, itching or hives, swelling of the face, lips, or tongue breathing problems fever, infection mouth sores unusual bleeding or bruising unusually weak or tired Side effects that usually do not require medical attention (report to your doctor or health care professional if they continue or are bothersome): constipation or diarrhea loss of appetite nausea, vomiting This list may not describe all possible side effects. Call your doctor for medical advice about side effects. You may report side effects to FDA at 1-800-FDA-1088. Where should I keep my medication? This drug is given in a hospital or clinic and will not be stored at home. NOTE: This sheet is a summary. It may not cover all possible information. If you have questions about this medicine, talk to  your doctor, pharmacist, or health care provider.  2022 Elsevier/Gold Standard (2007-12-15 16:50:29)  Fluorouracil, 5-FU injection What is this medication? FLUOROURACIL, 5-FU (flure oh YOOR a sil) is a chemotherapy drug. It slows the growth of cancer cells. This medicine is used to treat many types of cancer like breast cancer, colon or rectal cancer, pancreatic cancer, and stomach cancer. This medicine may be used for other purposes; ask your health care provider or pharmacist if you have questions. COMMON BRAND NAME(S): Adrucil What should I tell my care team before I take this medication? They need to know if you have any of these conditions: blood disorders dihydropyrimidine dehydrogenase (DPD) deficiency infection (especially a virus infection such as chickenpox, cold sores, or herpes) kidney disease liver disease malnourished, poor nutrition recent or ongoing radiation therapy an unusual or allergic reaction to fluorouracil, other chemotherapy, other medicines, foods, dyes, or preservatives pregnant or trying to get pregnant breast-feeding How  should I use this medication? This drug is given as an infusion or injection into a vein. It is administered in a hospital or clinic by a specially trained health care professional. Talk to your pediatrician regarding the use of this medicine in children. Special care may be needed. Overdosage: If you think you have taken too much of this medicine contact a poison control center or emergency room at once. NOTE: This medicine is only for you. Do not share this medicine with others. What if I miss a dose? It is important not to miss your dose. Call your doctor or health care professional if you are unable to keep an appointment. What may interact with this medication? Do not take this medicine with any of the following medications: live virus vaccines This medicine may also interact with the following medications: medicines that treat or  prevent blood clots like warfarin, enoxaparin, and dalteparin This list may not describe all possible interactions. Give your health care provider a list of all the medicines, herbs, non-prescription drugs, or dietary supplements you use. Also tell them if you smoke, drink alcohol, or use illegal drugs. Some items may interact with your medicine. What should I watch for while using this medication? Visit your doctor for checks on your progress. This drug may make you feel generally unwell. This is not uncommon, as chemotherapy can affect healthy cells as well as cancer cells. Report any side effects. Continue your course of treatment even though you feel ill unless your doctor tells you to stop. In some cases, you may be given additional medicines to help with side effects. Follow all directions for their use. Call your doctor or health care professional for advice if you get a fever, chills or sore throat, or other symptoms of a cold or flu. Do not treat yourself. This drug decreases your body's ability to fight infections. Try to avoid being around people who are sick. This medicine may increase your risk to bruise or bleed. Call your doctor or health care professional if you notice any unusual bleeding. Be careful brushing and flossing your teeth or using a toothpick because you may get an infection or bleed more easily. If you have any dental work done, tell your dentist you are receiving this medicine. Avoid taking products that contain aspirin, acetaminophen, ibuprofen, naproxen, or ketoprofen unless instructed by your doctor. These medicines may hide a fever. Do not become pregnant while taking this medicine. Women should inform their doctor if they wish to become pregnant or think they might be pregnant. There is a potential for serious side effects to an unborn child. Talk to your health care professional or pharmacist for more information. Do not breast-feed an infant while taking this  medicine. Men should inform their doctor if they wish to father a child. This medicine may lower sperm counts. Do not treat diarrhea with over the counter products. Contact your doctor if you have diarrhea that lasts more than 2 days or if it is severe and watery. This medicine can make you more sensitive to the sun. Keep out of the sun. If you cannot avoid being in the sun, wear protective clothing and use sunscreen. Do not use sun lamps or tanning beds/booths. What side effects may I notice from receiving this medication? Side effects that you should report to your doctor or health care professional as soon as possible: allergic reactions like skin rash, itching or hives, swelling of the face, lips, or tongue low blood counts -  this medicine may decrease the number of white blood cells, red blood cells and platelets. You may be at increased risk for infections and bleeding. signs of infection - fever or chills, cough, sore throat, pain or difficulty passing urine signs of decreased platelets or bleeding - bruising, pinpoint red spots on the skin, black, tarry stools, blood in the urine signs of decreased red blood cells - unusually weak or tired, fainting spells, lightheadedness breathing problems changes in vision chest pain mouth sores nausea and vomiting pain, swelling, redness at site where injected pain, tingling, numbness in the hands or feet redness, swelling, or sores on hands or feet stomach pain unusual bleeding Side effects that usually do not require medical attention (report to your doctor or health care professional if they continue or are bothersome): changes in finger or toe nails diarrhea dry or itchy skin hair loss headache loss of appetite sensitivity of eyes to the light stomach upset unusually teary eyes This list may not describe all possible side effects. Call your doctor for medical advice about side effects. You may report side effects to FDA at  1-800-FDA-1088. Where should I keep my medication? This drug is given in a hospital or clinic and will not be stored at home. NOTE: This sheet is a summary. It may not cover all possible information. If you have questions about this medicine, talk to your doctor, pharmacist, or health care provider.  2022 Elsevier/Gold Standard (2019-05-11 15:00:03)

## 2021-03-06 NOTE — Progress Notes (Signed)
Patient presents for treatment. RN assessment completed along with the following:  Treatment Conditions (labs/vitals/weight) reviewed - Yes, and within treatment parameters.   Oncology Treatment Attestation completed for current therapy- Yes, on date 03/02/21 Informed consent completed and reflects current therapy/intent - Yes, on date 03/06/21             Provider progress note reviewed - Patient not seen by provider today. Most recent note dated 03/02/21 reviewed. Treatment/Antibody/Supportive plan reviewed "Yes, and there are no adjustments needed for today's treatment."} S&H and other orders reviewed - Yes, and there are no additional orders identified. Previous treatment date reviewed - Yes, and the appropriate amount of time has elapsed between treatments.   Patient to proceed with treatment.

## 2021-03-07 ENCOUNTER — Telehealth: Payer: Self-pay

## 2021-03-07 NOTE — Telephone Encounter (Signed)
24 hr call back done. left message to cal back with any questions or concerns.

## 2021-03-08 ENCOUNTER — Other Ambulatory Visit: Payer: Self-pay

## 2021-03-08 ENCOUNTER — Telehealth: Payer: Self-pay | Admitting: Oncology

## 2021-03-08 ENCOUNTER — Inpatient Hospital Stay: Payer: Medicare Other

## 2021-03-08 VITALS — BP 90/60 | HR 97 | Temp 98.0°F | Resp 20

## 2021-03-08 DIAGNOSIS — Z5111 Encounter for antineoplastic chemotherapy: Secondary | ICD-10-CM | POA: Diagnosis not present

## 2021-03-08 DIAGNOSIS — C221 Intrahepatic bile duct carcinoma: Secondary | ICD-10-CM

## 2021-03-08 MED ORDER — SODIUM CHLORIDE 0.9% FLUSH
10.0000 mL | INTRAVENOUS | Status: DC | PRN
  Administered 2021-03-08: 10 mL

## 2021-03-08 MED ORDER — HEPARIN SOD (PORK) LOCK FLUSH 100 UNIT/ML IV SOLN
500.0000 [IU] | Freq: Once | INTRAVENOUS | Status: AC | PRN
  Administered 2021-03-08: 500 [IU]

## 2021-03-08 NOTE — Telephone Encounter (Signed)
Patient was due in at 1:30 for a pump D/C.  I called and LMVM for him to call us back if anything was wrong

## 2021-03-09 ENCOUNTER — Inpatient Hospital Stay: Payer: Medicare Other

## 2021-03-09 ENCOUNTER — Inpatient Hospital Stay: Payer: Medicare Other | Admitting: Oncology

## 2021-03-12 ENCOUNTER — Encounter: Payer: Self-pay | Admitting: Oncology

## 2021-03-16 ENCOUNTER — Telehealth: Payer: Self-pay

## 2021-03-16 ENCOUNTER — Other Ambulatory Visit: Payer: Self-pay | Admitting: Nurse Practitioner

## 2021-03-16 DIAGNOSIS — C221 Intrahepatic bile duct carcinoma: Secondary | ICD-10-CM

## 2021-03-16 MED ORDER — HYDROCODONE-ACETAMINOPHEN 5-325 MG PO TABS: 0.5000 | ORAL_TABLET | Freq: Four times a day (QID) | ORAL | 0 refills | Status: DC | PRN

## 2021-03-16 NOTE — Telephone Encounter (Signed)
TC from Pt requesting medication refill on [facility] 5-325 request given to provider.

## 2021-03-18 ENCOUNTER — Other Ambulatory Visit: Payer: Self-pay | Admitting: Oncology

## 2021-03-21 ENCOUNTER — Inpatient Hospital Stay: Payer: Medicare Other

## 2021-03-21 ENCOUNTER — Other Ambulatory Visit: Payer: Self-pay | Admitting: *Deleted

## 2021-03-21 ENCOUNTER — Other Ambulatory Visit: Payer: Self-pay

## 2021-03-21 ENCOUNTER — Telehealth: Payer: Self-pay

## 2021-03-21 ENCOUNTER — Inpatient Hospital Stay (HOSPITAL_BASED_OUTPATIENT_CLINIC_OR_DEPARTMENT_OTHER): Payer: Medicare Other | Admitting: Oncology

## 2021-03-21 VITALS — BP 98/69 | HR 100 | Temp 98.7°F | Resp 18 | Ht 68.0 in | Wt 166.8 lb

## 2021-03-21 DIAGNOSIS — C221 Intrahepatic bile duct carcinoma: Secondary | ICD-10-CM | POA: Diagnosis not present

## 2021-03-21 DIAGNOSIS — Z5111 Encounter for antineoplastic chemotherapy: Secondary | ICD-10-CM | POA: Diagnosis not present

## 2021-03-21 LAB — CBC WITH DIFFERENTIAL (CANCER CENTER ONLY)
Abs Immature Granulocytes: 0.01 10*3/uL (ref 0.00–0.07)
Basophils Absolute: 0.1 10*3/uL (ref 0.0–0.1)
Basophils Relative: 1 %
Eosinophils Absolute: 0.3 10*3/uL (ref 0.0–0.5)
Eosinophils Relative: 9 %
HCT: 28.8 % — ABNORMAL LOW (ref 39.0–52.0)
Hemoglobin: 9.7 g/dL — ABNORMAL LOW (ref 13.0–17.0)
Immature Granulocytes: 0 %
Lymphocytes Relative: 21 %
Lymphs Abs: 0.7 10*3/uL (ref 0.7–4.0)
MCH: 32.7 pg (ref 26.0–34.0)
MCHC: 33.7 g/dL (ref 30.0–36.0)
MCV: 97 fL (ref 80.0–100.0)
Monocytes Absolute: 0.5 10*3/uL (ref 0.1–1.0)
Monocytes Relative: 15 %
Neutro Abs: 1.9 10*3/uL (ref 1.7–7.7)
Neutrophils Relative %: 54 %
Platelet Count: 175 10*3/uL (ref 150–400)
RBC: 2.97 MIL/uL — ABNORMAL LOW (ref 4.22–5.81)
RDW: 16.8 % — ABNORMAL HIGH (ref 11.5–15.5)
WBC Count: 3.6 10*3/uL — ABNORMAL LOW (ref 4.0–10.5)
nRBC: 0 % (ref 0.0–0.2)

## 2021-03-21 LAB — CMP (CANCER CENTER ONLY)
ALT: 23 U/L (ref 0–44)
AST: 26 U/L (ref 15–41)
Albumin: 3.9 g/dL (ref 3.5–5.0)
Alkaline Phosphatase: 102 U/L (ref 38–126)
Anion gap: 12 (ref 5–15)
BUN: 34 mg/dL — ABNORMAL HIGH (ref 8–23)
CO2: 22 mmol/L (ref 22–32)
Calcium: 9.6 mg/dL (ref 8.9–10.3)
Chloride: 100 mmol/L (ref 98–111)
Creatinine: 1.09 mg/dL (ref 0.61–1.24)
GFR, Estimated: >60 mL/min (ref >60)
Glucose, Bld: 200 mg/dL — ABNORMAL HIGH (ref 70–99)
Potassium: 4.5 mmol/L (ref 3.5–5.1)
Sodium: 134 mmol/L — ABNORMAL LOW (ref 135–145)
Total Bilirubin: 0.4 mg/dL (ref 0.3–1.2)
Total Protein: 6.9 g/dL (ref 6.5–8.1)

## 2021-03-21 MED ORDER — OXALIPLATIN CHEMO INJECTION 100 MG/20ML
85.0000 mg/m2 | Freq: Once | INTRAVENOUS | Status: AC
  Administered 2021-03-21: 165 mg via INTRAVENOUS
  Filled 2021-03-21: qty 33

## 2021-03-21 MED ORDER — SODIUM CHLORIDE 0.9 % IV SOLN
5000.0000 mg | INTRAVENOUS | Status: DC
  Administered 2021-03-21: 5000 mg via INTRAVENOUS
  Filled 2021-03-21: qty 100

## 2021-03-21 MED ORDER — FLUOROURACIL CHEMO INJECTION 2.5 GM/50ML
400.0000 mg/m2 | Freq: Once | INTRAVENOUS | Status: AC
  Administered 2021-03-21: 750 mg via INTRAVENOUS
  Filled 2021-03-21: qty 15

## 2021-03-21 MED ORDER — LEUCOVORIN CALCIUM INJECTION 350 MG
400.0000 mg/m2 | Freq: Once | INTRAVENOUS | Status: AC
  Administered 2021-03-21: 768 mg via INTRAVENOUS
  Filled 2021-03-21: qty 38.4

## 2021-03-21 MED ORDER — DEXTROSE 5 % IV SOLN: Freq: Once | INTRAVENOUS | Status: AC

## 2021-03-21 MED ORDER — PALONOSETRON HCL INJECTION 0.25 MG/5ML
0.2500 mg | Freq: Once | INTRAVENOUS | Status: AC
  Administered 2021-03-21: 0.25 mg via INTRAVENOUS
  Filled 2021-03-21: qty 5

## 2021-03-21 MED ORDER — SODIUM CHLORIDE 0.9 % IV SOLN
10.0000 mg | Freq: Once | INTRAVENOUS | Status: AC
  Administered 2021-03-21: 10 mg via INTRAVENOUS
  Filled 2021-03-21: qty 1

## 2021-03-21 NOTE — Progress Notes (Signed)
  [facility] OFFICE PROGRESS NOTE   Diagnosis: Further cholangiocarcinoma  INTERVAL HISTORY:   Erik Fletcher completed a cycle of FOLFOX on 03/06/2021.  He had an episode of dry heaves during the week following chemotherapy.  He had cold sensitivity when drinking on day 3.  No neuropathy symptoms at present.  He has pain in the right abdomen.  He does not take pain medication frequently.  Objective:  Vital signs in last 24 hours:  Blood pressure 98/69, pulse 100, temperature 98.7 F (37.1 C), temperature source Oral, resp. rate 18, height $RemoveBe'5\' 8"'BPCtOpwCf$  (1.727 m), weight 166 lb 12.8 oz (75.7 kg), SpO2 98 %.    HEENT: No thrush or ulcers Resp: Lung clear bilaterally Cardio: Regular rate and rhythm GI: The liver is palpable in the right upper abdomen Vascular: No leg edema  Skin: Palms without erythema  Portacath/PICC-without erythema  Lab Results:  Lab Results  Component Value Date   WBC 3.6 (L) 03/21/2021   HGB 9.7 (L) 03/21/2021   HCT 28.8 (L) 03/21/2021   MCV 97.0 03/21/2021   PLT 175 03/21/2021   NEUTROABS 1.9 03/21/2021    CMP  Lab Results  Component Value Date   NA 133 (L) 03/02/2021   K 4.7 03/02/2021   CL 97 (L) 03/02/2021   CO2 25 03/02/2021   GLUCOSE 326 (H) 03/02/2021   BUN 33 (H) 03/02/2021   CREATININE 1.09 03/02/2021   CALCIUM 9.8 03/02/2021   PROT 7.5 03/02/2021   ALBUMIN 3.9 03/02/2021   AST 25 03/02/2021   ALT 20 03/02/2021   ALKPHOS 100 03/02/2021   BILITOT 0.4 03/02/2021   GFRNONAA >60 03/02/2021   GFRAA >60 10/11/2019    Lab Results  Component Value Date   CEA 54.5 (H) 12/01/2020   ZHG992 99,891 (H) 03/02/2021    Lab Results  Component Value Date   INR 1.0 12/11/2020   LABPROT 13.4 12/11/2020    Imaging:  No results found.  Medications: I have reviewed the patient's current medications.   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 Cycle 1 FOLFOX 03/06/2021 Cycle 2 FOLFOX 03/21/2021 Glaucoma Anorexia/weight loss secondary to #1 Pain secondary to #1 Port-A-Cath placement 12/27/2020    Disposition: Erik Fletcher appears stable.  He will complete another cycle of FOLFOX today.  He will return for an office visit and chemotherapy in 2 weeks.  Then plan is to complete 4 or 5 cycles of FOLFOX prior to a restaging evaluation.  We will check the CA 19-9 after cycle 3.  Betsy Coder, MD  03/21/2021  9:03 AM

## 2021-03-21 NOTE — Patient Instructions (Signed)
[facility]  Discharge Instructions: Thank you for choosing [facility] to provide your oncology and hematology care.   If you have a lab appointment with the [facility], please go directly to the [facility] and check in at the registration area.   Wear comfortable clothing and clothing appropriate for easy access to any Portacath or PICC line.   We strive to give you quality time with your provider. You may need to reschedule your appointment if you arrive late (15 or more minutes).  Arriving late affects you and other patients whose appointments are after yours.  Also, if you miss three or more appointments without notifying the office, you may be dismissed from the clinic at the provider's discretion.      For prescription refill requests, have your pharmacy contact our office and allow 72 hours for refills to be completed.    Today you received the following chemotherapy and/or immunotherapy agents OXALAPLATIN, LEUCOVORIN, 5 FU      To help prevent nausea and vomiting after your treatment, we encourage you to take your nausea medication as directed.  BELOW ARE SYMPTOMS THAT SHOULD BE REPORTED IMMEDIATELY: *FEVER GREATER THAN 100.4 F (38 C) OR HIGHER *CHILLS OR SWEATING *NAUSEA AND VOMITING THAT IS NOT CONTROLLED WITH YOUR NAUSEA MEDICATION *UNUSUAL SHORTNESS OF BREATH *UNUSUAL BRUISING OR BLEEDING *URINARY PROBLEMS (pain or burning when urinating, or frequent urination) *BOWEL PROBLEMS (unusual diarrhea, constipation, pain near the anus) TENDERNESS IN MOUTH AND THROAT WITH OR WITHOUT PRESENCE OF ULCERS (sore throat, sores in mouth, or a toothache) UNUSUAL RASH, SWELLING OR PAIN  UNUSUAL VAGINAL DISCHARGE OR ITCHING   Items with * indicate a potential emergency and should be followed up as soon as possible or go to the Emergency Department if any problems should occur.  Please show the CHEMOTHERAPY ALERT CARD or IMMUNOTHERAPY ALERT CARD  at check-in to the Emergency Department and triage nurse.  Should you have questions after your visit or need to cancel or reschedule your appointment, please contact [facility]  Dept: [phone]  and follow the prompts.  Office hours are 8:00 a.m. to 4:30 p.m. Monday - Friday. Please note that voicemails left after 4:00 p.m. may not be returned until the following business day.  We are closed weekends and major holidays. You have access to a nurse at all times for urgent questions. Please call the main number to the clinic Dept: [phone] and follow the prompts.   For any non-urgent questions, you may also contact your provider using MyChart. We now offer e-Visits for anyone 21 and older to request care online for non-urgent symptoms. For details visit [email or web address].   Also download the MyChart app! Go to the app store, search "MyChart", open the app, select [facility], and log in with your MyChart username and password.  Due to Covid, a mask is required upon entering the hospital/clinic. If you do not have a mask, one will be given to you upon arrival. For doctor visits, patients may have 1 support person aged 58 or older with them. For treatment visits, patients cannot have anyone with them due to current Covid guidelines and our immunocompromised population.    Oxaliplatin Injection What is this medication? OXALIPLATIN (ox AL i PLA tin) is a chemotherapy drug. It targets fast dividing cells, like cancer cells, and causes these cells to die. This medicine is used to treat cancers of the colon and rectum, and many other  cancers. This medicine may be used for other purposes; ask your health care provider or pharmacist if you have questions. COMMON BRAND NAME(S): Eloxatin What should I tell my care team before I take this medication? They need to know if you have any of these conditions: heart disease history of irregular heartbeat liver disease low  blood counts, like white cells, platelets, or red blood cells lung or breathing disease, like asthma take medicines that treat or prevent blood clots tingling of the fingers or toes, or other nerve disorder an unusual or allergic reaction to oxaliplatin, other chemotherapy, other medicines, foods, dyes, or preservatives pregnant or trying to get pregnant breast-feeding How should I use this medication? This drug is given as an infusion into a vein. It is administered in a hospital or clinic by a specially trained health care professional. Talk to your pediatrician regarding the use of this medicine in children. Special care may be needed. Overdosage: If you think you have taken too much of this medicine contact a poison control center or emergency room at once. NOTE: This medicine is only for you. Do not share this medicine with others. What if I miss a dose? It is important not to miss a dose. Call your doctor or health care professional if you are unable to keep an appointment. What may interact with this medication? Do not take this medicine with any of the following medications: cisapride dronedarone pimozide thioridazine This medicine may also interact with the following medications: aspirin and aspirin-like medicines certain medicines that treat or prevent blood clots like warfarin, apixaban, dabigatran, and rivaroxaban cisplatin cyclosporine diuretics medicines for infection like acyclovir, adefovir, amphotericin B, bacitracin, cidofovir, foscarnet, ganciclovir, gentamicin, pentamidine, vancomycin NSAIDs, medicines for pain and inflammation, like ibuprofen or naproxen other medicines that prolong the QT interval (an abnormal heart rhythm) pamidronate zoledronic acid This list may not describe all possible interactions. Give your health care provider a list of all the medicines, herbs, non-prescription drugs, or dietary supplements you use. Also tell them if you smoke, drink  alcohol, or use illegal drugs. Some items may interact with your medicine. What should I watch for while using this medication? Your condition will be monitored carefully while you are receiving this medicine. You may need blood work done while you are taking this medicine. This medicine may make you feel generally unwell. This is not uncommon as chemotherapy can affect healthy cells as well as cancer cells. Report any side effects. Continue your course of treatment even though you feel ill unless your healthcare professional tells you to stop. This medicine can make you more sensitive to cold. Do not drink cold drinks or use ice. Cover exposed skin before coming in contact with cold temperatures or cold objects. When out in cold weather wear warm clothing and cover your mouth and nose to warm the air that goes into your lungs. Tell your doctor if you get sensitive to the cold. Do not become pregnant while taking this medicine or for 9 months after stopping it. Women should inform their health care professional if they wish to become pregnant or think they might be pregnant. Men should not father a child while taking this medicine and for 6 months after stopping it. There is potential for serious side effects to an unborn child. Talk to your health care professional for more information. Do not breast-feed a child while taking this medicine or for 3 months after stopping it. This medicine has caused ovarian failure in some women.  This medicine may make it more difficult to get pregnant. Talk to your health care professional if you are concerned about your fertility. This medicine has caused decreased sperm counts in some men. This may make it more difficult to father a child. Talk to your health care professional if you are concerned about your fertility. This medicine may increase your risk of getting an infection. Call your health care professional for advice if you get a fever, chills, or sore throat,  or other symptoms of a cold or flu. Do not treat yourself. Try to avoid being around people who are sick. Avoid taking medicines that contain aspirin, acetaminophen, ibuprofen, naproxen, or ketoprofen unless instructed by your health care professional. These medicines may hide a fever. Be careful brushing or flossing your teeth or using a toothpick because you may get an infection or bleed more easily. If you have any dental work done, tell your dentist you are receiving this medicine. What side effects may I notice from receiving this medication? Side effects that you should report to your doctor or health care professional as soon as possible: allergic reactions like skin rash, itching or hives, swelling of the face, lips, or tongue breathing problems cough low blood counts - this medicine may decrease the number of white blood cells, red blood cells, and platelets. You may be at increased risk for infections and bleeding nausea, vomiting pain, redness, or irritation at site where injected pain, tingling, numbness in the hands or feet signs and symptoms of bleeding such as bloody or black, tarry stools; red or dark brown urine; spitting up blood or brown material that looks like coffee grounds; red spots on the skin; unusual bruising or bleeding from the eyes, gums, or nose signs and symptoms of a dangerous change in heartbeat or heart rhythm like chest pain; dizziness; fast, irregular heartbeat; palpitations; feeling faint or lightheaded; falls signs and symptoms of infection like fever; chills; cough; sore throat; pain or trouble passing urine signs and symptoms of liver injury like dark yellow or brown urine; general ill feeling or flu-like symptoms; light-colored stools; loss of appetite; nausea; right upper belly pain; unusually weak or tired; yellowing of the eyes or skin signs and symptoms of low red blood cells or anemia such as unusually weak or tired; feeling faint or lightheaded;  falls signs and symptoms of muscle injury like dark urine; trouble passing urine or change in the amount of urine; unusually weak or tired; muscle pain; back pain Side effects that usually do not require medical attention (report to your doctor or health care professional if they continue or are bothersome): changes in taste diarrhea gas hair loss loss of appetite mouth sores This list may not describe all possible side effects. Call your doctor for medical advice about side effects. You may report side effects to FDA at 1-800-FDA-1088. Where should I keep my medication? This drug is given in a hospital or clinic and will not be stored at home. NOTE: This sheet is a summary. It may not cover all possible information. If you have questions about this medicine, talk to your doctor, pharmacist, or health care provider.  2022 Elsevier/Gold Standard (2018-10-28 12:20:35)  Leucovorin injection What is this medication? LEUCOVORIN (loo koe VOR in) is used to prevent or treat the harmful effects of some medicines. This medicine is used to treat anemia caused by a low amount of folic acid in the body. It is also used with 5-fluorouracil (5-FU) to treat colon cancer. This  medicine may be used for other purposes; ask your health care provider or pharmacist if you have questions. What should I tell my care team before I take this medication? They need to know if you have any of these conditions: anemia from low levels of vitamin B-12 in the blood an unusual or allergic reaction to leucovorin, folic acid, other medicines, foods, dyes, or preservatives pregnant or trying to get pregnant breast-feeding How should I use this medication? This medicine is for injection into a muscle or into a vein. It is given by a health care professional in a hospital or clinic setting. Talk to your pediatrician regarding the use of this medicine in children. Special care may be needed. Overdosage: If you think you have  taken too much of this medicine contact a poison control center or emergency room at once. NOTE: This medicine is only for you. Do not share this medicine with others. What if I miss a dose? This does not apply. What may interact with this medication? capecitabine fluorouracil phenobarbital phenytoin primidone trimethoprim-sulfamethoxazole This list may not describe all possible interactions. Give your health care provider a list of all the medicines, herbs, non-prescription drugs, or dietary supplements you use. Also tell them if you smoke, drink alcohol, or use illegal drugs. Some items may interact with your medicine. What should I watch for while using this medication? Your condition will be monitored carefully while you are receiving this medicine. This medicine may increase the side effects of 5-fluorouracil, 5-FU. Tell your doctor or health care professional if you have diarrhea or mouth sores that do not get better or that get worse. What side effects may I notice from receiving this medication? Side effects that you should report to your doctor or health care professional as soon as possible: allergic reactions like skin rash, itching or hives, swelling of the face, lips, or tongue breathing problems fever, infection mouth sores unusual bleeding or bruising unusually weak or tired Side effects that usually do not require medical attention (report to your doctor or health care professional if they continue or are bothersome): constipation or diarrhea loss of appetite nausea, vomiting This list may not describe all possible side effects. Call your doctor for medical advice about side effects. You may report side effects to FDA at 1-800-FDA-1088. Where should I keep my medication? This drug is given in a hospital or clinic and will not be stored at home. NOTE: This sheet is a summary. It may not cover all possible information. If you have questions about this medicine, talk to  your doctor, pharmacist, or health care provider.  2022 Elsevier/Gold Standard (2007-12-15 16:50:29)  Fluorouracil, 5-FU injection What is this medication? FLUOROURACIL, 5-FU (flure oh YOOR a sil) is a chemotherapy drug. It slows the growth of cancer cells. This medicine is used to treat many types of cancer like breast cancer, colon or rectal cancer, pancreatic cancer, and stomach cancer. This medicine may be used for other purposes; ask your health care provider or pharmacist if you have questions. COMMON BRAND NAME(S): Adrucil What should I tell my care team before I take this medication? They need to know if you have any of these conditions: blood disorders dihydropyrimidine dehydrogenase (DPD) deficiency infection (especially a virus infection such as chickenpox, cold sores, or herpes) kidney disease liver disease malnourished, poor nutrition recent or ongoing radiation therapy an unusual or allergic reaction to fluorouracil, other chemotherapy, other medicines, foods, dyes, or preservatives pregnant or trying to get pregnant breast-feeding How  should I use this medication? This drug is given as an infusion or injection into a vein. It is administered in a hospital or clinic by a specially trained health care professional. Talk to your pediatrician regarding the use of this medicine in children. Special care may be needed. Overdosage: If you think you have taken too much of this medicine contact a poison control center or emergency room at once. NOTE: This medicine is only for you. Do not share this medicine with others. What if I miss a dose? It is important not to miss your dose. Call your doctor or health care professional if you are unable to keep an appointment. What may interact with this medication? Do not take this medicine with any of the following medications: live virus vaccines This medicine may also interact with the following medications: medicines that treat or  prevent blood clots like warfarin, enoxaparin, and dalteparin This list may not describe all possible interactions. Give your health care provider a list of all the medicines, herbs, non-prescription drugs, or dietary supplements you use. Also tell them if you smoke, drink alcohol, or use illegal drugs. Some items may interact with your medicine. What should I watch for while using this medication? Visit your doctor for checks on your progress. This drug may make you feel generally unwell. This is not uncommon, as chemotherapy can affect healthy cells as well as cancer cells. Report any side effects. Continue your course of treatment even though you feel ill unless your doctor tells you to stop. In some cases, you may be given additional medicines to help with side effects. Follow all directions for their use. Call your doctor or health care professional for advice if you get a fever, chills or sore throat, or other symptoms of a cold or flu. Do not treat yourself. This drug decreases your body's ability to fight infections. Try to avoid being around people who are sick. This medicine may increase your risk to bruise or bleed. Call your doctor or health care professional if you notice any unusual bleeding. Be careful brushing and flossing your teeth or using a toothpick because you may get an infection or bleed more easily. If you have any dental work done, tell your dentist you are receiving this medicine. Avoid taking products that contain aspirin, acetaminophen, ibuprofen, naproxen, or ketoprofen unless instructed by your doctor. These medicines may hide a fever. Do not become pregnant while taking this medicine. Women should inform their doctor if they wish to become pregnant or think they might be pregnant. There is a potential for serious side effects to an unborn child. Talk to your health care professional or pharmacist for more information. Do not breast-feed an infant while taking this  medicine. Men should inform their doctor if they wish to father a child. This medicine may lower sperm counts. Do not treat diarrhea with over the counter products. Contact your doctor if you have diarrhea that lasts more than 2 days or if it is severe and watery. This medicine can make you more sensitive to the sun. Keep out of the sun. If you cannot avoid being in the sun, wear protective clothing and use sunscreen. Do not use sun lamps or tanning beds/booths. What side effects may I notice from receiving this medication? Side effects that you should report to your doctor or health care professional as soon as possible: allergic reactions like skin rash, itching or hives, swelling of the face, lips, or tongue low blood counts -  this medicine may decrease the number of white blood cells, red blood cells and platelets. You may be at increased risk for infections and bleeding. signs of infection - fever or chills, cough, sore throat, pain or difficulty passing urine signs of decreased platelets or bleeding - bruising, pinpoint red spots on the skin, black, tarry stools, blood in the urine signs of decreased red blood cells - unusually weak or tired, fainting spells, lightheadedness breathing problems changes in vision chest pain mouth sores nausea and vomiting pain, swelling, redness at site where injected pain, tingling, numbness in the hands or feet redness, swelling, or sores on hands or feet stomach pain unusual bleeding Side effects that usually do not require medical attention (report to your doctor or health care professional if they continue or are bothersome): changes in finger or toe nails diarrhea dry or itchy skin hair loss headache loss of appetite sensitivity of eyes to the light stomach upset unusually teary eyes This list may not describe all possible side effects. Call your doctor for medical advice about side effects. You may report side effects to FDA at  1-800-FDA-1088. Where should I keep my medication? This drug is given in a hospital or clinic and will not be stored at home. NOTE: This sheet is a summary. It may not cover all possible information. If you have questions about this medicine, talk to your doctor, pharmacist, or health care provider.  2022 Elsevier/Gold Standard (2019-05-11 15:00:03)

## 2021-03-21 NOTE — Patient Instructions (Signed)
Implanted Port Home Guide An implanted port is a device that is placed under the skin. It is usually placed in the chest. The device can be used to give IV medicine, to take blood, or for dialysis. You may have an implanted port if: You need IV medicine that would be irritating to the small veins in your hands or arms. You need IV medicines, such as antibiotics, for a long period of time. You need IV nutrition for a long period of time. You need dialysis. When you have a port, your health care provider can choose to use the port instead of veins in your arms for these procedures. You may have fewer limitations when using a port than you would if you used other types of long-term IVs, and you will likely be able to return to normal activities after your incision heals. An implanted port has two main parts: Reservoir. The reservoir is the part where a needle is inserted to give medicines or draw blood. The reservoir is round. After it is placed, it appears as a small, raised area under your skin. Catheter. The catheter is a thin, flexible tube that connects the reservoir to a vein. Medicine that is inserted into the reservoir goes into the catheter and then into the vein. How is my port accessed? To access your port: A numbing cream may be placed on the skin over the port site. Your health care provider will put on a mask and sterile gloves. The skin over your port will be cleaned carefully with a germ-killing soap and allowed to dry. Your health care provider will gently pinch the port and insert a needle into it. Your health care provider will check for a blood return to make sure the port is in the vein and is not clogged. If your port needs to remain accessed to get medicine continuously (constant infusion), your health care provider will place a clear bandage (dressing) over the needle site. The dressing and needle will need to be changed every week, or as told by your health care provider. What  is flushing? Flushing helps keep the port from getting clogged. Follow instructions from your health care provider about how and when to flush the port. Ports are usually flushed with saline solution or a medicine called heparin. The need for flushing will depend on how the port is used: If the port is only used from time to time to give medicines or draw blood, the port may need to be flushed: Before and after medicines have been given. Before and after blood has been drawn. As part of routine maintenance. Flushing may be recommended every 4-6 weeks. If a constant infusion is running, the port may not need to be flushed. Throw away any syringes in a disposal container that is meant for sharp items (sharps container). You can buy a sharps container from a pharmacy, or you can make one by using an empty hard plastic bottle with a cover. How long will my port stay implanted? The port can stay in for as long as your health care provider thinks it is needed. When it is time for the port to come out, a surgery will be done to remove it. The surgery will be similar to the procedure that was done to put the port in. Follow these instructions at home:  Flush your port as told by your health care provider. If you need an infusion over several days, follow instructions from your health care provider about how   to take care of your port site. Make sure you: Wash your hands with soap and water before you change your dressing. If soap and water are not available, use alcohol-based hand sanitizer. Change your dressing as told by your health care provider. Place any used dressings or infusion bags into a plastic bag. Throw that bag in the trash. Keep the dressing that covers the needle clean and dry. Do not get it wet. Do not use scissors or sharp objects near the tube. Keep the tube clamped, unless it is being used. Check your port site every day for signs of infection. Check for: Redness, swelling, or  pain. Fluid or blood. Pus or a bad smell. Protect the skin around the port site. Avoid wearing bra straps that rub or irritate the site. Protect the skin around your port from seat belts. Place a soft pad over your chest if needed. Bathe or shower as told by your health care provider. The site may get wet as long as you are not actively receiving an infusion. Return to your normal activities as told by your health care provider. Ask your health care provider what activities are safe for you. Carry a medical alert card or wear a medical alert bracelet at all times. This will let health care providers know that you have an implanted port in case of an emergency. Get help right away if: You have redness, swelling, or pain at the port site. You have fluid or blood coming from your port site. You have pus or a bad smell coming from the port site. You have a fever. Summary Implanted ports are usually placed in the chest for long-term IV access. Follow instructions from your health care provider about flushing the port and changing bandages (dressings). Take care of the area around your port by avoiding clothing that puts pressure on the area, and by watching for signs of infection. Protect the skin around your port from seat belts. Place a soft pad over your chest if needed. Get help right away if you have a fever or you have redness, swelling, pain, drainage, or a bad smell at the port site. This information is not intended to replace advice given to you by your health care provider. Make sure you discuss any questions you have with your health care provider. Document Revised: 08/30/2020 Document Reviewed: 10/25/2019 Elsevier Patient Education  2022 [employer]  

## 2021-03-21 NOTE — Progress Notes (Signed)
Patient presents for treatment. RN assessment completed along with the following:  Labs/vitals reviewed - Yes, and within treatment parameters.   Weight within 10% of previous measurement - Yes Oncology Treatment Attestation completed for current therapy- Yes, on date 03/02/21 Informed consent completed and reflects current therapy/intent - Yes, on date 03/06/21             Provider progress note reviewed - Yes, today's provider note was reviewed. Treatment/Antibody/Supportive plan reviewed - Yes, and there are no adjustments needed for today's treatment. S&H and other orders reviewed - Yes, and there are no additional orders identified. Previous treatment date reviewed - Yes, and the appropriate amount of time has elapsed between treatments.  Patient to proceed with treatment.

## 2021-03-21 NOTE — Telephone Encounter (Signed)
Patient seen by Dr. Benay Spice today  Vitals are within treatment parameters.  Labs reviewed by Dr. Benay Spice and are not all within treatment parameters.   Ok to treat with Sodium 134 and BUN 34  Per physician team, patient is ready for treatment and there are NO modifications to the treatment plan.

## 2021-03-23 ENCOUNTER — Inpatient Hospital Stay: Payer: Medicare Other

## 2021-03-23 ENCOUNTER — Other Ambulatory Visit: Payer: Self-pay

## 2021-03-23 ENCOUNTER — Other Ambulatory Visit: Payer: Medicare Other

## 2021-03-23 ENCOUNTER — Ambulatory Visit: Payer: Medicare Other | Admitting: Oncology

## 2021-03-23 ENCOUNTER — Ambulatory Visit: Payer: Medicare Other

## 2021-03-23 VITALS — BP 98/68 | HR 73 | Temp 97.8°F | Resp 20

## 2021-03-23 DIAGNOSIS — C221 Intrahepatic bile duct carcinoma: Secondary | ICD-10-CM

## 2021-03-23 DIAGNOSIS — Z5111 Encounter for antineoplastic chemotherapy: Secondary | ICD-10-CM | POA: Diagnosis not present

## 2021-03-23 MED ORDER — SODIUM CHLORIDE 0.9 % IV SOLN: INTRAVENOUS | Status: DC

## 2021-03-23 MED ORDER — HEPARIN SOD (PORK) LOCK FLUSH 100 UNIT/ML IV SOLN
500.0000 [IU] | Freq: Once | INTRAVENOUS | Status: AC | PRN
  Administered 2021-03-23: 500 [IU]

## 2021-03-23 MED ORDER — SODIUM CHLORIDE 0.9% FLUSH
10.0000 mL | INTRAVENOUS | Status: DC | PRN
  Administered 2021-03-23: 10 mL

## 2021-03-23 NOTE — Patient Instructions (Addendum)
[facility]  Discharge Instructions: Thank you for choosing [facility] to provide your oncology and hematology care.   If you have a lab appointment with the [facility], please go directly to the [facility] and check in at the registration area.   Wear comfortable clothing and clothing appropriate for easy access to any Portacath or PICC line.   We strive to give you quality time with your provider. You may need to reschedule your appointment if you arrive late (15 or more minutes).  Arriving late affects you and other patients whose appointments are after yours.  Also, if you miss three or more appointments without notifying the office, you may be dismissed from the clinic at the provider's discretion.      For prescription refill requests, have your pharmacy contact our office and allow 72 hours for refills to be completed.    Today you received the following chemotherapy and/or immunotherapy agents: 69fu  Fluorouracil, 5-FU injection What is this medication? FLUOROURACIL, 5-FU (flure oh YOOR a sil) is a chemotherapy drug. It slows the growth of cancer cells. This medicine is used to treat many types of cancer like breast cancer, colon or rectal cancer, pancreatic cancer, and stomach cancer. This medicine may be used for other purposes; ask your health care provider or pharmacist if you have questions. COMMON BRAND NAME(S): Adrucil What should I tell my care team before I take this medication? They need to know if you have any of these conditions: blood disorders dihydropyrimidine dehydrogenase (DPD) deficiency infection (especially a virus infection such as chickenpox, cold sores, or herpes) kidney disease liver disease malnourished, poor nutrition recent or ongoing radiation therapy an unusual or allergic reaction to fluorouracil, other chemotherapy, other medicines, foods, dyes, or preservatives pregnant or trying to get  pregnant breast-feeding How should I use this medication? This drug is given as an infusion or injection into a vein. It is administered in a hospital or clinic by a specially trained health care professional. Talk to your pediatrician regarding the use of this medicine in children. Special care may be needed. Overdosage: If you think you have taken too much of this medicine contact a poison control center or emergency room at once. NOTE: This medicine is only for you. Do not share this medicine with others. What if I miss a dose? It is important not to miss your dose. Call your doctor or health care professional if you are unable to keep an appointment. What may interact with this medication? Do not take this medicine with any of the following medications: live virus vaccines This medicine may also interact with the following medications: medicines that treat or prevent blood clots like warfarin, enoxaparin, and dalteparin This list may not describe all possible interactions. Give your health care provider a list of all the medicines, herbs, non-prescription drugs, or dietary supplements you use. Also tell them if you smoke, drink alcohol, or use illegal drugs. Some items may interact with your medicine. What should I watch for while using this medication? Visit your doctor for checks on your progress. This drug may make you feel generally unwell. This is not uncommon, as chemotherapy can affect healthy cells as well as cancer cells. Report any side effects. Continue your course of treatment even though you feel ill unless your doctor tells you to stop. In some cases, you may be given additional medicines to help with side effects. Follow all directions for their use. Call your  doctor or health care professional for advice if you get a fever, chills or sore throat, or other symptoms of a cold or flu. Do not treat yourself. This drug decreases your body's ability to fight infections. Try to avoid  being around people who are sick. This medicine may increase your risk to bruise or bleed. Call your doctor or health care professional if you notice any unusual bleeding. Be careful brushing and flossing your teeth or using a toothpick because you may get an infection or bleed more easily. If you have any dental work done, tell your dentist you are receiving this medicine. Avoid taking products that contain aspirin, acetaminophen, ibuprofen, naproxen, or ketoprofen unless instructed by your doctor. These medicines may hide a fever. Do not become pregnant while taking this medicine. Women should inform their doctor if they wish to become pregnant or think they might be pregnant. There is a potential for serious side effects to an unborn child. Talk to your health care professional or pharmacist for more information. Do not breast-feed an infant while taking this medicine. Men should inform their doctor if they wish to father a child. This medicine may lower sperm counts. Do not treat diarrhea with over the counter products. Contact your doctor if you have diarrhea that lasts more than 2 days or if it is severe and watery. This medicine can make you more sensitive to the sun. Keep out of the sun. If you cannot avoid being in the sun, wear protective clothing and use sunscreen. Do not use sun lamps or tanning beds/booths. What side effects may I notice from receiving this medication? Side effects that you should report to your doctor or health care professional as soon as possible: allergic reactions like skin rash, itching or hives, swelling of the face, lips, or tongue low blood counts - this medicine may decrease the number of white blood cells, red blood cells and platelets. You may be at increased risk for infections and bleeding. signs of infection - fever or chills, cough, sore throat, pain or difficulty passing urine signs of decreased platelets or bleeding - bruising, pinpoint red spots on the  skin, black, tarry stools, blood in the urine signs of decreased red blood cells - unusually weak or tired, fainting spells, lightheadedness breathing problems changes in vision chest pain mouth sores nausea and vomiting pain, swelling, redness at site where injected pain, tingling, numbness in the hands or feet redness, swelling, or sores on hands or feet stomach pain unusual bleeding Side effects that usually do not require medical attention (report to your doctor or health care professional if they continue or are bothersome): changes in finger or toe nails diarrhea dry or itchy skin hair loss headache loss of appetite sensitivity of eyes to the light stomach upset unusually teary eyes This list may not describe all possible side effects. Call your doctor for medical advice about side effects. You may report side effects to FDA at 1-800-FDA-1088. Where should I keep my medication? This drug is given in a hospital or clinic and will not be stored at home. NOTE: This sheet is a summary. It may not cover all possible information. If you have questions about this medicine, talk to your doctor, pharmacist, or health care provider.  2022 Elsevier/Gold Standard (2019-05-11 15:00:03)    To help prevent nausea and vomiting after your treatment, we encourage you to take your nausea medication as directed.  BELOW ARE SYMPTOMS THAT SHOULD BE REPORTED IMMEDIATELY: *FEVER GREATER THAN 100.4  F (38 C) OR HIGHER *CHILLS OR SWEATING *NAUSEA AND VOMITING THAT IS NOT CONTROLLED WITH YOUR NAUSEA MEDICATION *UNUSUAL SHORTNESS OF BREATH *UNUSUAL BRUISING OR BLEEDING *URINARY PROBLEMS (pain or burning when urinating, or frequent urination) *BOWEL PROBLEMS (unusual diarrhea, constipation, pain near the anus) TENDERNESS IN MOUTH AND THROAT WITH OR WITHOUT PRESENCE OF ULCERS (sore throat, sores in mouth, or a toothache) UNUSUAL RASH, SWELLING OR PAIN  UNUSUAL VAGINAL DISCHARGE OR ITCHING   Items  with * indicate a potential emergency and should be followed up as soon as possible or go to the Emergency Department if any problems should occur.  Please show the CHEMOTHERAPY ALERT CARD or IMMUNOTHERAPY ALERT CARD at check-in to the Emergency Department and triage nurse.  Should you have questions after your visit or need to cancel or reschedule your appointment, please contact [facility]  Dept: [phone]  and follow the prompts.  Office hours are 8:00 a.m. to 4:30 p.m. Monday - Friday. Please note that voicemails left after 4:00 p.m. may not be returned until the following business day.  We are closed weekends and major holidays. You have access to a nurse at all times for urgent questions. Please call the main number to the clinic Dept: [phone] and follow the prompts.   For any non-urgent questions, you may also contact your provider using MyChart. We now offer e-Visits for anyone 11 and older to request care online for non-urgent symptoms. For details visit [email or web address].   Also download the MyChart app! Go to the app store, search "MyChart", open the app, select Portal, and log in with your MyChart username and password.  Due to Covid, a mask is required upon entering the hospital/clinic. If you do not have a mask, one will be given to you upon arrival. For doctor visits, patients may have 1 support person aged 64 or older with them. For treatment visits, patients cannot have anyone with them due to current Covid guidelines and our immunocompromised population.  Rehydration, Adult Rehydration is the replacement of body fluids, salts, and minerals (electrolytes) that are lost during dehydration. Dehydration is when there is not enough water or other fluids in the body. This happens when you lose more fluids than you take in. Common causes of dehydration include: Not drinking enough fluids. This can occur when you are ill or doing activities that  require a lot of energy, especially in hot weather. Conditions that cause loss of water or other fluids, such as diarrhea, vomiting, sweating, or urinating a lot. Other illnesses, such as fever or infection. Certain medicines, such as those that remove excess fluid from the body (diuretics). Symptoms of mild or moderate dehydration may include thirst, dry lips and mouth, and dizziness. Symptoms of severe dehydration may include increased heart rate, confusion, fainting, and not urinating. For severe dehydration, you may need to get fluids through an IV at the hospital. For mild or moderate dehydration, you can usually rehydrate at home by drinking certain fluids as told by your health care provider. What are the risks? Generally, rehydration is safe. However, taking in too much fluid (overhydration) can be a problem. This is rare. Overhydration can cause an electrolyte imbalance, kidney failure, or a decrease in salt (sodium) levels in the body. Supplies needed You will need an oral rehydration solution (ORS) if your health care provider tells you to use one. This is a drink to treat dehydration. It can be found in pharmacies and retail stores. How to  rehydrate Fluids Follow instructions from your health care provider for rehydration. The kind of fluid and the amount you should drink depend on your condition. In general, you should choose drinks that you prefer. If told by your health care provider, drink an ORS. Make an ORS by following instructions on the package. Start by drinking small amounts, about  cup (120 mL) every 5-10 minutes. Slowly increase how much you drink until you have taken the amount recommended by your health care provider. Drink enough clear fluids to keep your urine pale yellow. If you were told to drink an ORS, finish it first, then start slowly drinking other clear fluids. Drink fluids such as: Water. This includes sparkling water and flavored water. Drinking only water  can lead to having too little sodium in your body (hyponatremia). Follow the advice of your health care provider. Water from ice chips you suck on. Fruit juice with water you add to it (diluted). Sports drinks. Hot or cold herbal teas. Broth-based soups. Milk or milk products. Food Follow instructions from your health care provider about what to eat while you rehydrate. Your health care provider may recommend that you slowly begin eating regular foods in small amounts. Eat foods that contain a healthy balance of electrolytes, such as bananas, oranges, potatoes, tomatoes, and spinach. Avoid foods that are greasy or contain a lot of sugar. In some cases, you may get nutrition through a feeding tube that is passed through your nose and into your stomach (nasogastric tube, or NG tube). This may be done if you have uncontrolled vomiting or diarrhea. Beverages to avoid Certain beverages may make dehydration worse. While you rehydrate, avoid drinking alcohol. How to tell if you are recovering from dehydration You may be recovering from dehydration if: You are urinating more often than before you started rehydrating. Your urine is pale yellow. Your energy level improves. You vomit less frequently. You have diarrhea less frequently. Your appetite improves or returns to normal. You feel less dizzy or less light-headed. Your skin tone and color start to look more normal. Follow these instructions at home: Take over-the-counter and prescription medicines only as told by your health care provider. Do not take sodium tablets. Doing this can lead to having too much sodium in your body (hypernatremia). Contact a health care provider if: You continue to have symptoms of mild or moderate dehydration, such as: Thirst. Dry lips. Slightly dry mouth. Dizziness. Dark urine or less urine than normal. Muscle cramps. You continue to vomit or have diarrhea. Get help right away if you: Have symptoms of  dehydration that get worse. Have a fever. Have a severe headache. Have been vomiting and the following happens: Your vomiting gets worse or does not go away. Your vomit includes blood or green matter (bile). You cannot eat or drink without vomiting. Have problems with urination or bowel movements, such as: Diarrhea that gets worse or does not go away. Blood in your stool (feces). This may cause stool to look black and tarry. Not urinating, or urinating only a small amount of very dark urine, within 6-8 hours. Have trouble breathing. Have symptoms that get worse with treatment. These symptoms may represent a serious problem that is an emergency. Do not wait to see if the symptoms will go away. Get medical help right away. Call your local emergency services (911 in the [place] Do not drive yourself to the hospital. Summary Rehydration is the replacement of body fluids and minerals (electrolytes) that are lost during dehydration.  Follow instructions from your health care provider for rehydration. The kind of fluid and amount you should drink depend on your condition. Slowly increase how much you drink until you have taken the amount recommended by your health care provider. Contact your health care provider if you continue to show signs of mild or moderate dehydration. This information is not intended to replace advice given to you by your health care provider. Make sure you discuss any questions you have with your health care provider. Document Revised: 08/11/2019 Document Reviewed: 06/21/2019 Elsevier Patient Education  2022 [employer].

## 2021-04-01 ENCOUNTER — Other Ambulatory Visit: Payer: Self-pay | Admitting: Oncology

## 2021-04-04 ENCOUNTER — Inpatient Hospital Stay: Payer: Medicare Other

## 2021-04-04 ENCOUNTER — Inpatient Hospital Stay: Payer: Medicare Other | Admitting: Nurse Practitioner

## 2021-04-04 ENCOUNTER — Inpatient Hospital Stay: Payer: Medicare Other | Admitting: Nutrition

## 2021-04-06 ENCOUNTER — Inpatient Hospital Stay: Payer: Medicare Other

## 2021-04-06 ENCOUNTER — Encounter: Payer: Self-pay | Admitting: Nurse Practitioner

## 2021-04-17 ENCOUNTER — Encounter: Payer: Self-pay | Admitting: *Deleted

## 2021-04-17 ENCOUNTER — Inpatient Hospital Stay: Payer: Medicare Other | Admitting: Oncology

## 2021-04-17 NOTE — Progress Notes (Signed)
Patient decided not to come in today. Scheduling message sent to see Dr. Benay Spice next week if patient will agree.

## 2021-05-04 ENCOUNTER — Inpatient Hospital Stay: Payer: Medicare Other

## 2021-05-04 ENCOUNTER — Telehealth: Payer: Self-pay

## 2021-05-04 ENCOUNTER — Other Ambulatory Visit: Payer: Self-pay

## 2021-05-04 ENCOUNTER — Inpatient Hospital Stay: Payer: Medicare Other | Attending: Oncology | Admitting: Oncology

## 2021-05-04 VITALS — BP 87/68 | HR 61

## 2021-05-04 VITALS — BP 94/79 | HR 81 | Temp 98.1°F | Resp 18 | Ht 68.0 in | Wt 166.4 lb

## 2021-05-04 DIAGNOSIS — C250 Malignant neoplasm of head of pancreas: Secondary | ICD-10-CM | POA: Diagnosis not present

## 2021-05-04 DIAGNOSIS — Z79899 Other long term (current) drug therapy: Secondary | ICD-10-CM | POA: Insufficient documentation

## 2021-05-04 DIAGNOSIS — R630 Anorexia: Secondary | ICD-10-CM | POA: Diagnosis not present

## 2021-05-04 DIAGNOSIS — H409 Unspecified glaucoma: Secondary | ICD-10-CM | POA: Insufficient documentation

## 2021-05-04 DIAGNOSIS — C221 Intrahepatic bile duct carcinoma: Secondary | ICD-10-CM | POA: Diagnosis present

## 2021-05-04 LAB — CMP (CANCER CENTER ONLY)
ALT: 16 U/L (ref 0–44)
AST: 24 U/L (ref 15–41)
Albumin: 3.7 g/dL (ref 3.5–5.0)
Alkaline Phosphatase: 215 U/L — ABNORMAL HIGH (ref 38–126)
Anion gap: 13 (ref 5–15)
BUN: 19 mg/dL (ref 8–23)
CO2: 27 mmol/L (ref 22–32)
Calcium: 10.2 mg/dL (ref 8.9–10.3)
Chloride: 98 mmol/L (ref 98–111)
Creatinine: 1.3 mg/dL — ABNORMAL HIGH (ref 0.61–1.24)
GFR, Estimated: 58 mL/min — ABNORMAL LOW (ref >60)
Glucose, Bld: 159 mg/dL — ABNORMAL HIGH (ref 70–99)
Potassium: 4.9 mmol/L (ref 3.5–5.1)
Sodium: 138 mmol/L (ref 135–145)
Total Bilirubin: 0.6 mg/dL (ref 0.3–1.2)
Total Protein: 7.7 g/dL (ref 6.5–8.1)

## 2021-05-04 LAB — CBC WITH DIFFERENTIAL (CANCER CENTER ONLY)
Abs Immature Granulocytes: 0.07 10*3/uL (ref 0.00–0.07)
Basophils Absolute: 0.1 10*3/uL (ref 0.0–0.1)
Basophils Relative: 1 %
Eosinophils Absolute: 0.4 10*3/uL (ref 0.0–0.5)
Eosinophils Relative: 4 %
HCT: 34.4 % — ABNORMAL LOW (ref 39.0–52.0)
Hemoglobin: 11.2 g/dL — ABNORMAL LOW (ref 13.0–17.0)
Immature Granulocytes: 1 %
Lymphocytes Relative: 7 %
Lymphs Abs: 0.7 10*3/uL (ref 0.7–4.0)
MCH: 33.1 pg (ref 26.0–34.0)
MCHC: 32.6 g/dL (ref 30.0–36.0)
MCV: 101.8 fL — ABNORMAL HIGH (ref 80.0–100.0)
Monocytes Absolute: 1.1 10*3/uL — ABNORMAL HIGH (ref 0.1–1.0)
Monocytes Relative: 11 %
Neutro Abs: 8 10*3/uL — ABNORMAL HIGH (ref 1.7–7.7)
Neutrophils Relative %: 76 %
Platelet Count: 295 10*3/uL (ref 150–400)
RBC: 3.38 MIL/uL — ABNORMAL LOW (ref 4.22–5.81)
RDW: 15.8 % — ABNORMAL HIGH (ref 11.5–15.5)
WBC Count: 10.3 10*3/uL (ref 4.0–10.5)
nRBC: 0 % (ref 0.0–0.2)

## 2021-05-04 LAB — TSH: TSH: 84.827 u[IU]/mL — ABNORMAL HIGH (ref 0.350–4.500)

## 2021-05-04 LAB — MAGNESIUM: Magnesium: 1.7 mg/dL (ref 1.7–2.4)

## 2021-05-04 LAB — CORTISOL: Cortisol, Plasma: 28.3 ug/dL

## 2021-05-04 MED ORDER — MIDODRINE HCL 5 MG PO TABS: 5.0000 mg | ORAL_TABLET | Freq: Two times a day (BID) | ORAL | 30 refills | Status: AC

## 2021-05-04 MED ORDER — HEPARIN SOD (PORK) LOCK FLUSH 100 UNIT/ML IV SOLN
500.0000 [IU] | Freq: Once | INTRAVENOUS | Status: AC
  Administered 2021-05-04: 500 [IU] via INTRAVENOUS

## 2021-05-04 MED ORDER — SODIUM CHLORIDE 0.9 % IV SOLN: INTRAVENOUS | Status: DC

## 2021-05-04 NOTE — Patient Instructions (Signed)
Erik Fletcher  Discharge Instructions: Thank you for choosing [facility] to provide your oncology and hematology care.   If you have a lab appointment with the [facility], please go directly to the [facility] and check in at the registration area.   Wear comfortable clothing and clothing appropriate for easy access to any Portacath or PICC line.   We strive to give you quality time with your provider. You may need to reschedule your appointment if you arrive late (15 or more minutes).  Arriving late affects you and other patients whose appointments are after yours.  Also, if you miss three or more appointments without notifying the office, you may be dismissed from the clinic at the provider's discretion.      For prescription refill requests, have your pharmacy contact our office and allow 72 hours for refills to be completed.    Today you received the following chemotherapy and/or immunotherapy agents IV fluids - Normal Saline   To help prevent nausea and vomiting after your treatment, we encourage you to take your nausea medication as directed.  BELOW ARE SYMPTOMS THAT SHOULD BE REPORTED IMMEDIATELY: *FEVER GREATER THAN 100.4 F (38 C) OR HIGHER *CHILLS OR SWEATING *NAUSEA AND VOMITING THAT IS NOT CONTROLLED WITH YOUR NAUSEA MEDICATION *UNUSUAL SHORTNESS OF BREATH *UNUSUAL BRUISING OR BLEEDING *URINARY PROBLEMS (pain or burning when urinating, or frequent urination) *BOWEL PROBLEMS (unusual diarrhea, constipation, pain near the anus) TENDERNESS IN MOUTH AND THROAT WITH OR WITHOUT PRESENCE OF ULCERS (sore throat, sores in mouth, or a toothache) UNUSUAL RASH, SWELLING OR PAIN  UNUSUAL VAGINAL DISCHARGE OR ITCHING   Items with * indicate a potential emergency and should be followed up as soon as possible or go to the Emergency Department if any problems should occur.  Please show the CHEMOTHERAPY ALERT CARD or IMMUNOTHERAPY ALERT CARD at  check-in to the Emergency Department and triage nurse.  Should you have questions after your visit or need to cancel or reschedule your appointment, please contact [facility]  Dept: [phone]  and follow the prompts.  Office hours are 8:00 a.m. to 4:30 p.m. Monday - Friday. Please note that voicemails left after 4:00 p.m. may not be returned until the following business day.  We are closed weekends and major holidays. You have access to a nurse at all times for urgent questions. Please call the main number to the clinic Dept: [phone] and follow the prompts.   For any non-urgent questions, you may also contact your provider using MyChart. We now offer e-Visits for anyone 33 and older to request care online for non-urgent symptoms. For details visit [email or web address].   Also download the MyChart app! Go to the app store, search "MyChart", open the app, select [facility], and log in with your MyChart username and password.  Due to Covid, a mask is required upon entering the hospital/clinic. If you do not have a mask, one will be given to you upon arrival. For doctor visits, patients may have 1 support person aged 36 or older with them. For treatment visits, patients cannot have anyone with them due to current Covid guidelines and our immunocompromised population.

## 2021-05-04 NOTE — Telephone Encounter (Signed)
Prescription for midodrine 5 mg at 8 am and 5 mg at 2 pm 1 month supply sent in to pharmacy. Per Dr  Benay Spice.

## 2021-05-04 NOTE — Progress Notes (Signed)
Erik Fletcher   Diagnosis: Cholangiocarcinoma  INTERVAL HISTORY:   Erik Fletcher was last seen at the [facility] in September.  He has missed everal scheduled follow-up visits.  He reports "loss of equilibrium "when he stands up.  He falls a few days per week.  He reports adequate fluid intake.  He eats 2 meals per day.  No pain.  He is here today with his daughter.   Objective:  Vital signs in last 24 hours:  Blood pressure 94/79, pulse 81, temperature 98.1 F (36.7 C), temperature source Oral, resp. rate 18, height 5' 8"  (1.727 m), weight 166 lb 6.4 oz (75.5 kg), SpO2 98 %.    HEENT: No thrush or ulcers, mucous membranes are moist Resp: Lungs clear bilaterally Cardio: Regular rate and rhythm GI: The liver is palpable in the right upper abdomen, nontender Vascular: No leg edema Neuro: Alert and oriented, the motor exam appears intact in the upper and lower extremities bilaterally.  Finger-to-nose testing is normal.  The extraocular movements are intact.    Portacath/PICC-without erythema  Lab Results:  Lab Results  Component Value Date   WBC 10.3 05/04/2021   HGB 11.2 (L) 05/04/2021   HCT 34.4 (L) 05/04/2021   MCV 101.8 (H) 05/04/2021   PLT 295 05/04/2021   NEUTROABS 8.0 (H) 05/04/2021    CMP  Lab Results  Component Value Date   NA 138 05/04/2021   K 4.9 05/04/2021   CL 98 05/04/2021   CO2 27 05/04/2021   GLUCOSE 159 (H) 05/04/2021   BUN 19 05/04/2021   CREATININE 1.30 (H) 05/04/2021   CALCIUM 10.2 05/04/2021   PROT 7.7 05/04/2021   ALBUMIN 3.7 05/04/2021   AST 24 05/04/2021   ALT 16 05/04/2021   ALKPHOS 215 (H) 05/04/2021   BILITOT 0.6 05/04/2021   GFRNONAA 58 (L) 05/04/2021   GFRAA >60 10/11/2019    Lab Results  Component Value Date   CEA 54.5 (H) 12/01/2020   JSH702 99,891 (H) 03/02/2021      Medications: I have reviewed the patient's current medications.   Assessment/Plan: Intrahepatic  cholangiocarcinoma, clinical stage IIIb (T2N1M0) Abdominal ultrasound 11/24/2020-by lobar hepatic lesions including a large infiltrative right liver lesion and heterogenous mass superior to the pancreas head MRI abdomen 11/29/2020-numerous hypoenhancing by lobar liver lesions suggestive of cholangiocarcinoma, multiple abnormal portacaval and celiac axis/gastropathic ligament nodes consistent with metastatic disease Ultrasound-guided biopsy of a right liver lesion 12/11/2020-adenocarcinoma, immunohistochemical profile and morphology consistent with a pancreaticobiliary primary MSS, tumor mutation burden-1, no FGFR2 alteration Markedly elevated CA 19-9 CT 12/20/2020-numerous right and left liver lesions, numerous portacaval, celiac, and gastropathic ligament nodes, no evidence of metastatic disease in the chest Cycle 1 gemcitabine/cisplatin/Durvalumab 12/28/2020 Cycle 2 gemcitabine/cisplatin/Durvalumab 01/19/2021 Cycle 3 gemcitabine/cisplatin/Durvalumab 02/09/2021, day 8 held due to hypotension/dehydration CTs 02/27/2021-new and enlarging hepatic metastatic disease and enlarging periportal/gastrohepatic ligament adenopathy. 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  He has been maintained off of systemic therapy since completing a cycle of FOLFOX on 03/21/2021.  He complains of dizziness when standing.  He does not appear dehydrated.  He may have orthostatic hypotension related to neuropathy or adrenal insufficiency. We will obtain additional laboratory evaluation to include a chemistry panel and cortisol level.  He will receive intravenous fluids today.  We will check orthostatic vital signs.  I will prescribe  midodrine if he has significant orthostasis.  Erik Fletcher agrees to a follow-up visit next week.  We will discuss continuing  systemic therapy versus a hospice referral when he returns next week.  I have a low clinical suspicion for CNS metastases.   Betsy Coder, MD  05/04/2021  1:21 PM

## 2021-05-07 ENCOUNTER — Telehealth: Payer: Self-pay

## 2021-05-07 NOTE — Telephone Encounter (Signed)
Pt verbalized understanding about increase in Euthyrox to 2 pills a day which would be 100 mcg daily and to follow up with his PCP.

## 2021-05-07 NOTE — Telephone Encounter (Signed)
-----   Message from Ladell Pier, MD sent at 05/04/2021  4:37 PM EST ----- Please call patient, thyroid function is low, increase the thyroid hormone to 100 mcg daily, he needs to follow-up with his primary physician next week

## 2021-05-11 ENCOUNTER — Inpatient Hospital Stay (HOSPITAL_BASED_OUTPATIENT_CLINIC_OR_DEPARTMENT_OTHER): Payer: Medicare Other | Admitting: Nurse Practitioner

## 2021-05-11 ENCOUNTER — Other Ambulatory Visit: Payer: Self-pay

## 2021-05-11 ENCOUNTER — Encounter: Payer: Self-pay | Admitting: Nurse Practitioner

## 2021-05-11 VITALS — BP 96/73 | HR 87 | Temp 98.1°F | Resp 18 | Ht 68.0 in | Wt 171.0 lb

## 2021-05-11 DIAGNOSIS — C221 Intrahepatic bile duct carcinoma: Secondary | ICD-10-CM | POA: Diagnosis not present

## 2021-05-11 NOTE — Progress Notes (Signed)
[facility] OFFICE PROGRESS NOTE   Diagnosis: Cholangiocarcinoma  INTERVAL HISTORY:   Erik Fletcher returns as scheduled.  He began midodrine following last office visit.  He increased Euthyrox.  He is feeling some better.  Blood pressure overall has been higher at home.  He is less lightheaded.  No nausea or vomiting.  No diarrhea.  No falls.  He is walking short distances without his walker.  For longer distances he uses the walker in case he becomes lightheaded.  Appetite has been "a little better".  Objective:  Vital signs in last 24 hours:  Blood pressure 96/73, pulse 87, temperature 98.1 F (36.7 C), temperature source Oral, resp. rate 18, height _0  (1.727 m), weight 171 lb (77.6 kg), SpO2 98 %.    HEENT: No thrush or ulcers. Resp: Lungs clear bilaterally. Cardio: Regular rate and rhythm. GI: Liver is palpable right upper abdomen.  Nontender. Vascular: No leg edema. Neuro: Alert and oriented. Skin: Pale appearing. Port-A-Cath without erythema.  Lab Results:  Lab Results  Component Value Date   WBC 10.3 05/04/2021   HGB 11.2 (L) 05/04/2021   HCT 34.4 (L) 05/04/2021   MCV 101.8 (H) 05/04/2021   PLT 295 05/04/2021   NEUTROABS 8.0 (H) 05/04/2021    Imaging:  No results found.  Medications: I have reviewed the patient's current medications.  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 Cycle 1 FOLFOX 03/06/2021 Cycle 2 FOLFOX 03/21/2021 Glaucoma Anorexia/weight loss secondary to #1 Pain secondary to #1 Port-A-Cath placement 12/27/2020 TSH markedly elevated 05/04/2021-Euthyrox increased from 50 mcg to 100 mcg  Disposition: Mr. Greulich appears some improved.  He will continue midodrine and the increased dose of Euthyrox.  He will schedule follow-up with Dr. Harrington Challenger regarding further adjustments to the thyroid medication.  He would like to continue chemotherapy.  We will see him back in about 2 weeks with the plan to resume FOLFOX if there has been continued improvement in his performance status.  He will return for lab, follow-up, possible FOLFOX in 2 weeks.  We are available to see him sooner if needed.  Patient seen with Dr. Benay Spice.    [name] ANP/GNP-BC   05/11/2021  11:14 AM  This was a shared visit with [name].  Ms. Erik Fletcher has experienced partial improvement in his performance status with the addition of midodrine and an increased dose of Synthroid.  We discussed treatment options with [name] and his wife.  He would like to continue the current medications and return for reassessment in 2 weeks.  He will then decide on continuing FOLFOX versus hospice care.  I was present for greater than 50% of today's visit.  I performed medical decision making.  Julieanne Manson, MD

## 2021-05-24 ENCOUNTER — Ambulatory Visit (HOSPITAL_BASED_OUTPATIENT_CLINIC_OR_DEPARTMENT_OTHER)
Admission: RE | Admit: 2021-05-24 | Discharge: 2021-05-24 | Disposition: A | Discharge status: Home / Self Care | Payer: Medicare Other | Source: Ambulatory Visit | Attending: Nurse Practitioner | Admitting: Nurse Practitioner

## 2021-05-24 ENCOUNTER — Telehealth: Payer: Self-pay

## 2021-05-24 ENCOUNTER — Ambulatory Visit: Payer: Medicare Other | Admitting: Oncology

## 2021-05-24 ENCOUNTER — Other Ambulatory Visit: Payer: Medicare Other

## 2021-05-24 ENCOUNTER — Inpatient Hospital Stay: Payer: Medicare Other

## 2021-05-24 ENCOUNTER — Encounter: Payer: Self-pay | Admitting: Nurse Practitioner

## 2021-05-24 ENCOUNTER — Inpatient Hospital Stay: Payer: Medicare Other | Attending: Oncology

## 2021-05-24 ENCOUNTER — Inpatient Hospital Stay (HOSPITAL_BASED_OUTPATIENT_CLINIC_OR_DEPARTMENT_OTHER): Payer: Medicare Other | Admitting: Nurse Practitioner

## 2021-05-24 ENCOUNTER — Other Ambulatory Visit: Payer: Self-pay

## 2021-05-24 ENCOUNTER — Ambulatory Visit: Payer: Medicare Other

## 2021-05-24 VITALS — BP 115/80 | HR 94

## 2021-05-24 VITALS — BP 94/70 | HR 91 | Temp 97.8°F | Resp 18 | Ht 68.0 in | Wt 175.8 lb

## 2021-05-24 DIAGNOSIS — C221 Intrahepatic bile duct carcinoma: Secondary | ICD-10-CM | POA: Insufficient documentation

## 2021-05-24 DIAGNOSIS — D709 Neutropenia, unspecified: Secondary | ICD-10-CM | POA: Insufficient documentation

## 2021-05-24 DIAGNOSIS — R630 Anorexia: Secondary | ICD-10-CM | POA: Insufficient documentation

## 2021-05-24 DIAGNOSIS — R5381 Other malaise: Secondary | ICD-10-CM | POA: Insufficient documentation

## 2021-05-24 DIAGNOSIS — D696 Thrombocytopenia, unspecified: Secondary | ICD-10-CM | POA: Insufficient documentation

## 2021-05-24 DIAGNOSIS — H409 Unspecified glaucoma: Secondary | ICD-10-CM | POA: Insufficient documentation

## 2021-05-24 LAB — CMP (CANCER CENTER ONLY)
ALT: 17 U/L (ref 0–44)
AST: 33 U/L (ref 15–41)
Albumin: 3.6 g/dL (ref 3.5–5.0)
Alkaline Phosphatase: 215 U/L — ABNORMAL HIGH (ref 38–126)
Anion gap: 12 (ref 5–15)
BUN: 13 mg/dL (ref 8–23)
CO2: 28 mmol/L (ref 22–32)
Calcium: 9.9 mg/dL (ref 8.9–10.3)
Chloride: 98 mmol/L (ref 98–111)
Creatinine: 0.85 mg/dL (ref 0.61–1.24)
GFR, Estimated: >60 mL/min
Glucose, Bld: 175 mg/dL — ABNORMAL HIGH (ref 70–99)
Potassium: 3.8 mmol/L (ref 3.5–5.1)
Sodium: 138 mmol/L (ref 135–145)
Total Bilirubin: 0.7 mg/dL (ref 0.3–1.2)
Total Protein: 7.1 g/dL (ref 6.5–8.1)

## 2021-05-24 LAB — CBC WITH DIFFERENTIAL (CANCER CENTER ONLY)
Abs Immature Granulocytes: 0.03 10*3/uL (ref 0.00–0.07)
Basophils Absolute: 0.1 10*3/uL (ref 0.0–0.1)
Basophils Relative: 1 %
Eosinophils Absolute: 0.2 10*3/uL (ref 0.0–0.5)
Eosinophils Relative: 2 %
HCT: 34.5 % — ABNORMAL LOW (ref 39.0–52.0)
Hemoglobin: 11.2 g/dL — ABNORMAL LOW (ref 13.0–17.0)
Immature Granulocytes: 0 %
Lymphocytes Relative: 7 %
Lymphs Abs: 0.8 10*3/uL (ref 0.7–4.0)
MCH: 32.8 pg (ref 26.0–34.0)
MCHC: 32.5 g/dL (ref 30.0–36.0)
MCV: 101.2 fL — ABNORMAL HIGH (ref 80.0–100.0)
Monocytes Absolute: 1.2 10*3/uL — ABNORMAL HIGH (ref 0.1–1.0)
Monocytes Relative: 11 %
Neutro Abs: 9.2 10*3/uL — ABNORMAL HIGH (ref 1.7–7.7)
Neutrophils Relative %: 79 %
Platelet Count: 220 10*3/uL (ref 150–400)
RBC: 3.41 MIL/uL — ABNORMAL LOW (ref 4.22–5.81)
RDW: 15.2 % (ref 11.5–15.5)
WBC Count: 11.4 10*3/uL — ABNORMAL HIGH (ref 4.0–10.5)
nRBC: 0 % (ref 0.0–0.2)

## 2021-05-24 MED ORDER — OXALIPLATIN CHEMO INJECTION 100 MG/20ML
85.0000 mg/m2 | Freq: Once | INTRAVENOUS | Status: AC
  Administered 2021-05-24: 165 mg via INTRAVENOUS
  Filled 2021-05-24: qty 33

## 2021-05-24 MED ORDER — DEXTROSE 5 % IV SOLN: Freq: Once | INTRAVENOUS | Status: AC

## 2021-05-24 MED ORDER — LEUCOVORIN CALCIUM INJECTION 350 MG
400.0000 mg/m2 | Freq: Once | INTRAVENOUS | Status: AC
  Administered 2021-05-24: 768 mg via INTRAVENOUS
  Filled 2021-05-24: qty 38.4

## 2021-05-24 MED ORDER — FLUOROURACIL CHEMO INJECTION 2.5 GM/50ML
400.0000 mg/m2 | Freq: Once | INTRAVENOUS | Status: AC
  Administered 2021-05-24: 750 mg via INTRAVENOUS
  Filled 2021-05-24: qty 15

## 2021-05-24 MED ORDER — SODIUM CHLORIDE 0.9 % IV SOLN
10.0000 mg | Freq: Once | INTRAVENOUS | Status: AC
  Administered 2021-05-24: 10 mg via INTRAVENOUS
  Filled 2021-05-24: qty 1

## 2021-05-24 MED ORDER — SODIUM CHLORIDE 0.9 % IV SOLN
5000.0000 mg | INTRAVENOUS | Status: DC
  Administered 2021-05-24: 5000 mg via INTRAVENOUS
  Filled 2021-05-24: qty 100

## 2021-05-24 MED ORDER — PALONOSETRON HCL INJECTION 0.25 MG/5ML
0.2500 mg | Freq: Once | INTRAVENOUS | Status: AC
  Administered 2021-05-24: 0.25 mg via INTRAVENOUS
  Filled 2021-05-24: qty 5

## 2021-05-24 NOTE — Progress Notes (Signed)
Patient presents for treatment. RN assessment completed along with the following:  Labs/vitals reviewed - Yes, and within treatment parameters.   Weight within 10% of previous measurement - Yes Oncology Treatment Attestation completed for current therapy- Yes, on date 03/02/2021 Informed consent completed and reflects current therapy/intent - Yes, on date 03/06/2021             Provider progress note reviewed - Yes, today's provider note was reviewed. Treatment/Antibody/Supportive plan reviewed - Yes, and there are no adjustments needed for today's treatment. S&H and other orders reviewed - Yes, and there are no additional orders identified. Previous treatment date reviewed - Yes, and the appropriate amount of time has elapsed between treatments. Clinic Hand Off Received from - Yes  Patient to proceed with treatment.

## 2021-05-24 NOTE — Telephone Encounter (Signed)
Patient seen by Lisa Thomas NP today  Vitals are within treatment parameters.  Labs reviewed by Lisa Thomas NP and are within treatment parameters.  Per physician team, patient is ready for treatment and there are NO modifications to the treatment plan.     

## 2021-05-24 NOTE — Telephone Encounter (Signed)
Tube was draw for CA-19 send to main lab

## 2021-05-24 NOTE — Patient Instructions (Addendum)
[facility]  The chemotherapy medication bag should finish at 46 hours, 96 hours, or 7 days. For example, if your pump is scheduled for 46 hours and it was put on at 4:00 p.m., it should finish at 2:00 p.m. the day it is scheduled to come off regardless of your appointment time.     Estimated time to finish at 1:30 Friday, May 26, 2021 at [facility].   If the display on your pump reads "Low Volume" and it is beeping, take the batteries out of the pump and come to the cancer center for it to be taken off.   If the pump alarms go off prior to the pump reading "Low Volume" then call [phone] and someone can assist you.  If the plunger comes out and the chemotherapy medication is leaking out, please use your home chemo spill kit to clean up the spill. Do NOT use paper towels or other household products.  If you have problems or questions regarding your pump, please call either [phone] (24 hours a day) or the cancer center Monday-Friday 8:00 a.m.- 4:30 p.m. at the clinic number and we will assist you. If you are unable to get assistance, then go to the nearest Emergency Department and ask the staff to contact the IV team for assistance.   Discharge Instructions: Thank you for choosing [facility] to provide your oncology and hematology care.   If you have a lab appointment with the [facility], please go directly to the [facility] and check in at the registration area.   Wear comfortable clothing and clothing appropriate for easy access to any Portacath or PICC line.   We strive to give you quality time with your provider. You may need to reschedule your appointment if you arrive late (15 or more minutes).  Arriving late affects you and other patients whose appointments are after yours.  Also, if you miss three or more appointments without notifying the office, you may be dismissed from the clinic at the provider's discretion.      For  prescription refill requests, have your pharmacy contact our office and allow 72 hours for refills to be completed.    Today you received the following chemotherapy and/or immunotherapy agents Oxaliplatin, leucovorin, fluorouracil      To help prevent nausea and vomiting after your treatment, we encourage you to take your nausea medication as directed.  BELOW ARE SYMPTOMS THAT SHOULD BE REPORTED IMMEDIATELY: *FEVER GREATER THAN 100.4 F (38 C) OR HIGHER *CHILLS OR SWEATING *NAUSEA AND VOMITING THAT IS NOT CONTROLLED WITH YOUR NAUSEA MEDICATION *UNUSUAL SHORTNESS OF BREATH *UNUSUAL BRUISING OR BLEEDING *URINARY PROBLEMS (pain or burning when urinating, or frequent urination) *BOWEL PROBLEMS (unusual diarrhea, constipation, pain near the anus) TENDERNESS IN MOUTH AND THROAT WITH OR WITHOUT PRESENCE OF ULCERS (sore throat, sores in mouth, or a toothache) UNUSUAL RASH, SWELLING OR PAIN  UNUSUAL VAGINAL DISCHARGE OR ITCHING   Items with * indicate a potential emergency and should be followed up as soon as possible or go to the Emergency Department if any problems should occur.  Please show the CHEMOTHERAPY ALERT CARD or IMMUNOTHERAPY ALERT CARD at check-in to the Emergency Department and triage nurse.  Should you have questions after your visit or need to cancel or reschedule your appointment, please contact [facility]  Dept: [phone]  and follow the prompts.  Office hours are 8:00 a.m. to 4:30 p.m. Monday - Friday. Please  note that voicemails left after 4:00 p.m. may not be returned until the following business day.  We are closed weekends and major holidays. You have access to a nurse at all times for urgent questions. Please call the main number to the clinic Dept: [phone] and follow the prompts.   For any non-urgent questions, you may also contact your provider using MyChart. We now offer e-Visits for anyone 28 and older to request care online for  non-urgent symptoms. For details visit [email or web address].   Also download the MyChart app! Go to the app store, search "MyChart", open the app, select [facility], and log in with your MyChart username and password.  Due to Covid, a mask is required upon entering the hospital/clinic. If you do not have a mask, one will be given to you upon arrival. For doctor visits, patients may have 1 support person aged 19 or older with them. For treatment visits, patients cannot have anyone with them due to current Covid guidelines and our immunocompromised population.   Oxaliplatin Injection What is this medication? OXALIPLATIN (ox AL i PLA tin) is a chemotherapy drug. It targets fast dividing cells, like cancer cells, and causes these cells to die. This medicine is used to treat cancers of the colon and rectum, and many other cancers. This medicine may be used for other purposes; ask your health care provider or pharmacist if you have questions. COMMON BRAND NAME(S): Eloxatin What should I tell my care team before I take this medication? They need to know if you have any of these conditions: heart disease history of irregular heartbeat liver disease low blood counts, like white cells, platelets, or red blood cells lung or breathing disease, like asthma take medicines that treat or prevent blood clots tingling of the fingers or toes, or other nerve disorder an unusual or allergic reaction to oxaliplatin, other chemotherapy, other medicines, foods, dyes, or preservatives pregnant or trying to get pregnant breast-feeding How should I use this medication? This drug is given as an infusion into a vein. It is administered in a hospital or clinic by a specially trained health care professional. Talk to your pediatrician regarding the use of this medicine in children. Special care may be needed. Overdosage: If you think you have taken too much of this medicine contact a poison control center or emergency  room at once. NOTE: This medicine is only for you. Do not share this medicine with others. What if I miss a dose? It is important not to miss a dose. Call your doctor or health care professional if you are unable to keep an appointment. What may interact with this medication? Do not take this medicine with any of the following medications: cisapride dronedarone pimozide thioridazine This medicine may also interact with the following medications: aspirin and aspirin-like medicines certain medicines that treat or prevent blood clots like warfarin, apixaban, dabigatran, and rivaroxaban cisplatin cyclosporine diuretics medicines for infection like acyclovir, adefovir, amphotericin B, bacitracin, cidofovir, foscarnet, ganciclovir, gentamicin, pentamidine, vancomycin NSAIDs, medicines for pain and inflammation, like ibuprofen or naproxen other medicines that prolong the QT interval (an abnormal heart rhythm) pamidronate zoledronic acid This list may not describe all possible interactions. Give your health care provider a list of all the medicines, herbs, non-prescription drugs, or dietary supplements you use. Also tell them if you smoke, drink alcohol, or use illegal drugs. Some items may interact with your medicine. What should I watch for while using this medication? Your condition will be monitored carefully while you  are receiving this medicine. You may need blood work done while you are taking this medicine. This medicine may make you feel generally unwell. This is not uncommon as chemotherapy can affect healthy cells as well as cancer cells. Report any side effects. Continue your course of treatment even though you feel ill unless your healthcare professional tells you to stop. This medicine can make you more sensitive to cold. Do not drink cold drinks or use ice. Cover exposed skin before coming in contact with cold temperatures or cold objects. When out in cold weather wear warm clothing  and cover your mouth and nose to warm the air that goes into your lungs. Tell your doctor if you get sensitive to the cold. Do not become pregnant while taking this medicine or for 9 months after stopping it. Women should inform their health care professional if they wish to become pregnant or think they might be pregnant. Men should not father a child while taking this medicine and for 6 months after stopping it. There is potential for serious side effects to an unborn child. Talk to your health care professional for more information. Do not breast-feed a child while taking this medicine or for 3 months after stopping it. This medicine has caused ovarian failure in some women. This medicine may make it more difficult to get pregnant. Talk to your health care professional if you are concerned about your fertility. This medicine has caused decreased sperm counts in some men. This may make it more difficult to father a child. Talk to your health care professional if you are concerned about your fertility. This medicine may increase your risk of getting an infection. Call your health care professional for advice if you get a fever, chills, or sore throat, or other symptoms of a cold or flu. Do not treat yourself. Try to avoid being around people who are sick. Avoid taking medicines that contain aspirin, acetaminophen, ibuprofen, naproxen, or ketoprofen unless instructed by your health care professional. These medicines may hide a fever. Be careful brushing or flossing your teeth or using a toothpick because you may get an infection or bleed more easily. If you have any dental work done, tell your dentist you are receiving this medicine. What side effects may I notice from receiving this medication? Side effects that you should report to your doctor or health care professional as soon as possible: allergic reactions like skin rash, itching or hives, swelling of the face, lips, or tongue breathing  problems cough low blood counts - this medicine may decrease the number of white blood cells, red blood cells, and platelets. You may be at increased risk for infections and bleeding nausea, vomiting pain, redness, or irritation at site where injected pain, tingling, numbness in the hands or feet signs and symptoms of bleeding such as bloody or black, tarry stools; red or dark brown urine; spitting up blood or brown material that looks like coffee grounds; red spots on the skin; unusual bruising or bleeding from the eyes, gums, or nose signs and symptoms of a dangerous change in heartbeat or heart rhythm like chest pain; dizziness; fast, irregular heartbeat; palpitations; feeling faint or lightheaded; falls signs and symptoms of infection like fever; chills; cough; sore throat; pain or trouble passing urine signs and symptoms of liver injury like dark yellow or brown urine; general ill feeling or flu-like symptoms; light-colored stools; loss of appetite; nausea; right upper belly pain; unusually weak or tired; yellowing of the eyes or skin signs  and symptoms of low red blood cells or anemia such as unusually weak or tired; feeling faint or lightheaded; falls signs and symptoms of muscle injury like dark urine; trouble passing urine or change in the amount of urine; unusually weak or tired; muscle pain; back pain Side effects that usually do not require medical attention (report to your doctor or health care professional if they continue or are bothersome): changes in taste diarrhea gas hair loss loss of appetite mouth sores This list may not describe all possible side effects. Call your doctor for medical advice about side effects. You may report side effects to FDA at 1-800-FDA-1088. Where should I keep my medication? This drug is given in a hospital or clinic and will not be stored at home. NOTE: This sheet is a summary. It may not cover all possible information. If you have questions about  this medicine, talk to your doctor, pharmacist, or health care provider.  2022 [employer]vier/Gold Standard (2021-02-27 00:00:00)  Leucovorin injection What is this medication? LEUCOVORIN (loo koe VOR in) is used to prevent or treat the harmful effects of some medicines. This medicine is used to treat anemia caused by a low amount of folic acid in the body. It is also used with 5-fluorouracil (5-FU) to treat colon cancer. This medicine may be used for other purposes; ask your health care provider or pharmacist if you have questions. What should I tell my care team before I take this medication? They need to know if you have any of these conditions: anemia from low levels of vitamin B-12 in the blood an unusual or allergic reaction to leucovorin, folic acid, other medicines, foods, dyes, or preservatives pregnant or trying to get pregnant breast-feeding How should I use this medication? This medicine is for injection into a muscle or into a vein. It is given by a health care professional in a hospital or clinic setting. Talk to your pediatrician regarding the use of this medicine in children. Special care may be needed. Overdosage: If you think you have taken too much of this medicine contact a poison control center or emergency room at once. NOTE: This medicine is only for you. Do not share this medicine with others. What if I miss a dose? This does not apply. What may interact with this medication? capecitabine fluorouracil phenobarbital phenytoin primidone trimethoprim-sulfamethoxazole This list may not describe all possible interactions. Give your health care provider a list of all the medicines, herbs, non-prescription drugs, or dietary supplements you use. Also tell them if you smoke, drink alcohol, or use illegal drugs. Some items may interact with your medicine. What should I watch for while using this medication? Your condition will be monitored carefully while you are receiving this  medicine. This medicine may increase the side effects of 5-fluorouracil, 5-FU. Tell your doctor or health care professional if you have diarrhea or mouth sores that do not get better or that get worse. What side effects may I notice from receiving this medication? Side effects that you should report to your doctor or health care professional as soon as possible: allergic reactions like skin rash, itching or hives, swelling of the face, lips, or tongue breathing problems fever, infection mouth sores unusual bleeding or bruising unusually weak or tired Side effects that usually do not require medical attention (report to your doctor or health care professional if they continue or are bothersome): constipation or diarrhea loss of appetite nausea, vomiting This list may not describe all possible side effects. Call your  doctor for medical advice about side effects. You may report side effects to FDA at 1-800-FDA-1088. Where should I keep my medication? This drug is given in a hospital or clinic and will not be stored at home. NOTE: This sheet is a summary. It may not cover all possible information. If you have questions about this medicine, talk to your doctor, pharmacist, or health care provider.  2022 Else[employer]/Gold Standard (2007-12-17 00:00:00)  Fluorouracil, 5-FU injection What is this medication? FLUOROURACIL, 5-FU (flure oh YOOR a sil) is a chemotherapy drug. It slows the growth of cancer cells. This medicine is used to treat many types of cancer like breast cancer, colon or rectal cancer, pancreatic cancer, and stomach cancer. This medicine may be used for other purposes; ask your health care provider or pharmacist if you have questions. COMMON BRAND NAME(S): Adrucil What should I tell my care team before I take this medication? They need to know if you have any of these conditions: blood disorders dihydropyrimidine dehydrogenase (DPD) deficiency infection (especially a virus  infection such as chickenpox, cold sores, or herpes) kidney disease liver disease malnourished, poor nutrition recent or ongoing radiation therapy an unusual or allergic reaction to fluorouracil, other chemotherapy, other medicines, foods, dyes, or preservatives pregnant or trying to get pregnant breast-feeding How should I use this medication? This drug is given as an infusion or injection into a vein. It is administered in a hospital or clinic by a specially trained health care professional. Talk to your pediatrician regarding the use of this medicine in children. Special care may be needed. Overdosage: If you think you have taken too much of this medicine contact a poison control center or emergency room at once. NOTE: This medicine is only for you. Do not share this medicine with others. What if I miss a dose? It is important not to miss your dose. Call your doctor or health care professional if you are unable to keep an appointment. What may interact with this medication? Do not take this medicine with any of the following medications: live virus vaccines This medicine may also interact with the following medications: medicines that treat or prevent blood clots like warfarin, enoxaparin, and dalteparin This list may not describe all possible interactions. Give your health care provider a list of all the medicines, herbs, non-prescription drugs, or dietary supplements you use. Also tell them if you smoke, drink alcohol, or use illegal drugs. Some items may interact with your medicine. What should I watch for while using this medication? Visit your doctor for checks on your progress. This drug may make you feel generally unwell. This is not uncommon, as chemotherapy can affect healthy cells as well as cancer cells. Report any side effects. Continue your course of treatment even though you feel ill unless your doctor tells you to stop. In some cases, you may be given additional medicines to  help with side effects. Follow all directions for their use. Call your doctor or health care professional for advice if you get a fever, chills or sore throat, or other symptoms of a cold or flu. Do not treat yourself. This drug decreases your body's ability to fight infections. Try to avoid being around people who are sick. This medicine may increase your risk to bruise or bleed. Call your doctor or health care professional if you notice any unusual bleeding. Be careful brushing and flossing your teeth or using a toothpick because you may get an infection or bleed more easily. If you have any  dental work done, tell your dentist you are receiving this medicine. Avoid taking products that contain aspirin, acetaminophen, ibuprofen, naproxen, or ketoprofen unless instructed by your doctor. These medicines may hide a fever. Do not become pregnant while taking this medicine. Women should inform their doctor if they wish to become pregnant or think they might be pregnant. There is a potential for serious side effects to an unborn child. Talk to your health care professional or pharmacist for more information. Do not breast-feed an infant while taking this medicine. Men should inform their doctor if they wish to father a child. This medicine may lower sperm counts. Do not treat diarrhea with over the counter products. Contact your doctor if you have diarrhea that lasts more than 2 days or if it is severe and watery. This medicine can make you more sensitive to the sun. Keep out of the sun. If you cannot avoid being in the sun, wear protective clothing and use sunscreen. Do not use sun lamps or tanning beds/booths. What side effects may I notice from receiving this medication? Side effects that you should report to your doctor or health care professional as soon as possible: allergic reactions like skin rash, itching or hives, swelling of the face, lips, or tongue low blood counts - this medicine may decrease  the number of white blood cells, red blood cells and platelets. You may be at increased risk for infections and bleeding. signs of infection - fever or chills, cough, sore throat, pain or difficulty passing urine signs of decreased platelets or bleeding - bruising, pinpoint red spots on the skin, black, tarry stools, blood in the urine signs of decreased red blood cells - unusually weak or tired, fainting spells, lightheadedness breathing problems changes in vision chest pain mouth sores nausea and vomiting pain, swelling, redness at site where injected pain, tingling, numbness in the hands or feet redness, swelling, or sores on hands or feet stomach pain unusual bleeding Side effects that usually do not require medical attention (report to your doctor or health care professional if they continue or are bothersome): changes in finger or toe nails diarrhea dry or itchy skin hair loss headache loss of appetite sensitivity of eyes to the light stomach upset unusually teary eyes This list may not describe all possible side effects. Call your doctor for medical advice about side effects. You may report side effects to FDA at 1-800-FDA-1088. Where should I keep my medication? This drug is given in a hospital or clinic and will not be stored at home. NOTE: This sheet is a summary. It may not cover all possible information. If you have questions about this medicine, talk to your doctor, pharmacist, or health care provider.  2022 [employer]vier/Gold Standard (2021-02-27 00:00:00)

## 2021-05-24 NOTE — Progress Notes (Signed)
  [place] OFFICE PROGRESS NOTE   Diagnosis: Cholangiocarcinoma  INTERVAL HISTORY:   Erik Fletcher returns as scheduled.  Appetite and energy level remain poor.  No falls.  He had an episode of lightheadedness, abdominal cramps and dry heaves this morning.  Objective:  Vital signs in last 24 hours:  Blood pressure 94/70, pulse 91, temperature 97.8 F (36.6 C), temperature source Oral, resp. rate 18, height $RemoveBe'5\' 8"'psAgsBORp$  (1.727 m), weight 175 lb 12.8 oz (79.7 kg), SpO2 98 %.    HEENT: No thrush or ulcers. Resp: Lungs clear bilaterally. Cardio: Regular rate and rhythm. GI: Liver palpable right upper abdomen.  Nontender. Vascular: No leg edema.  The left lower leg is slightly larger than the right lower leg. Neuro: Alert and oriented. Skin: Skin turgor is intact.  He is pale appearing. Port-A-Cath without erythema.   Lab Results:  Lab Results  Component Value Date   WBC 11.4 (H) 05/24/2021   HGB 11.2 (L) 05/24/2021   HCT 34.5 (L) 05/24/2021   MCV 101.2 (H) 05/24/2021   PLT 220 05/24/2021   NEUTROABS 9.2 (H) 05/24/2021    Imaging:  No results found.  Medications: I have reviewed the patient's current medications.  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 Cycle 1 FOLFOX 03/06/2021 Cycle 2 FOLFOX 03/21/2021 Glaucoma Anorexia/weight loss secondary to #1 Pain secondary to #1 Port-A-Cath placement 12/27/2020 TSH markedly elevated 05/04/2021-Euthyrox increased from 50 mcg to 100 mcg  Disposition: Erik Fletcher continues to have a borderline performance status.  We discussed chemotherapy being more difficult to tolerate with a poor performance status.  He would like to resume treatment with FOLFOX to see if his overall condition will improve.  Plan to proceed with FOLFOX today as scheduled.  CBC and chemistry panel reviewed.  Labs adequate to proceed as above.  The left lower leg appears slightly larger than the right lower leg.  We are referring him for a Doppler study.  He will return for lab, follow-up, FOLFOX in 2 weeks.  We are available to see him sooner if needed.  Patient seen with Dr. Benay Fletcher.  Erik Fletcher   05/24/2021  10:26 AM  This was a shared visit with Erik Fletcher.  Erik Fletcher was interviewed and examined.  He appears unchanged.  His wife was present for today's visit.  We discussed comfort care versus FOLFOX.  He would like to continue chemotherapy.  We encouraged him to increase his activity level as tolerated.  We will complete another cycle of FOLFOX today.  I was present for greater than 50% of today's visit.  I performed medical decision making.  Erik Fletcher

## 2021-05-25 LAB — CANCER ANTIGEN 19-9: CA 19-9: 200030 U/mL — ABNORMAL HIGH (ref 0–35)

## 2021-05-26 ENCOUNTER — Inpatient Hospital Stay: Payer: Medicare Other

## 2021-05-26 ENCOUNTER — Other Ambulatory Visit: Payer: Self-pay

## 2021-05-26 VITALS — BP 93/64 | HR 100 | Temp 98.0°F | Resp 17

## 2021-05-26 DIAGNOSIS — D696 Thrombocytopenia, unspecified: Secondary | ICD-10-CM | POA: Diagnosis not present

## 2021-05-26 DIAGNOSIS — H409 Unspecified glaucoma: Secondary | ICD-10-CM | POA: Diagnosis not present

## 2021-05-26 DIAGNOSIS — Z95828 Presence of other vascular implants and grafts: Secondary | ICD-10-CM

## 2021-05-26 DIAGNOSIS — R5381 Other malaise: Secondary | ICD-10-CM | POA: Diagnosis not present

## 2021-05-26 DIAGNOSIS — R630 Anorexia: Secondary | ICD-10-CM | POA: Diagnosis not present

## 2021-05-26 DIAGNOSIS — C221 Intrahepatic bile duct carcinoma: Secondary | ICD-10-CM | POA: Diagnosis not present

## 2021-05-26 DIAGNOSIS — D709 Neutropenia, unspecified: Secondary | ICD-10-CM | POA: Diagnosis not present

## 2021-05-26 MED ORDER — HEPARIN SOD (PORK) LOCK FLUSH 100 UNIT/ML IV SOLN
500.0000 [IU] | Freq: Once | INTRAVENOUS | Status: AC
  Administered 2021-05-26: 500 [IU] via INTRAVENOUS

## 2021-05-26 MED ORDER — SODIUM CHLORIDE 0.9% FLUSH
10.0000 mL | Freq: Once | INTRAVENOUS | Status: AC
  Administered 2021-05-26: 10 mL via INTRAVENOUS

## 2021-05-28 ENCOUNTER — Encounter: Payer: Self-pay | Admitting: Oncology

## 2021-06-02 ENCOUNTER — Other Ambulatory Visit: Payer: Self-pay | Admitting: Oncology

## 2021-06-06 ENCOUNTER — Inpatient Hospital Stay: Payer: Medicare Other

## 2021-06-06 ENCOUNTER — Other Ambulatory Visit: Payer: Self-pay

## 2021-06-06 ENCOUNTER — Other Ambulatory Visit: Payer: Self-pay | Admitting: *Deleted

## 2021-06-06 ENCOUNTER — Inpatient Hospital Stay (HOSPITAL_BASED_OUTPATIENT_CLINIC_OR_DEPARTMENT_OTHER): Payer: Medicare Other | Admitting: Oncology

## 2021-06-06 ENCOUNTER — Telehealth: Payer: Self-pay

## 2021-06-06 VITALS — BP 90/60 | HR 98 | Temp 98.1°F | Resp 20 | Ht 68.0 in | Wt 174.0 lb

## 2021-06-06 DIAGNOSIS — C221 Intrahepatic bile duct carcinoma: Secondary | ICD-10-CM

## 2021-06-06 DIAGNOSIS — Z95828 Presence of other vascular implants and grafts: Secondary | ICD-10-CM

## 2021-06-06 LAB — CBC WITH DIFFERENTIAL (CANCER CENTER ONLY)
Abs Immature Granulocytes: 0 10*3/uL (ref 0.00–0.07)
Basophils Absolute: 0 10*3/uL (ref 0.0–0.1)
Basophils Relative: 3 %
Eosinophils Absolute: 0.1 10*3/uL (ref 0.0–0.5)
Eosinophils Relative: 9 %
HCT: 28.5 % — ABNORMAL LOW (ref 39.0–52.0)
Hemoglobin: 9.5 g/dL — ABNORMAL LOW (ref 13.0–17.0)
Immature Granulocytes: 0 %
Lymphocytes Relative: 62 %
Lymphs Abs: 0.7 10*3/uL (ref 0.7–4.0)
MCH: 32.6 pg (ref 26.0–34.0)
MCHC: 33.3 g/dL (ref 30.0–36.0)
MCV: 97.9 fL (ref 80.0–100.0)
Monocytes Absolute: 0.2 10*3/uL (ref 0.1–1.0)
Monocytes Relative: 18 %
Neutro Abs: 0.1 10*3/uL — CL (ref 1.7–7.7)
Neutrophils Relative %: 8 %
Platelet Count: 59 10*3/uL — ABNORMAL LOW (ref 150–400)
RBC: 2.91 MIL/uL — ABNORMAL LOW (ref 4.22–5.81)
RDW: 14.3 % (ref 11.5–15.5)
WBC Count: 1 10*3/uL — ABNORMAL LOW (ref 4.0–10.5)
nRBC: 2 % — ABNORMAL HIGH (ref 0.0–0.2)

## 2021-06-06 LAB — CMP (CANCER CENTER ONLY)
ALT: 26 U/L (ref 0–44)
AST: 40 U/L (ref 15–41)
Albumin: 3.3 g/dL — ABNORMAL LOW (ref 3.5–5.0)
Alkaline Phosphatase: 459 U/L — ABNORMAL HIGH (ref 38–126)
Anion gap: 15 (ref 5–15)
BUN: 24 mg/dL — ABNORMAL HIGH (ref 8–23)
CO2: 26 mmol/L (ref 22–32)
Calcium: 9.1 mg/dL (ref 8.9–10.3)
Chloride: 97 mmol/L — ABNORMAL LOW (ref 98–111)
Creatinine: 0.96 mg/dL (ref 0.61–1.24)
GFR, Estimated: >60 mL/min (ref >60)
Glucose, Bld: 172 mg/dL — ABNORMAL HIGH (ref 70–99)
Potassium: 3.2 mmol/L — ABNORMAL LOW (ref 3.5–5.1)
Sodium: 138 mmol/L (ref 135–145)
Total Bilirubin: 0.8 mg/dL (ref 0.3–1.2)
Total Protein: 6.9 g/dL (ref 6.5–8.1)

## 2021-06-06 LAB — MAGNESIUM: Magnesium: 1.4 mg/dL — ABNORMAL LOW (ref 1.7–2.4)

## 2021-06-06 MED ORDER — POTASSIUM CHLORIDE CRYS ER 20 MEQ PO TBCR: 20.0000 meq | EXTENDED_RELEASE_TABLET | Freq: Every day | ORAL | 0 refills | Status: DC

## 2021-06-06 MED ORDER — SODIUM CHLORIDE 0.9% FLUSH
10.0000 mL | Freq: Once | INTRAVENOUS | Status: AC
  Administered 2021-06-06: 10:00:00 10 mL via INTRAVENOUS

## 2021-06-06 MED ORDER — HEPARIN SOD (PORK) LOCK FLUSH 100 UNIT/ML IV SOLN
500.0000 [IU] | Freq: Once | INTRAVENOUS | Status: AC
  Administered 2021-06-06: 12:00:00 500 [IU] via INTRAVENOUS

## 2021-06-06 MED ORDER — CIPROFLOXACIN HCL 500 MG PO TABS: 500.0000 mg | ORAL_TABLET | Freq: Two times a day (BID) | ORAL | 0 refills | Status: DC

## 2021-06-06 MED ORDER — SODIUM CHLORIDE 0.9% FLUSH
10.0000 mL | INTRAVENOUS | Status: DC | PRN
  Administered 2021-06-06: 12:00:00 10 mL via INTRAVENOUS

## 2021-06-06 NOTE — Addendum Note (Signed)
Addended by: Lenox Ponds E on: 06/06/2021 11:36 AM   Modules accepted: Orders

## 2021-06-06 NOTE — Progress Notes (Signed)
[place] OFFICE PROGRESS NOTE   Diagnosis: Cholangiocarcinoma  INTERVAL HISTORY:   Erik Fletcher completed another cycle of FOLFOX on 05/24/2021.  No nausea/vomiting, mouth sores, or diarrhea.  He has intermittent cold sensitivity.  No peripheral numbness.  He continues to have malaise.  He stays in bed most of the time.  He is eating.  He has persistent "dizziness "when standing.  Objective:  Vital signs in last 24 hours:  Blood pressure 90/60, pulse 98, temperature 98.1 F (36.7 C), resp. rate 20, height 5' 8"  (1.727 m), weight 174 lb (78.9 kg), SpO2 98 %.    HEENT: No thrush or ulcers Resp: Lungs clear bilaterally Cardio: Regular rate and rhythm GI: The liver edge is palpable throughout the right upper abdomen Vascular: Trace edema at the left greater than right lower leg  Skin: Palms without erythema  Portacath/PICC-without erythema  Lab Results:  Lab Results  Component Value Date   WBC 1.0 (L) 06/06/2021   HGB 9.5 (L) 06/06/2021   HCT 28.5 (L) 06/06/2021   MCV 97.9 06/06/2021   PLT 59 (L) 06/06/2021   NEUTROABS 0.1 (LL) 06/06/2021    CMP  Lab Results  Component Value Date   NA 138 06/06/2021   K 3.2 (L) 06/06/2021   CL 97 (L) 06/06/2021   CO2 26 06/06/2021   GLUCOSE 172 (H) 06/06/2021   BUN 24 (H) 06/06/2021   CREATININE 0.96 06/06/2021   CALCIUM 9.1 06/06/2021   PROT 6.9 06/06/2021   ALBUMIN 3.3 (L) 06/06/2021   AST 40 06/06/2021   ALT 26 06/06/2021   ALKPHOS 459 (H) 06/06/2021   BILITOT 0.8 06/06/2021   GFRNONAA >60 06/06/2021   GFRAA >60 10/11/2019    Lab Results  Component Value Date   CEA 54.5 (H) 12/01/2020   OFB510 200,030 (H) 05/24/2021     Medications: I have reviewed the patient's current medications.   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 Cycle 1 FOLFOX 03/06/2021 Cycle 2 FOLFOX 03/21/2021 Cycle 3 FOLFOX 05/24/2021 Glaucoma Anorexia/weight loss secondary to #1 Pain secondary to #1 Port-A-Cath placement 12/27/2020 TSH markedly elevated 05/04/2021-Euthyrox increased from 50 mcg to 100 mcg Severe neutropenia following cycle 3 FOLFOX, chemotherapy held 06/06/2021, Udenyca added with cycle 4       Disposition: Erik Fletcher has completed 3 cycles of FOLFOX.  He continues to have a poor performance status.  He has developed severe neutropenia and moderate thrombocytopenia.  He will call for a fever or symptoms of an infection.  He will call for bleeding.  Chemotherapy will be held today.  The chemotherapy will be dose reduced and Udenyca will be added with cycle 4.  He will return for an office visit and chemotherapy in 1 week.  He wishes to continue  chemotherapy.  He will be referred for a restaging CT evaluation after cycle 4 or cycle 5 FOLFOX.  We will follow-up on the CA 19-9 from today.  Betsy Coder, MD  06/06/2021  11:05 AM

## 2021-06-06 NOTE — Progress Notes (Deleted)
[facility] OFFICE PROGRESS NOTE   Diagnosis:   INTERVAL HISTORY:   ***  Objective:  Vital signs in last 24 hours:  Blood pressure 90/60, pulse 98, temperature 98.1 F (36.7 C), resp. rate 20, height $RemoveBe'5\' 8"'smfoBrIcq$  (1.727 m), weight 174 lb (78.9 kg), SpO2 98 %.    HEENT: *** Lymphatics: *** Resp: *** Cardio: *** GI: *** Vascular: *** Neuro:***  Skin:***   Portacath/PICC-without erythema  Lab Results:  Lab Results  Component Value Date   WBC 1.0 (L) 06/06/2021   HGB 9.5 (L) 06/06/2021   HCT 28.5 (L) 06/06/2021   MCV 97.9 06/06/2021   PLT 59 (L) 06/06/2021   NEUTROABS 0.1 (LL) 06/06/2021    CMP  Lab Results  Component Value Date   NA 138 06/06/2021   K 3.2 (L) 06/06/2021   CL 97 (L) 06/06/2021   CO2 26 06/06/2021   GLUCOSE 172 (H) 06/06/2021   BUN 24 (H) 06/06/2021   CREATININE 0.96 06/06/2021   CALCIUM 9.1 06/06/2021   PROT 6.9 06/06/2021   ALBUMIN 3.3 (L) 06/06/2021   AST 40 06/06/2021   ALT 26 06/06/2021   ALKPHOS 459 (H) 06/06/2021   BILITOT 0.8 06/06/2021   GFRNONAA >60 06/06/2021   GFRAA >60 10/11/2019    Lab Results  Component Value Date   CEA 54.5 (H) 12/01/2020   ZOX096 200,030 (H) 05/24/2021    Lab Results  Component Value Date   INR 1.0 12/11/2020   LABPROT 13.4 12/11/2020    Imaging:  No results found.  Medications: I have reviewed the patient's current medications.   Assessment/Plan: Intrahepatic cholangiocarcinoma, clinical stage IIIb (T2N1M0) Abdominal ultrasound 11/24/2020-by lobar hepatic lesions including a large infiltrative right liver lesion and heterogenous mass superior to the pancreas head MRI abdomen 11/29/2020-numerous hypoenhancing by lobar liver lesions suggestive of cholangiocarcinoma, multiple abnormal portacaval and celiac axis/gastropathic ligament nodes consistent with metastatic disease Ultrasound-guided biopsy of a right liver lesion 12/11/2020-adenocarcinoma, immunohistochemical profile and  morphology consistent with a pancreaticobiliary primary MSS, tumor mutation burden-1, no FGFR2 alteration Markedly elevated CA 19-9 CT 12/20/2020-numerous right and left liver lesions, numerous portacaval, celiac, and gastropathic ligament nodes, no evidence of metastatic disease in the chest Cycle 1 gemcitabine/cisplatin/Durvalumab 12/28/2020 Cycle 2 gemcitabine/cisplatin/Durvalumab 01/19/2021 Cycle 3 gemcitabine/cisplatin/Durvalumab 02/09/2021, day 8 held due to hypotension/dehydration CTs 02/27/2021-new and enlarging hepatic metastatic disease and enlarging periportal/gastrohepatic ligament adenopathy. Cycle 1 FOLFOX 03/06/2021 Cycle 2 FOLFOX 03/21/2021 Cycle 3 FOLFOX 05/24/2021 Glaucoma Anorexia/weight loss secondary to #1 Pain secondary to #1 Port-A-Cath placement 12/27/2020 TSH markedly elevated 05/04/2021-Euthyrox increased from 50 mcg to 100 mcg    Disposition: ***  Betsy Coder, MD  06/06/2021  10:44 AM

## 2021-06-06 NOTE — Telephone Encounter (Signed)
CRITICAL VALUE STICKER  CRITICAL VALUE: ANC 0.1  RECEIVER (on-site recipient of call): Lenox Ponds LPN  DATE & TIME NOTIFIED: 06/06/21  MESSENGER (representative from lab): [facility]  MD NOTIFIED: Dr Benay Spice   TIME OF NOTIFICATION: [id number]  RESPONSE: Provider aware

## 2021-06-07 ENCOUNTER — Other Ambulatory Visit: Payer: Self-pay

## 2021-06-07 DIAGNOSIS — C221 Intrahepatic bile duct carcinoma: Secondary | ICD-10-CM

## 2021-06-07 LAB — CANCER ANTIGEN 19-9: CA 19-9: 97813 U/mL — ABNORMAL HIGH (ref 0–35)

## 2021-06-07 MED ORDER — MAGNESIUM OXIDE -MG SUPPLEMENT 400 (240 MG) MG PO TABS: 400.0000 mg | ORAL_TABLET | Freq: Two times a day (BID) | ORAL | 0 refills | Status: DC

## 2021-06-08 ENCOUNTER — Inpatient Hospital Stay: Payer: Medicare Other

## 2021-06-14 ENCOUNTER — Inpatient Hospital Stay: Payer: Medicare Other

## 2021-06-14 ENCOUNTER — Inpatient Hospital Stay: Payer: Medicare Other | Admitting: Oncology

## 2021-06-16 ENCOUNTER — Inpatient Hospital Stay: Payer: Medicare Other

## 2021-06-19 ENCOUNTER — Emergency Department (HOSPITAL_COMMUNITY): Payer: Medicare Other

## 2021-06-19 ENCOUNTER — Encounter (HOSPITAL_COMMUNITY): Payer: Self-pay | Admitting: Emergency Medicine

## 2021-06-19 ENCOUNTER — Inpatient Hospital Stay (HOSPITAL_COMMUNITY)
Admission: EM | Admit: 2021-06-19 | Discharge: 2021-06-29 | DRG: 435 | Disposition: A | Discharge status: Hospice | Payer: Medicare Other | Attending: Family Medicine | Admitting: Family Medicine

## 2021-06-19 DIAGNOSIS — H1089 Other conjunctivitis: Secondary | ICD-10-CM | POA: Diagnosis present

## 2021-06-19 DIAGNOSIS — Z79899 Other long term (current) drug therapy: Secondary | ICD-10-CM | POA: Diagnosis not present

## 2021-06-19 DIAGNOSIS — R55 Syncope and collapse: Secondary | ICD-10-CM | POA: Diagnosis not present

## 2021-06-19 DIAGNOSIS — Z9889 Other specified postprocedural states: Secondary | ICD-10-CM

## 2021-06-19 DIAGNOSIS — J9811 Atelectasis: Secondary | ICD-10-CM | POA: Diagnosis present

## 2021-06-19 DIAGNOSIS — H409 Unspecified glaucoma: Secondary | ICD-10-CM | POA: Diagnosis present

## 2021-06-19 DIAGNOSIS — J90 Pleural effusion, not elsewhere classified: Secondary | ICD-10-CM | POA: Diagnosis present

## 2021-06-19 DIAGNOSIS — C221 Intrahepatic bile duct carcinoma: Secondary | ICD-10-CM | POA: Diagnosis present

## 2021-06-19 DIAGNOSIS — A480 Gas gangrene: Secondary | ICD-10-CM

## 2021-06-19 DIAGNOSIS — Z20822 Contact with and (suspected) exposure to covid-19: Secondary | ICD-10-CM | POA: Diagnosis present

## 2021-06-19 DIAGNOSIS — Z515 Encounter for palliative care: Secondary | ICD-10-CM | POA: Diagnosis not present

## 2021-06-19 DIAGNOSIS — R627 Adult failure to thrive: Secondary | ICD-10-CM | POA: Diagnosis present

## 2021-06-19 DIAGNOSIS — R296 Repeated falls: Secondary | ICD-10-CM | POA: Diagnosis present

## 2021-06-19 DIAGNOSIS — E872 Acidosis, unspecified: Secondary | ICD-10-CM | POA: Diagnosis present

## 2021-06-19 DIAGNOSIS — Z7401 Bed confinement status: Secondary | ICD-10-CM

## 2021-06-19 DIAGNOSIS — G9341 Metabolic encephalopathy: Secondary | ICD-10-CM | POA: Diagnosis present

## 2021-06-19 DIAGNOSIS — L03114 Cellulitis of left upper limb: Secondary | ICD-10-CM | POA: Diagnosis present

## 2021-06-19 DIAGNOSIS — Z8249 Family history of ischemic heart disease and other diseases of the circulatory system: Secondary | ICD-10-CM

## 2021-06-19 DIAGNOSIS — E119 Type 2 diabetes mellitus without complications: Secondary | ICD-10-CM | POA: Diagnosis present

## 2021-06-19 DIAGNOSIS — R54 Age-related physical debility: Secondary | ICD-10-CM | POA: Diagnosis present

## 2021-06-19 DIAGNOSIS — Z66 Do not resuscitate: Secondary | ICD-10-CM | POA: Diagnosis not present

## 2021-06-19 DIAGNOSIS — R531 Weakness: Secondary | ICD-10-CM

## 2021-06-19 DIAGNOSIS — H109 Unspecified conjunctivitis: Secondary | ICD-10-CM | POA: Diagnosis not present

## 2021-06-19 DIAGNOSIS — E039 Hypothyroidism, unspecified: Secondary | ICD-10-CM | POA: Diagnosis present

## 2021-06-19 DIAGNOSIS — J309 Allergic rhinitis, unspecified: Secondary | ICD-10-CM | POA: Diagnosis present

## 2021-06-19 DIAGNOSIS — W1830XA Fall on same level, unspecified, initial encounter: Secondary | ICD-10-CM | POA: Diagnosis present

## 2021-06-19 DIAGNOSIS — Z7984 Long term (current) use of oral hypoglycemic drugs: Secondary | ICD-10-CM | POA: Diagnosis not present

## 2021-06-19 DIAGNOSIS — I951 Orthostatic hypotension: Secondary | ICD-10-CM | POA: Diagnosis present

## 2021-06-19 DIAGNOSIS — D72823 Leukemoid reaction: Secondary | ICD-10-CM | POA: Diagnosis present

## 2021-06-19 DIAGNOSIS — F05 Delirium due to known physiological condition: Secondary | ICD-10-CM | POA: Diagnosis not present

## 2021-06-19 DIAGNOSIS — Z6826 Body mass index (BMI) 26.0-26.9, adult: Secondary | ICD-10-CM

## 2021-06-19 DIAGNOSIS — Z23 Encounter for immunization: Secondary | ICD-10-CM | POA: Diagnosis present

## 2021-06-19 DIAGNOSIS — R4182 Altered mental status, unspecified: Secondary | ICD-10-CM

## 2021-06-19 DIAGNOSIS — M7989 Other specified soft tissue disorders: Secondary | ICD-10-CM | POA: Diagnosis not present

## 2021-06-19 DIAGNOSIS — W19XXXA Unspecified fall, initial encounter: Secondary | ICD-10-CM

## 2021-06-19 LAB — RESP PANEL BY RT-PCR (FLU A&B, COVID) ARPGX2
Influenza A by PCR: NEGATIVE
Influenza B by PCR: NEGATIVE
SARS Coronavirus 2 by RT PCR: NEGATIVE

## 2021-06-19 LAB — CBC WITH DIFFERENTIAL/PLATELET
Abs Immature Granulocytes: 0.19 10*3/uL — ABNORMAL HIGH (ref 0.00–0.07)
Basophils Absolute: 0.1 10*3/uL (ref 0.0–0.1)
Basophils Relative: 1 %
Eosinophils Absolute: 0 10*3/uL (ref 0.0–0.5)
Eosinophils Relative: 0 %
HCT: 30 % — ABNORMAL LOW (ref 39.0–52.0)
Hemoglobin: 10 g/dL — ABNORMAL LOW (ref 13.0–17.0)
Immature Granulocytes: 1 %
Lymphocytes Relative: 5 %
Lymphs Abs: 1 10*3/uL (ref 0.7–4.0)
MCH: 33.3 pg (ref 26.0–34.0)
MCHC: 33.3 g/dL (ref 30.0–36.0)
MCV: 100 fL (ref 80.0–100.0)
Monocytes Absolute: 2 10*3/uL — ABNORMAL HIGH (ref 0.1–1.0)
Monocytes Relative: 9 %
Neutro Abs: 17.8 10*3/uL — ABNORMAL HIGH (ref 1.7–7.7)
Neutrophils Relative %: 84 %
Platelets: 279 10*3/uL (ref 150–400)
RBC: 3 MIL/uL — ABNORMAL LOW (ref 4.22–5.81)
RDW: 15.8 % — ABNORMAL HIGH (ref 11.5–15.5)
WBC: 21.2 10*3/uL — ABNORMAL HIGH (ref 4.0–10.5)
nRBC: 0 % (ref 0.0–0.2)

## 2021-06-19 LAB — CK: Total CK: 59 U/L (ref 49–397)

## 2021-06-19 LAB — MAGNESIUM: Magnesium: 1.8 mg/dL (ref 1.7–2.4)

## 2021-06-19 LAB — COMPREHENSIVE METABOLIC PANEL
ALT: 21 U/L (ref 0–44)
AST: 46 U/L — ABNORMAL HIGH (ref 15–41)
Albumin: 2.5 g/dL — ABNORMAL LOW (ref 3.5–5.0)
Alkaline Phosphatase: 440 U/L — ABNORMAL HIGH (ref 38–126)
Anion gap: 12 (ref 5–15)
BUN: 14 mg/dL (ref 8–23)
CO2: 24 mmol/L (ref 22–32)
Calcium: 8.5 mg/dL — ABNORMAL LOW (ref 8.9–10.3)
Chloride: 98 mmol/L (ref 98–111)
Creatinine, Ser: 0.85 mg/dL (ref 0.61–1.24)
GFR, Estimated: >60 mL/min (ref >60)
Glucose, Bld: 93 mg/dL (ref 70–99)
Potassium: 3.9 mmol/L (ref 3.5–5.1)
Sodium: 134 mmol/L — ABNORMAL LOW (ref 135–145)
Total Bilirubin: 1 mg/dL (ref 0.3–1.2)
Total Protein: 6.1 g/dL — ABNORMAL LOW (ref 6.5–8.1)

## 2021-06-19 LAB — TROPONIN I (HIGH SENSITIVITY)
Troponin I (High Sensitivity): 11 ng/L (ref <18)
Troponin I (High Sensitivity): 10 ng/L (ref <18)

## 2021-06-19 LAB — PROTIME-INR
INR: 1.1 (ref 0.8–1.2)
Prothrombin Time: 14.6 seconds (ref 11.4–15.2)

## 2021-06-19 LAB — LACTIC ACID, PLASMA: Lactic Acid, Venous: 4 mmol/L (ref 0.5–1.9)

## 2021-06-19 MED ORDER — ERYTHROMYCIN 5 MG/GM OP OINT
TOPICAL_OINTMENT | Freq: Once | OPHTHALMIC | Status: DC
  Filled 2021-06-19: qty 4

## 2021-06-19 MED ORDER — TETANUS-DIPHTH-ACELL PERTUSSIS 5-2.5-18.5 LF-MCG/0.5 IM SUSY
0.5000 mL | PREFILLED_SYRINGE | Freq: Once | INTRAMUSCULAR | Status: AC
  Administered 2021-06-19: 20:00:00 0.5 mL via INTRAMUSCULAR
  Filled 2021-06-19: qty 0.5

## 2021-06-19 MED ORDER — LACTATED RINGERS IV BOLUS (SEPSIS): 1000.0000 mL | Freq: Once | INTRAVENOUS | Status: DC

## 2021-06-19 MED ORDER — LACTATED RINGERS IV SOLN: INTRAVENOUS | Status: DC

## 2021-06-19 MED ORDER — LACTATED RINGERS IV BOLUS
500.0000 mL | Freq: Once | INTRAVENOUS | Status: AC
  Administered 2021-06-19: 20:00:00 500 mL via INTRAVENOUS

## 2021-06-19 MED ORDER — VANCOMYCIN HCL 1750 MG/350ML IV SOLN
1750.0000 mg | Freq: Once | INTRAVENOUS | Status: AC
  Administered 2021-06-20: 1750 mg via INTRAVENOUS
  Filled 2021-06-19: qty 350

## 2021-06-19 MED ORDER — VANCOMYCIN HCL IN DEXTROSE 1-5 GM/200ML-% IV SOLN: 1000.0000 mg | Freq: Once | INTRAVENOUS | Status: DC

## 2021-06-19 MED ORDER — IOHEXOL 350 MG/ML SOLN
60.0000 mL | Freq: Once | INTRAVENOUS | Status: AC | PRN
  Administered 2021-06-19: 19:00:00 60 mL via INTRAVENOUS

## 2021-06-19 MED ORDER — OXYCODONE HCL 5 MG PO TABS
10.0000 mg | ORAL_TABLET | Freq: Once | ORAL | Status: AC
  Administered 2021-06-20: 22:00:00 10 mg via ORAL
  Filled 2021-06-19: qty 2

## 2021-06-19 MED ORDER — SODIUM CHLORIDE 0.9 % IV SOLN: 2.0000 g | Freq: Once | INTRAVENOUS | Status: DC

## 2021-06-19 NOTE — ED Notes (Signed)
EDP at the bedside.  ?

## 2021-06-19 NOTE — ED Provider Notes (Signed)
[facility] DEPT Provider Note   CSN: [id number] Arrival date & time: 06/19/21  1814     History Chief Complaint  Patient presents with   Erik Fletcher. is a 70 y.o. male.   Fall Pertinent negatives include no chest pain, no abdominal pain, no headaches and no shortness of breath. Patient presents for generalized weakness.  He has a history of cholangiocarcinoma.  He is currently undergoing FOLFOX chemotherapy.  His last chemotherapy session was on 12/1.  He had a scheduled infusion on 12/14 but this was deferred due to his leukopenia.  Patient reports increasing frequency of mechanical falls since he started on chemotherapy.  2 days ago, he had a fall backwards, landing on his back.  Since that time, he has had pain under the area of his left scapula.  He has also had generalized weakness which has prevented him from getting up out of bed without assistance.  He denies any shortness of breath, cough, vomiting, or diarrhea.  He has had decreased appetite and p.o. intake.     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   Goals of care, counseling/discussion 12/15/2020   Lumbar disc disease with radiculopathy 06/27/2015    Past Surgical History:  Procedure Laterality Date   BACK SURGERY     09/2009   BACK SURGERY  2013   CERVICAL SPINE SURGERY     2011   EYE SURGERY     09/2011 left eye   IR IMAGING GUIDED PORT INSERTION  12/27/2020   SHOULDER SURGERY     right   3/83       Family History  Problem Relation Age of Onset   Heart disease Mother    Dementia Father    Heart disease Father    Prostate cancer Brother    Other Daughter    Other Daughter    Colon cancer Neg Hx    Colon polyps Neg Hx    Esophageal cancer Neg Hx    Rectal cancer Neg Hx    Stomach cancer Neg Hx     Social History   Tobacco Use   Smoking status: Never   Smokeless tobacco: Never  Vaping Use   Vaping Use: Never used  Substance Use Topics   Alcohol use: No   Drug use: No    Home Medications Prior to Admission medications   Medication Sig Start Date End Date Taking? Authorizing Provider  acetaminophen (TYLENOL) 500 MG tablet Take 1,000 mg by mouth every 6 (six) hours as needed for moderate pain or headache. Patient not taking: Reported on 05/11/2021    [provider]  ciprofloxacin (CIPRO) 500 MG tablet Take 1 tablet (500 mg total) by mouth 2 (two) times daily. 06/06/21   Ladell Pier, MD  COMBIGAN 0.2-0.5 % ophthalmic solution Place 1 drop into the right eye 2 (two) times daily. 09/09/19   [provider]  EUTHYROX 50 MCG tablet Take 50 mcg by mouth  every morning. 10/31/20   [provider]  glucose monitoring kit (FREESTYLE) monitoring kit 1 each by Does not apply route as needed for other. 10/12/19   Rancour, Annie Main, MD  HYDROcodone-acetaminophen (NORCO) 5-325 MG tablet Take 0.5-1 tablets by mouth every 6 (six) hours as needed for moderate pain. Patient not taking: Reported on 05/11/2021 03/16/21   Owens Shark, NP  ibuprofen (ADVIL) 200 MG tablet Take 400 mg by mouth every 6 (six) hours as needed for headache or moderate pain. Patient not taking: Reported on 02/16/2021    [provider]  latanoprost (XALATAN) 0.005 % ophthalmic solution Place 1 drop into the right eye at bedtime.     [provider]  lidocaine-prilocaine (EMLA) cream Apply 1 application topically as directed. Apply to port site 2 hours prior  to stick and cover with plastic wrap to numb site 12/20/20   Ladell Pier, MD  magnesium oxide (MAG-OX) 400 (240 Mg) MG tablet Take 1 tablet (400 mg total) by mouth 2 (two) times daily. 06/07/21   Ladell Pier, MD  metFORMIN (GLUCOPHAGE) 500 MG tablet Take 1 tablet (500 mg total) by mouth 2 (two) times daily with a meal. 10/12/19   Rancour, Annie Main, MD  midodrine (PROAMATINE) 5 MG tablet Take 1 tablet (5 mg total) by mouth 2 (two) times daily with a meal. Take 5 mg at 8 am and take 5 mg at 2 pm 05/04/21   Ladell Pier, MD  Multiple Vitamin (MULTIVITAMIN PO) Take 1 tablet by mouth daily.    [provider]  ondansetron (ZOFRAN) 8 MG tablet Take 1 tablet (8 mg total) by mouth every 8 (eight) hours as needed for nausea or vomiting. Start 72 hours after each IV chemotherapy treatment Patient not taking: Reported on 06/06/2021 12/20/20   Ladell Pier, MD  phenylephrine (4-WAY FAST ACTING) 1 % nasal spray Place 1 drop into both nostrils daily as needed for congestion.    [provider]  Polyethyl Glyc-Propyl Glyc PF (SYSTANE ULTRA PF) 0.4-0.3 % SOLN Place 1 drop into both eyes daily as needed (dryness).    [provider]  potassium chloride SA (KLOR-CON M) 20 MEQ tablet Take 1 tablet (20 mEq total) by mouth daily. 06/06/21   Ladell Pier, MD  prochlorperazine (COMPAZINE) 10 MG tablet Take 1 tablet (10 mg total) by mouth every 6 (six) hours as needed for nausea or vomiting. Patient not taking: Reported on 03/23/2021 12/20/20   Ladell Pier, MD  RESTASIS 0.05 % ophthalmic emulsion Place 1 drop into both eyes 2 (two) times daily. 04/17/15   [provider]  triamcinolone cream (KENALOG) 0.1 % Apply 1 application topically in the morning and at bedtime. Patient not taking: Reported on 03/02/2021 12/04/20   [provider]    Allergies    Patient has no known allergies.  Review of Systems   Review of Systems  Constitutional:  Positive for  activity change, appetite change and fatigue. Negative for chills and fever.  HENT:  Negative for ear pain and sore throat.   Eyes:  Negative for pain and visual disturbance.  Respiratory:  Negative for cough and shortness of breath.   Cardiovascular:  Negative for chest pain and palpitations.  Gastrointestinal:  Negative for abdominal distention, abdominal pain, diarrhea, nausea and vomiting.  Genitourinary:  Negative for dysuria and hematuria.  Musculoskeletal:  Positive for back pain and gait problem. Negative for arthralgias, joint swelling, myalgias and neck pain.  Skin:  Positive for wound. Negative for color change and rash.  Neurological:  Positive for weakness (Generalized). Negative for dizziness, seizures, syncope, facial asymmetry, speech difficulty, light-headedness and headaches.  Hematological:  Does not bruise/bleed easily.  All other systems reviewed and are negative.  Physical Exam Updated Vital Signs BP (!) 149/101 (BP Location: Left Arm)    Pulse 84    Temp 98.2 F (36.8 C) (Oral)    Resp 16    SpO2 98%   Physical Exam Vitals and nursing note reviewed.  Constitutional:      General: He is not in acute distress.    Appearance: Normal appearance. He is well-developed. He is ill-appearing (Chronically). He is not toxic-appearing or diaphoretic.  HENT:     Head: Normocephalic and atraumatic.     Right Ear: External ear normal.     Left Ear: External ear normal.     Nose: Nose normal.     Mouth/Throat:     Mouth: Mucous membranes are dry.     Pharynx: Oropharynx is clear.  Eyes:     General: No scleral icterus.    Extraocular Movements: Extraocular movements intact.     Conjunctiva/sclera: Conjunctivae normal.  Cardiovascular:     Rate and Rhythm: Normal rate and regular rhythm.     Heart sounds: No murmur heard. Pulmonary:     Effort: Pulmonary effort is normal. No respiratory distress.     Breath sounds: Normal breath sounds. No wheezing or rales.  Chest:      Chest wall: No tenderness.  Abdominal:     Palpations: Abdomen is soft.     Tenderness: There is no abdominal tenderness. There is no right CVA tenderness.  Musculoskeletal:        General: Tenderness (Left subscapular area) present. No swelling.     Cervical back: Normal range of motion and neck supple. No rigidity.     Right lower leg: No edema.     Left lower leg: No edema.  Skin:    General: Skin is warm and dry.     Capillary Refill: Capillary refill takes less than 2 seconds.     Coloration: Skin is not jaundiced or pale.  Neurological:     General: No focal deficit present.     Mental Status: He is alert. He is disoriented.     Cranial Nerves: No cranial nerve deficit.     Sensory: No sensory deficit.     Motor: No weakness.     Comments: Patient alert to person and place only  Psychiatric:        Mood and Affect: Mood normal.        Behavior: Behavior normal.    ED Results / Procedures / Treatments   Labs (all labs ordered are listed, but only abnormal results are displayed) Labs Reviewed  COMPREHENSIVE METABOLIC PANEL - Abnormal; Notable for the following components:      Result Value   Sodium 134 (*)    Calcium 8.5 (*)    Total Protein 6.1 (*)    Albumin 2.5 (*)    AST 46 (*)    Alkaline Phosphatase 440 (*)    All other components within normal limits  CBC WITH DIFFERENTIAL/PLATELET - Abnormal; Notable for the following components:   WBC 21.2 (*)    RBC 3.00 (*)    Hemoglobin 10.0 (*)    HCT 30.0 (*)    RDW 15.8 (*)    Neutro Abs 17.8 (*)    Monocytes Absolute  2.0 (*)    Abs Immature Granulocytes 0.19 (*)    All other components within normal limits  LACTIC ACID, PLASMA - Abnormal; Notable for the following components:   Lactic Acid, Venous 4.0 (*)    All other components within normal limits  LACTIC ACID, PLASMA - Abnormal; Notable for the following components:   Lactic Acid, Venous 4.3 (*)    All other components within normal limits  RESP PANEL BY  RT-PCR (FLU A&B, COVID) ARPGX2  CULTURE, BLOOD (ROUTINE X 2)  CULTURE, BLOOD (ROUTINE X 2)  URINE CULTURE  PROTIME-INR  CK  MAGNESIUM  URINALYSIS, ROUTINE W REFLEX MICROSCOPIC  CBC  BASIC METABOLIC PANEL  STREP PNEUMONIAE URINARY ANTIGEN  LEGIONELLA PNEUMOPHILA SEROGP 1 UR AG  TSH  TROPONIN I (HIGH SENSITIVITY)  TROPONIN I (HIGH SENSITIVITY)    EKG EKG Interpretation  Date/Time:  Tuesday June 19 2021 19:58:31 EST Ventricular Rate:  83 PR Interval:    QRS Duration: 103 QT Interval:  387 QTC Calculation: 455 R Axis:   -37 Text Interpretation: sinus rhythm Left axis deviation Low voltage, extremity and precordial leads Confirmed by Godfrey Pick [phone]) on 06/19/2021 8:47:45 PM  Radiology DG Pelvis 1-2 Views  Result Date: 06/19/2021 CLINICAL DATA:  Mechanical fall after syncopal episode. EXAM: PELVIS - 1-2 VIEW COMPARISON:  None. FINDINGS: Residual contrast material in the bladder. Postoperative changes in the lower lumbar spine. Degenerative changes in both hips with narrowed acetabular joint and sclerosis and osteophyte formation. No evidence of acute fracture or dislocation. No focal bone lesion or bone destruction. Bone cortex appears intact. IMPRESSION: Degenerative changes in both hips.  No acute bony abnormalities. Electronically Signed   By: Lucienne Capers M.D.   On: 06/19/2021 20:56   CT HEAD WO CONTRAST  Result Date: 06/19/2021 CLINICAL DATA:  Head trauma EXAM: CT HEAD WITHOUT CONTRAST TECHNIQUE: Contiguous axial images were obtained from the base of the skull through the vertex without intravenous contrast. COMPARISON:  None. FINDINGS: Brain: There is no mass, hemorrhage or extra-axial collection. There is generalized atrophy without lobar predilection. Hypodensity of the white matter is most commonly associated with chronic microvascular disease. There are calcifications within the corona radiata and basal ganglia Vascular: No abnormal hyperdensity of the major  intracranial arteries or dural venous sinuses. No intracranial atherosclerosis. Skull: The visualized skull base, calvarium and extracranial soft tissues are normal. Sinuses/Orbits: No fluid levels or advanced mucosal thickening of the visualized paranasal sinuses. No mastoid or middle ear effusion. The orbits are normal. IMPRESSION: 1. No acute intracranial abnormality. 2. Atrophy and chronic microvascular ischemia. Cerebral Atrophy (ICD10-G31.9). Electronically Signed   By: Ulyses Jarred M.D.   On: 06/19/2021 20:02   CT CHEST W CONTRAST  Result Date: 06/19/2021 CLINICAL DATA:  Chest trauma, blunt. Syncopal episode with fall. Bilateral flank pain and LEFT shoulder pain. Back pain. EXAM: CT CHEST WITH CONTRAST TECHNIQUE: Multidetector CT imaging of the chest was performed during intravenous contrast administration. CONTRAST:  70m OMNIPAQUE IOHEXOL 350 MG/ML SOLN COMPARISON:  Chest CT dated 02/27/2021. FINDINGS: Cardiovascular: Thoracic aorta appears intact and stable in configuration. Scattered atherosclerosis of the thoracic aorta and coronary arteries. No significant pericardial effusion. Mediastinum/Nodes: No mass or enlarged lymph nodes are seen within the mediastinum. Esophagus is somewhat patulous, containing recently ingested material, but otherwise unremarkable. Trachea and central bronchi are unremarkable. Lungs/Pleura: Moderate-sized LEFT pleural effusion with overlying compressive atelectasis. Mild atelectasis versus pleural thickening at the RIGHT lung base. No pneumothorax. Upper Abdomen: Hypodense lesions/masses throughout the  liver, compatible with previously described hepatic metastases. Scattered areas of free fluid/ascites within the upper abdomen. Musculoskeletal: Degenerative spondylosis of the kyphotic thoracic spine, mild to moderate in degree. Anterior cervical fusion hardware within the lower cervical spine. No acute-appearing osseous abnormality. IMPRESSION: 1. Moderate-sized LEFT  pleural effusion with overlying compressive atelectasis, new compared to the earlier chest CT of 02/27/2021. 2. Known hepatic metastases, not significantly changed compared to CT of 02/27/2021. Also similar appearance of the periportal/gastrohepatic ligament adenopathy that was also described on the earlier CT. 3. Scattered free fluid/ascites within the upper abdomen. 4. No acute osseous fracture or dislocation identified. 5. Debris versus recently ingested material within the upper esophagus. This presents a risk for potential aspiration. Aortic Atherosclerosis (ICD10-I70.0). Electronically Signed   By: Franki Cabot M.D.   On: 06/19/2021 20:33   CT CERVICAL SPINE WO CONTRAST  Result Date: 06/19/2021 CLINICAL DATA:  Neck trauma, EXAM: CT CERVICAL SPINE WITHOUT CONTRAST TECHNIQUE: Multidetector CT imaging of the cervical spine was performed without intravenous contrast. Multiplanar CT image reconstructions were also generated. COMPARISON:  None. FINDINGS: Alignment: Moderate dextroscoliosis of the cervical spine. No evidence of acute vertebral body subluxation. Skull base and vertebrae: No fracture line or displaced fracture fragment is seen. Anterior cervical fusion hardware at C5 through C7 appears intact and appropriately positioned. Soft tissues and spinal canal: No prevertebral fluid or swelling. No visible canal hematoma. Disc levels: Mild degenerative spurring at multiple levels, but no more than mild central canal stenosis at any level. Upper chest: Debris versus recently ingested material within the upper esophagus. Limited images of the upper chest are otherwise unremarkable. Other: Bilateral carotid atherosclerosis. IMPRESSION: 1. No acute findings within the cervical spine. No osseous fracture or dislocation. No evidence of osseous metastasis. 2. Anterior cervical fusion hardware at C5 through C7 appears intact and appropriately positioned. 3. Moderate scoliosis. 4. Bilateral carotid  atherosclerosis. 5. Debris versus recently ingested material within the upper esophagus. This presents a potential risk for aspiration. Electronically Signed   By: Franki Cabot M.D.   On: 06/19/2021 20:43   CT T-SPINE NO CHARGE  Result Date: 06/19/2021 CLINICAL DATA:  Chest trauma, back pain. EXAM: CT THORACIC SPINE WITHOUT CONTRAST TECHNIQUE: Multidetector CT images of the thoracic were obtained using the standard protocol without intravenous contrast. COMPARISON:  None. FINDINGS: Alignment: Moderate to severe kyphosis of the thoracic spine. No evidence of acute vertebral body subluxation. Vertebrae: No fracture line or displaced fracture fragment. No evidence of acute compression fracture deformity.w Paraspinal and other soft tissues: Moderate-sized LEFT pleural effusion with overlying compressive atelectasis, new compared to a CT chest CT of 02/27/2021. Multiple nonobstructing LEFT renal stones, largest measuring 8 mm. Known hepatic metastases, incompletely imaged. Visualized paraspinal soft tissues are otherwise unremarkable. Disc levels: Degenerative spondylosis throughout the thoracolumbar spine, mild to moderate in degree, with associated mild-to-moderate central canal stenoses at multiple levels. IMPRESSION: 1. No acute findings within the thoracic spine. No acute osseous fracture or dislocation within the thoracic spine. No evidence of osseous metastasis within the thoracic spine. 2. Kyphosis and degenerative changes of the thoracic spine, as detailed above. 3. Moderate-sized LEFT pleural effusion with overlying compressive atelectasis, new compared to a recent chest CT of 02/27/2021. 4. Known hepatic metastases, incompletely imaged. 5. Nonobstructing LEFT renal stones. Electronically Signed   By: Franki Cabot M.D.   On: 06/19/2021 20:39   DG Lumbar Spine 2-3Vclearing  Result Date: 06/19/2021 CLINICAL DATA:  Low back pain after fall. EXAM: LIMITED LUMBAR SPINE FOR  TRAUMA CLEARING - 2-3 VIEW  COMPARISON:  September 13, 2015. FINDINGS: Status post surgical posterior fusion of L3-4 and L4-5 with bilateral intrapedicular screw placement and interbody fusion. No fracture or spondylolisthesis is noted. IMPRESSION: Postsurgical changes as described above.  No acute abnormality seen. Electronically Signed   By: Marijo Conception M.D.   On: 06/19/2021 20:55    Procedures Procedures   Medications Ordered in ED Medications  lactated ringers infusion ( Intravenous New Bag/Given 06/20/21 0004)  lactated ringers bolus 1,000 mL (1,000 mLs Intravenous Not Given 06/20/21 0112)  ceFEPIme (MAXIPIME) 2 g in sodium chloride 0.9 % 100 mL IVPB (has no administration in time range)  erythromycin ophthalmic ointment (1 application Both Eyes Not Given 06/20/21 0124)  vancomycin (VANCOREADY) IVPB 1750 mg/350 mL (1,750 mg Intravenous New Bag/Given 06/20/21 0010)  oxyCODONE (Oxy IR/ROXICODONE) immediate release tablet 10 mg (10 mg Oral Not Given 06/20/21 0123)  erythromycin ophthalmic ointment ( Right Eye Given 06/20/21 0127)  Ampicillin-Sulbactam (UNASYN) 3 g in sodium chloride 0.9 % 100 mL IVPB (has no administration in time range)  midodrine (PROAMATINE) tablet 5 mg (has no administration in time range)  Tdap (BOOSTRIX) injection 0.5 mL (0.5 mLs Intramuscular Given 06/19/21 2001)  lactated ringers bolus 500 mL (0 mLs Intravenous Stopped 06/19/21 2146)  iohexol (OMNIPAQUE) 350 MG/ML injection 60 mL (60 mLs Intravenous Contrast Given 06/19/21 1929)    ED Course  I have reviewed the triage vital signs and the nursing notes.  Pertinent labs & imaging results that were available during my care of the patient were reviewed by me and considered in my medical decision making (see chart for details).    MDM Rules/Calculators/A&P                         Patient is a 70 year old male with cholangiocarcinoma and DM2, presenting for fatigue, generalized weakness, and left upper back pain over the past 2 days.  Patient  has had functional decline over the past several weeks and has had multiple falls.  He feels that he became acutely weak and following a fall 2 days ago during which she landed on his back.  On arrival, patient is able to answer questions appropriately.  He has a difficult time recalling recent events.  He is oriented to person and place only.  I spoke with his wife he states that this is not his baseline.  At baseline, patient is fully alert and oriented.  He has no focal neurologic deficits.  He does endorse discomfort in his left subscapular area.  On exam, this area is mildly tender.  No overlying bruising or erythema is present.  He does have skin tears to his bilateral elbows.  He states that this occurred 2 days ago when he fell and had a prolonged time standing back up.  Skin tear wounds were dressed with Xeroform and Kerlix gauze.  Tetanus was updated.  Given his decreased p.o. intake, IV fluids were given.  Diagnostic work-up was initiated.  CBC showed a significant leukocytosis of 21.2.  Imaging studies showed no acute injuries but did show a left-sided pleural effusion.  Etiology of this new pleural effusion is indeterminate at this time.  Malignant effusion and/or traumatic hemothorax is possible.  Given his white count and recent symptoms of generalized weakness and confusion, patient to be covered empirically for infection with a possible parapneumonic effusion.  Lactate, cultures and additional IV fluids ordered.  On reassessment, patient denied  any worsening symptoms.  He was noted to have a purulent discharge from his eyes.  This is new since initial assessment.  He does endorse mild discomfort to his right eye.  Erythromycin ointment was ordered for treatment of bacterial conjunctivitis.  He remained hemodynamically stable.  He remained on room air.  Breathing was not significantly labored.  Patient was admitted to hospitalist for further management.  CRITICAL CARE Performed by: Godfrey Pick   Total critical care time: 35 minutes  Critical care time was exclusive of separately billable procedures and treating other patients.  Critical care was necessary to treat or prevent imminent or life-threatening deterioration.  Critical care was time spent personally by me on the following activities: development of treatment plan with patient and/or surrogate as well as nursing, discussions with consultants, evaluation of patient's response to treatment, examination of patient, obtaining history from patient or surrogate, ordering and performing treatments and interventions, ordering and review of laboratory studies, ordering and review of radiographic studies, pulse oximetry and re-evaluation of patient's condition.  Final Clinical Impression(s) / ED Diagnoses Final diagnoses:  Fall  Lactic acidosis  Altered mental status, unspecified altered mental status type  Generalized weakness  Pleural effusion, left    Rx / DC Orders ED Discharge Orders     None        Godfrey Pick, MD 06/20/21 [phone]

## 2021-06-19 NOTE — Progress Notes (Signed)
A consult was received from an ED physician for cefepime and vancomycin per pharmacy dosing.  The patient's profile has been reviewed for ht/wt/allergies/indication/available labs.   I agree with order entered by provider for cefepime 2 g IV x 1.  A one time order has been placed for vancomycin 1750mg  IV.  Further antibiotics/pharmacy consults should be ordered by admitting physician if indicated.                       Thank you, Suzzanne Cloud, PharmD, BCPS 06/19/2021  9:27 PM

## 2021-06-19 NOTE — ED Triage Notes (Signed)
Per EMS-mechanical fall due to syncopal episode which is normal for him-complaining of B/L flank pain and left shoulder pain

## 2021-06-20 ENCOUNTER — Inpatient Hospital Stay (HOSPITAL_COMMUNITY): Payer: Medicare Other

## 2021-06-20 ENCOUNTER — Other Ambulatory Visit (HOSPITAL_COMMUNITY): Payer: Self-pay | Admitting: Physician Assistant

## 2021-06-20 ENCOUNTER — Other Ambulatory Visit: Payer: Self-pay

## 2021-06-20 ENCOUNTER — Encounter (HOSPITAL_COMMUNITY): Payer: Self-pay | Admitting: Family Medicine

## 2021-06-20 DIAGNOSIS — A480 Gas gangrene: Secondary | ICD-10-CM

## 2021-06-20 DIAGNOSIS — H109 Unspecified conjunctivitis: Secondary | ICD-10-CM

## 2021-06-20 DIAGNOSIS — J90 Pleural effusion, not elsewhere classified: Secondary | ICD-10-CM | POA: Diagnosis not present

## 2021-06-20 DIAGNOSIS — R55 Syncope and collapse: Secondary | ICD-10-CM

## 2021-06-20 DIAGNOSIS — E872 Acidosis, unspecified: Secondary | ICD-10-CM

## 2021-06-20 LAB — CBC
HCT: 29.9 % — ABNORMAL LOW (ref 39.0–52.0)
Hemoglobin: 9.9 g/dL — ABNORMAL LOW (ref 13.0–17.0)
MCH: 33.6 pg (ref 26.0–34.0)
MCHC: 33.1 g/dL (ref 30.0–36.0)
MCV: 101.4 fL — ABNORMAL HIGH (ref 80.0–100.0)
Platelets: 258 10*3/uL (ref 150–400)
RBC: 2.95 MIL/uL — ABNORMAL LOW (ref 4.22–5.81)
RDW: 16.1 % — ABNORMAL HIGH (ref 11.5–15.5)
WBC: 21.8 10*3/uL — ABNORMAL HIGH (ref 4.0–10.5)
nRBC: 0 % (ref 0.0–0.2)

## 2021-06-20 LAB — BASIC METABOLIC PANEL
Anion gap: 10 (ref 5–15)
BUN: 13 mg/dL (ref 8–23)
CO2: 25 mmol/L (ref 22–32)
Calcium: 8.5 mg/dL — ABNORMAL LOW (ref 8.9–10.3)
Chloride: 99 mmol/L (ref 98–111)
Creatinine, Ser: 0.89 mg/dL (ref 0.61–1.24)
GFR, Estimated: >60 mL/min (ref >60)
Glucose, Bld: 134 mg/dL — ABNORMAL HIGH (ref 70–99)
Potassium: 3.7 mmol/L (ref 3.5–5.1)
Sodium: 134 mmol/L — ABNORMAL LOW (ref 135–145)

## 2021-06-20 LAB — PROTEIN, PLEURAL OR PERITONEAL FLUID: Total protein, fluid: 3.2 g/dL

## 2021-06-20 LAB — T4, FREE: Free T4: 0.6 ng/dL — ABNORMAL LOW (ref 0.61–1.12)

## 2021-06-20 LAB — AMYLASE: Amylase: 84 U/L (ref 28–100)

## 2021-06-20 LAB — GLUCOSE, PLEURAL OR PERITONEAL FLUID: Glucose, Fluid: 113 mg/dL

## 2021-06-20 LAB — TSH: TSH: 39.992 u[IU]/mL — ABNORMAL HIGH (ref 0.350–4.500)

## 2021-06-20 LAB — BODY FLUID CELL COUNT WITH DIFFERENTIAL
Eos, Fluid: 0 %
Lymphs, Fluid: 35 %
Monocyte-Macrophage-Serous Fluid: 46 % — ABNORMAL LOW (ref 50–90)
Neutrophil Count, Fluid: 19 % (ref 0–25)
Total Nucleated Cell Count, Fluid: 362 cu mm (ref 0–1000)

## 2021-06-20 LAB — LACTATE DEHYDROGENASE, PLEURAL OR PERITONEAL FLUID: LD, Fluid: 279 U/L — ABNORMAL HIGH (ref 3–23)

## 2021-06-20 LAB — LACTIC ACID, PLASMA
Lactic Acid, Venous: 4.3 mmol/L (ref 0.5–1.9)
Lactic Acid, Venous: 4.6 mmol/L (ref 0.5–1.9)
Lactic Acid, Venous: 4.1 mmol/L (ref 0.5–1.9)
Lactic Acid, Venous: 5.3 mmol/L (ref 0.5–1.9)

## 2021-06-20 LAB — LIPASE, BLOOD: Lipase: 18 U/L (ref 11–51)

## 2021-06-20 LAB — TROPONIN I (HIGH SENSITIVITY): Troponin I (High Sensitivity): 11 ng/L (ref <18)

## 2021-06-20 LAB — URIC ACID: Uric Acid, Serum: 7 mg/dL (ref 3.7–8.6)

## 2021-06-20 MED ORDER — ENOXAPARIN SODIUM 40 MG/0.4ML IJ SOSY
40.0000 mg | PREFILLED_SYRINGE | INTRAMUSCULAR | Status: DC
  Administered 2021-06-21 – 2021-06-29 (×8): 40 mg via SUBCUTANEOUS
  Filled 2021-06-20 (×8): qty 0.4

## 2021-06-20 MED ORDER — BRIMONIDINE TARTRATE 0.2 % OP SOLN
1.0000 [drp] | Freq: Two times a day (BID) | OPHTHALMIC | Status: DC
  Administered 2021-06-20 – 2021-06-29 (×18): 1 [drp] via OPHTHALMIC
  Filled 2021-06-20: qty 5

## 2021-06-20 MED ORDER — CHLORHEXIDINE GLUCONATE CLOTH 2 % EX PADS
6.0000 | MEDICATED_PAD | Freq: Every day | CUTANEOUS | Status: DC
  Administered 2021-06-20 – 2021-06-29 (×10): 6 via TOPICAL

## 2021-06-20 MED ORDER — SODIUM CHLORIDE 0.9 % IV SOLN: INTRAVENOUS | Status: AC

## 2021-06-20 MED ORDER — ADULT MULTIVITAMIN W/MINERALS CH
1.0000 | ORAL_TABLET | Freq: Every day | ORAL | Status: DC
  Administered 2021-06-20 – 2021-06-26 (×7): 1 via ORAL
  Filled 2021-06-20 (×7): qty 1

## 2021-06-20 MED ORDER — MIDODRINE HCL 5 MG PO TABS
5.0000 mg | ORAL_TABLET | Freq: Three times a day (TID) | ORAL | Status: DC
  Administered 2021-06-20 – 2021-06-25 (×13): 5 mg via ORAL
  Filled 2021-06-20 (×17): qty 1

## 2021-06-20 MED ORDER — CYCLOSPORINE 0.05 % OP EMUL
1.0000 [drp] | Freq: Two times a day (BID) | OPHTHALMIC | Status: DC
  Administered 2021-06-20 – 2021-06-29 (×18): 1 [drp] via OPHTHALMIC
  Filled 2021-06-20 (×18): qty 30

## 2021-06-20 MED ORDER — SODIUM CHLORIDE 0.9 % IV BOLUS
1000.0000 mL | Freq: Once | INTRAVENOUS | Status: AC
  Administered 2021-06-20: 17:00:00 1000 mL via INTRAVENOUS

## 2021-06-20 MED ORDER — LATANOPROST 0.005 % OP SOLN
1.0000 [drp] | Freq: Every day | OPHTHALMIC | Status: DC
  Administered 2021-06-20 – 2021-06-28 (×9): 1 [drp] via OPHTHALMIC
  Filled 2021-06-20: qty 2.5

## 2021-06-20 MED ORDER — TIMOLOL MALEATE 0.5 % OP SOLN
1.0000 [drp] | Freq: Two times a day (BID) | OPHTHALMIC | Status: DC
  Administered 2021-06-20 – 2021-06-29 (×18): 1 [drp] via OPHTHALMIC
  Filled 2021-06-20: qty 5

## 2021-06-20 MED ORDER — ERYTHROMYCIN 5 MG/GM OP OINT
TOPICAL_OINTMENT | Freq: Four times a day (QID) | OPHTHALMIC | Status: DC
  Filled 2021-06-20: qty 1

## 2021-06-20 MED ORDER — BRIMONIDINE TARTRATE-TIMOLOL 0.2-0.5 % OP SOLN: 1.0000 [drp] | Freq: Two times a day (BID) | OPHTHALMIC | Status: DC

## 2021-06-20 MED ORDER — MIDODRINE HCL 5 MG PO TABS
5.0000 mg | ORAL_TABLET | Freq: Two times a day (BID) | ORAL | Status: DC
  Administered 2021-06-20: 09:00:00 5 mg via ORAL
  Filled 2021-06-20: qty 1

## 2021-06-20 MED ORDER — LEVOTHYROXINE SODIUM 50 MCG PO TABS
100.0000 ug | ORAL_TABLET | Freq: Every morning | ORAL | Status: DC
  Administered 2021-06-21 – 2021-06-25 (×5): 100 ug via ORAL
  Filled 2021-06-20 (×5): qty 2

## 2021-06-20 MED ORDER — SODIUM CHLORIDE 0.9 % IV SOLN
3.0000 g | Freq: Four times a day (QID) | INTRAVENOUS | Status: AC
  Administered 2021-06-20 – 2021-06-24 (×20): 3 g via INTRAVENOUS
  Filled 2021-06-20 (×20): qty 8

## 2021-06-20 MED ORDER — SODIUM CHLORIDE 0.9 % IV BOLUS
1000.0000 mL | Freq: Once | INTRAVENOUS | Status: AC
  Administered 2021-06-20: 13:00:00 1000 mL via INTRAVENOUS

## 2021-06-20 MED ORDER — SODIUM CHLORIDE 0.9 % IV BOLUS
500.0000 mL | Freq: Once | INTRAVENOUS | Status: AC
  Administered 2021-06-20: 11:00:00 500 mL via INTRAVENOUS

## 2021-06-20 MED ORDER — ERYTHROMYCIN 5 MG/GM OP OINT
TOPICAL_OINTMENT | Freq: Four times a day (QID) | OPHTHALMIC | Status: AC
  Administered 2021-06-20 – 2021-06-24 (×2): 1 via OPHTHALMIC
  Filled 2021-06-20: qty 3.5

## 2021-06-20 MED ORDER — LIDOCAINE HCL 1 % IJ SOLN
INTRAMUSCULAR | Status: AC
  Administered 2021-06-20: 14:00:00 10 mL
  Filled 2021-06-20: qty 20

## 2021-06-20 NOTE — Progress Notes (Addendum)
IP PROGRESS NOTE ° °Subjective:  ° °Erik Fletcher is known to me with a history of metastatic cholangiocarcinoma.  He is currently being treated with FOLFOX chemotherapy.  He was last treated with chemotherapy 05/24/2021.  He developed severe neutropenia following this cycle of chemotherapy and the next cycle of chemotherapy was placed on hold. °Erik Fletcher presented to the emergency room yesterday after a fall.  He had pain in the back.  He was admitted for further evaluation. °Erik Fletcher has no specific complaint this morning. ° °Objective: °Vital signs in last 24 hours: °Blood pressure 106/70, pulse 80, temperature 98.5 °F (36.9 °C), temperature source Oral, resp. rate 12, SpO2 93 %. ° °Intake/Output from previous day: °12/27 0701 - 12/28 0700 °In: 500 [IV Piggyback:500] °Out: -  ° °Physical Exam: ° °HEENT: No thrush or ulcers °Lungs: Clear anteriorly, no respiratory distress °Cardiac: Regular rate and rhythm °Abdomen: The liver edge is palpable in the right upper abdomen, no splenomegaly °Extremities: No leg edema °Neurologic: Lethargic, arousable, follows commands ° °Portacath/PICC-without erythema ° °Lab Results: °Recent Labs  °  06/19/21 °2005 06/20/21 °0551  °WBC 21.2* 21.8*  °HGB 10.0* 9.9*  °HCT 30.0* 29.9*  °PLT 279 258  ° ° °BMET °Recent Labs  °  06/19/21 °2005 06/20/21 °0551  °NA 134* 134*  °K 3.9 3.7  °CL 98 99  °CO2 24 25  °GLUCOSE 93 134*  °BUN 14 13  °CREATININE 0.85 0.89  °CALCIUM 8.5* 8.5*  ° ° °Lab Results  °Component Value Date  ° CEA 54.5 (H) 12/01/2020  ° CAN199 97,813 (H) 06/06/2021  ° ° °Studies/Results: °DG Pelvis 1-2 Views ° °Result Date: 06/19/2021 °CLINICAL DATA:  Mechanical fall after syncopal episode. EXAM: PELVIS - 1-2 VIEW COMPARISON:  None. FINDINGS: Residual contrast material in the bladder. Postoperative changes in the lower lumbar spine. Degenerative changes in both hips with narrowed acetabular joint and sclerosis and osteophyte formation. No evidence of acute fracture or  dislocation. No focal bone lesion or bone destruction. Bone cortex appears intact. IMPRESSION: Degenerative changes in both hips.  No acute bony abnormalities. Electronically Signed   By: William  Stevens M.D.   On: 06/19/2021 20:56  ° °CT HEAD WO CONTRAST ° °Result Date: 06/19/2021 °CLINICAL DATA:  Head trauma EXAM: CT HEAD WITHOUT CONTRAST TECHNIQUE: Contiguous axial images were obtained from the base of the skull through the vertex without intravenous contrast. COMPARISON:  None. FINDINGS: Brain: There is no mass, hemorrhage or extra-axial collection. There is generalized atrophy without lobar predilection. Hypodensity of the white matter is most commonly associated with chronic microvascular disease. There are calcifications within the corona radiata and basal ganglia Vascular: No abnormal hyperdensity of the major intracranial arteries or dural venous sinuses. No intracranial atherosclerosis. Skull: The visualized skull base, calvarium and extracranial soft tissues are normal. Sinuses/Orbits: No fluid levels or advanced mucosal thickening of the visualized paranasal sinuses. No mastoid or middle ear effusion. The orbits are normal. IMPRESSION: 1. No acute intracranial abnormality. 2. Atrophy and chronic microvascular ischemia. Cerebral Atrophy (ICD10-G31.9). Electronically Signed   By: Kevin  Herman M.D.   On: 06/19/2021 20:02  ° °CT CHEST W CONTRAST ° °Result Date: 06/19/2021 °CLINICAL DATA:  Chest trauma, blunt. Syncopal episode with fall. Bilateral flank pain and LEFT shoulder pain. Back pain. EXAM: CT CHEST WITH CONTRAST TECHNIQUE: Multidetector CT imaging of the chest was performed during intravenous contrast administration. CONTRAST:  60mL OMNIPAQUE IOHEXOL 350 MG/ML SOLN COMPARISON:  Chest CT dated 02/27/2021. FINDINGS: Cardiovascular: Thoracic aorta appears intact   and stable in configuration. Scattered atherosclerosis of the thoracic aorta and coronary arteries. No significant pericardial effusion.  Mediastinum/Nodes: No mass or enlarged lymph nodes are seen within the mediastinum. Esophagus is somewhat patulous, containing recently ingested material, but otherwise unremarkable. Trachea and central bronchi are unremarkable. Lungs/Pleura: Moderate-sized LEFT pleural effusion with overlying compressive atelectasis. Mild atelectasis versus pleural thickening at the RIGHT lung base. No pneumothorax. Upper Abdomen: Hypodense lesions/masses throughout the liver, compatible with previously described hepatic metastases. Scattered areas of free fluid/ascites within the upper abdomen. Musculoskeletal: Degenerative spondylosis of the kyphotic thoracic spine, mild to moderate in degree. Anterior cervical fusion hardware within the lower cervical spine. No acute-appearing osseous abnormality. IMPRESSION: 1. Moderate-sized LEFT pleural effusion with overlying compressive atelectasis, new compared to the earlier chest CT of 02/27/2021. 2. Known hepatic metastases, not significantly changed compared to CT of 02/27/2021. Also similar appearance of the periportal/gastrohepatic ligament adenopathy that was also described on the earlier CT. 3. Scattered free fluid/ascites within the upper abdomen. 4. No acute osseous fracture or dislocation identified. 5. Debris versus recently ingested material within the upper esophagus. This presents a risk for potential aspiration. Aortic Atherosclerosis (ICD10-I70.0). Electronically Signed   By: Stan  Maynard M.D.   On: 06/19/2021 20:33  ° °CT CERVICAL SPINE WO CONTRAST ° °Result Date: 06/19/2021 °CLINICAL DATA:  Neck trauma, EXAM: CT CERVICAL SPINE WITHOUT CONTRAST TECHNIQUE: Multidetector CT imaging of the cervical spine was performed without intravenous contrast. Multiplanar CT image reconstructions were also generated. COMPARISON:  None. FINDINGS: Alignment: Moderate dextroscoliosis of the cervical spine. No evidence of acute vertebral body subluxation. Skull base and vertebrae: No  fracture line or displaced fracture fragment is seen. Anterior cervical fusion hardware at C5 through C7 appears intact and appropriately positioned. Soft tissues and spinal canal: No prevertebral fluid or swelling. No visible canal hematoma. Disc levels: Mild degenerative spurring at multiple levels, but no more than mild central canal stenosis at any level. Upper chest: Debris versus recently ingested material within the upper esophagus. Limited images of the upper chest are otherwise unremarkable. Other: Bilateral carotid atherosclerosis. IMPRESSION: 1. No acute findings within the cervical spine. No osseous fracture or dislocation. No evidence of osseous metastasis. 2. Anterior cervical fusion hardware at C5 through C7 appears intact and appropriately positioned. 3. Moderate scoliosis. 4. Bilateral carotid atherosclerosis. 5. Debris versus recently ingested material within the upper esophagus. This presents a potential risk for aspiration. Electronically Signed   By: Stan  Maynard M.D.   On: 06/19/2021 20:43  ° °CT T-SPINE NO CHARGE ° °Result Date: 06/19/2021 °CLINICAL DATA:  Chest trauma, back pain. EXAM: CT THORACIC SPINE WITHOUT CONTRAST TECHNIQUE: Multidetector CT images of the thoracic were obtained using the standard protocol without intravenous contrast. COMPARISON:  None. FINDINGS: Alignment: Moderate to severe kyphosis of the thoracic spine. No evidence of acute vertebral body subluxation. Vertebrae: No fracture line or displaced fracture fragment. No evidence of acute compression fracture deformity.w Paraspinal and other soft tissues: Moderate-sized LEFT pleural effusion with overlying compressive atelectasis, new compared to a CT chest CT of 02/27/2021. Multiple nonobstructing LEFT renal stones, largest measuring 8 mm. Known hepatic metastases, incompletely imaged. Visualized paraspinal soft tissues are otherwise unremarkable. Disc levels: Degenerative spondylosis throughout the thoracolumbar spine,  mild to moderate in degree, with associated mild-to-moderate central canal stenoses at multiple levels. IMPRESSION: 1. No acute findings within the thoracic spine. No acute osseous fracture or dislocation within the thoracic spine. No evidence of osseous metastasis within the thoracic spine. 2. Kyphosis and degenerative   changes of the thoracic spine, as detailed above. 3. Moderate-sized LEFT pleural effusion with overlying compressive atelectasis, new compared to a recent chest CT of 02/27/2021. 4. Known hepatic metastases, incompletely imaged. 5. Nonobstructing LEFT renal stones. Electronically Signed   By: Stan  Maynard M.D.   On: 06/19/2021 20:39  ° °DG Lumbar Spine 2-3Vclearing ° °Result Date: 06/19/2021 °CLINICAL DATA:  Low back pain after fall. EXAM: LIMITED LUMBAR SPINE FOR TRAUMA CLEARING - 2-3 VIEW COMPARISON:  September 13, 2015. FINDINGS: Status post surgical posterior fusion of L3-4 and L4-5 with bilateral intrapedicular screw placement and interbody fusion. No fracture or spondylolisthesis is noted. IMPRESSION: Postsurgical changes as described above.  No acute abnormality seen. Electronically Signed   By: James  Green Jr M.D.   On: 06/19/2021 20:55   ° °Medications: I have reviewed the patient's current medications. 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 °Cycle 1 FOLFOX 03/06/2021 °Cycle 2 FOLFOX 03/21/2021 °Cycle 3 FOLFOX 05/24/2021 °Glaucoma °Anorexia/weight loss secondary to #1 °Pain secondary to #1 °Port-A-Cath placement 12/27/2020 °TSH markedly elevated 05/04/2021-Euthyrox increased from 50 mcg to 100 mcg °Severe neutropenia following cycle 3 FOLFOX, chemotherapy held 06/06/2021, Udenyca to be added with cycle 4 °Admission 06/19/2021 following a fall °New left effusion on chest CT 06/19/2021 ° °  °Erik Fletcher has metastatic cholangiocarcinoma.  He is currently being treated with FOLFOX chemotherapy after clinical progression with gemcitabine/cisplatin/Durvalumab.  He has a poor performance status.  He was scheduled to undergo a restaging CT evaluation after the next cycle of chemotherapy. °He is now admitted following a fall.  No acute findings are seen on CT and plain x-ray imaging. ° °He has a history of hypotension with intermittent falls.  The falls are related to deconditioning and hypotension.  He has severe malnutrition.  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  He was placed on a trial of midodrine. ° °He has hypothyroidism.  The thyroid hormone replacement dose was recently increased.  The TSH is persistently elevated. ° °I have a low clinical suspicion for an empyema and sepsis.  I suspect the left pleural effusion is malignant. ° °Recommendations: °1.  Diagnostic left thoracentesis °2.  Supportive care, out of bed as tolerated °3.  I will  recommend home hospice care if the left pleural effusion is malignant or if his performance status does not improve. °4.  Outpatient follow-up will be scheduled at the Cancer center. ° °I will contact his wife by telephone. ° LOS: 1 day  ° °Erik Sherrill, MD  ° °06/20/2021, 2:06 PM ° °I spoke to Ms. Cutsforth at approximately 3 PM.  She reports he stays in bed most of the time and has limited oral intake.  She will agree to home hospice care if   the thoracentesis confirms a malignant effusion.  She also understands hospice care is likely the best option if his performance status remains poor. °  °

## 2021-06-20 NOTE — Progress Notes (Addendum)
PROGRESS NOTE    Erik Fletcher.  LPF:[id number] DOB: [birth year] DOA: 06/19/2021 PCP: Lawerance Cruel, MD   Chief Complaint  Patient presents with   Fall  Brief Narrative/Hospital Course: Erik Fletcher., 70 y.o. 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  On 12/26 he was walking to the bathroom and suddenly had loss of consciousness and fell backwards and was having back pain.  Has been having syncopal episodes with falls several times a month seen by oncology felt to be orthostatic hypotension and was started on midodrine in November and also had his cortisol checked on 11/11 that was up at 28.  Patient denies any chest pain palpitation shortness of breath nausea vomiting.  In the ED hemodynamically stable. WBC of 21K, hemoglobin of 10,lactate of 4. Sodium of 134, K of 3.9, creatinine was 0.85, BG of 93 CT head and cervical spine was negative for acute findings. CT chest shows moderate-sized left pleural effusion with compressive atelectasis.  There is also debris versus ingested material within the upper esophagus concerning for aspiration. Lumbar spine and pelvis was negative.   Subjective: Seen and examined this morning.  Alert awake resting comfortably has no complaints Afebrile overnight, labs with persistently elevated WBC count similar range of 21K, stable electrolytes Blood pressure in 105-150s  Assessment & Plan:  Recurrent syncope with loss of consciousness and falls: Cortisol level was up, at 28 In November-felt to be orthostatic and was placed on midodrine in November by his oncology.  Increase to 5 mg 3 times daily, follow-up echocardiogram if there is any cardiomyopathy in the setting of chemo use.  Patient reports he was not taking midodrine until yesterday.  Left-sided pleural effusion with concern for parapneumonic effusion seen on the CT  chest:In the setting of immunocompromise status placed on IV Unasyn,  PENDINGf Legionella urine antigen,. US thoracentesis and pleural fluid analysis labs were ordered changed timing for today.  He is doing well on room air.  Mild left hand cellulitis patient is now on Unasyn .he had dog scratch.  Received tetanus booster in the ED  Lactic acidosis: Unclear etiology could be multifactorial pleural effusion, cellulitis metastatic cholangiocarcinoma.  He is afebrile, resting bp in 100s-orthostatic BP was 82/63 - we will do iv bolus 1 liter again, got 500 ml this am. Cont on 150 ml/hr nss  and  repeat lactic acid. Recent Labs  Lab 06/19/21 2210 06/20/21 0043 06/20/21 0852  LATICACIDVEN 4.0* 4.3* 4.6*    Metastatic cholangiocarcinoma on chemotherapy by Dr. Learta Codding.  CT chest showed hypodense lesions/masses throughout the liver consistent with hepatic metastasis scattered areas of free fluid/ascites in the upper abdomen.  We will consult oncology  Right bacterial conjunctivitis on erythromycin ointment 3 times daily  Leukocytosis:Count was WBC 1K on 12/14 on admission 21.2K and overall the same today.  Suspect in the setting of patient's chemo use as well as metastatic disease.  Consult oncology  Addendum: Patient repeat lactic acid was not 5.2, discussed with PCCM Dr Chase Caller since hemodynamically stable advised to further work-up w/ ABG procalcitonin , amylase, lipase troponins also give additional ivf- will order 1 L bolus, recheck lactic acid in a.m. suspect low clearance of lactic acid due to liver mets.  DVT prophylaxis: SCDs Start: 06/20/21 0008 Code Status:   Code Status: Full Code Family Communication: plan of care discussed with patient at bedside. Status is: Inpatient Remains inpatient appropriate  because: Ongoing management of pleural effusion orthostatic hypotension leukocytosis lactic acidosis Disposition: Currently not medically stable for discharge. Anticipated Disposition:  TBD  Objective: Vitals last 24 hrs: Vitals:   06/19/21 2115 06/19/21 2124 06/20/21 0003 06/20/21 0421  BP: (!) 153/99 (!) 153/99 (!) 149/101 105/77  Pulse: 87 79 84 79  Resp:  19 16 18   Temp:   98.2 F (36.8 C) 97.6 F (36.4 C)  TempSrc:   Oral Oral  SpO2: 99% 92% 98% 95%   Weight change:   Intake/Output Summary (Last 24 hours) at 06/20/2021 1152 Last data filed at 06/20/2021 6294 Gross per 24 hour  Intake 1006.13 ml  Output 250 ml  Net 756.13 ml   Net IO Since Admission: 756.13 mL [06/20/21 1152]   Physical Examination: General exam: AA0X3, weak,older than stated age. HEENT:Oral mucosa moist, Ear/Nose WNL grossly,dentition normal. Respiratory system: B/l diminished BS at the bases, no use of accessory muscle, non tender. Cardiovascular system: S1 & S2 +,No JVD. Gastrointestinal system: Abdomen soft, NT,ND, BS+. Nervous System:Alert, awake, moving extremities. Extremities: edema none, distal peripheral pulses palpable.  Skin: No rashes, no icterus. MSK: Normal muscle bulk, tone, power.  Medications reviewed:  Scheduled Meds:  Chlorhexidine Gluconate Cloth  6 each Topical Daily   erythromycin   Both Eyes Once   erythromycin   Right Eye Q6H   midodrine  5 mg Oral BID WC   oxyCODONE  10 mg Oral Once   Continuous Infusions:  sodium chloride 150 mL/hr at 06/20/21 1059   ampicillin-sulbactam (UNASYN) IV 3 g (06/20/21 0857)   ceFEPime (MAXIPIME) IV     lactated ringers      Diet Order             Diet regular Room service appropriate? Yes; Fluid consistency: Thin  Diet effective now                  Weight change:   Wt Readings from Last 3 Encounters:  06/06/21 78.9 kg  05/24/21 79.7 kg  05/11/21 77.6 kg   Consultants:see note  Procedures:see note Antimicrobials: Anti-infectives (From admission, onward)    Start     Dose/Rate Route Frequency Ordered Stop   06/20/21 0200  Ampicillin-Sulbactam (UNASYN) 3 g in sodium chloride 0.9 % 100 mL IVPB        3  g 200 mL/hr over 30 Minutes Intravenous Every 6 hours 06/20/21 0022     06/19/21 2200  vancomycin (VANCOREADY) IVPB 1750 mg/350 mL        1,750 mg 175 mL/hr over 120 Minutes Intravenous  Once 06/19/21 2127 06/20/21 0856   06/19/21 2130  vancomycin (VANCOCIN) IVPB 1000 mg/200 mL premix  Status:  Discontinued        1,000 mg 200 mL/hr over 60 Minutes Intravenous  Once 06/19/21 2119 06/19/21 2125   06/19/21 2130  ceFEPIme (MAXIPIME) 2 g in sodium chloride 0.9 % 100 mL IVPB        2 g 200 mL/hr over 30 Minutes Intravenous  Once 06/19/21 2119        Culture/Microbiology    Component Value Date/Time   SDES  06/19/2021 2216    BLOOD RIGHT CHEST Performed at [facility], [place] [address] [place], [place] [address]    SDES  06/19/2021 2216    BLOOD BLOOD RIGHT WRIST Performed at [facility] [address] [place],  [address]    [facility]  06/19/2021 2216    Blood Culture adequate  volume BOTTLES DRAWN AEROBIC AND ANAEROBIC Performed at [facility] [address] [place], [place] [address]    SPECREQUEST  06/19/2021 2216    Blood Culture results may not be optimal due to an inadequate volume of blood received in culture bottles BOTTLES DRAWN AEROBIC AND ANAEROBIC Performed at [facility], [place] [address], [place], [place] [address]    CULT  06/19/2021 2216    NO GROWTH < 12 HOURS Performed at [facility] [address] [place], [place] [address]    CULT  06/19/2021 2216    NO GROWTH < 12 HOURS Performed at [facility] [address], [place], [place] [address]    REPTSTATUS PENDING 06/19/2021 2216   REPTSTATUS PENDING 06/19/2021 2216    Other culture-see note  Unresulted Labs (From admission, onward)     Start     Ordered   06/20/21 1033  T4, free  Add-on,   AD        06/20/21 1033   06/20/21 1033  T3, free  Add-on,   AD        06/20/21 1033   06/20/21 0832  Lactic  acid, plasma  STAT Now then every 3 hours,   R      06/20/21 0831   06/20/21 0108  Legionella Pneumophila Serogp 1 Ur Ag  Once,   R        06/20/21 0107   06/20/21 0107  Strep pneumoniae urinary antigen  Once,   R        06/20/21 0107   06/19/21 2109  Urine Culture  (Septic presentation on arrival (screening labs, nursing and treatment orders for obvious sepsis))  ONCE - STAT,   STAT       Question:  Indication  Answer:  Altered mental status (if no other cause identified)   06/19/21 2119   06/19/21 1911  Urinalysis, Routine w reflex microscopic  (Trauma Panel)  Once,   STAT        06/19/21 1913           Data Reviewed: I have personally reviewed following labs and imaging studies CBC: Recent Labs  Lab 06/19/21 2005 06/20/21 0551  WBC 21.2* 21.8*  NEUTROABS 17.8*  --   HGB 10.0* 9.9*  HCT 30.0* 29.9*  MCV 100.0 101.4*  PLT 279 517   Basic Metabolic Panel: Recent Labs  Lab 06/19/21 2005 06/20/21 0551  NA 134* 134*  K 3.9 3.7  CL 98 99  CO2 24 25  GLUCOSE 93 134*  BUN 14 13  CREATININE 0.85 0.89  CALCIUM 8.5* 8.5*  MG 1.8  --    GFR: CrCl cannot be calculated (Unknown ideal weight.). Liver Function Tests: Recent Labs  Lab 06/19/21 2005  AST 46*  ALT 21  ALKPHOS 440*  BILITOT 1.0  PROT 6.1*  ALBUMIN 2.5*   No results for input(s): LIPASE, AMYLASE in the last 168 hours. No results for input(s): AMMONIA in the last 168 hours. Coagulation Profile: Recent Labs  Lab 06/19/21 2005  INR 1.1   Cardiac Enzymes: Recent Labs  Lab 06/19/21 2005  CKTOTAL 59   BNP (last 3 results) No results for input(s): PROBNP in the last 8760 hours. HbA1C: No results for input(s): HGBA1C in the last 72 hours. CBG: No results for input(s): GLUCAP in the last 168 hours. Lipid Profile: No results for input(s): CHOL, HDL, LDLCALC, TRIG, CHOLHDL, LDLDIRECT in the last 72 hours. Thyroid Function Tests:  Recent Labs    06/20/21 0551  TSH 39.992*   Anemia Panel: No  results for input(s): VITAMINB12, FOLATE, FERRITIN, TIBC, IRON, RETICCTPCT in the last 72 hours. Sepsis Labs: Recent Labs  Lab 06/19/21 2210 06/20/21 0043 06/20/21 0852  LATICACIDVEN 4.0* 4.3* 4.6*    Recent Results (from the past 240 hour(s))  Resp Panel by RT-PCR (Flu A&B, Covid) Nasopharyngeal Swab     Status: None   Collection Time: 06/19/21  8:05 PM   Specimen: Nasopharyngeal Swab; Nasopharyngeal(NP) swabs in vial transport medium  Result Value Ref Range Status   SARS Coronavirus 2 by RT PCR NEGATIVE NEGATIVE Final    Comment: (NOTE) SARS-CoV-2 target nucleic acids are NOT DETECTED.  The SARS-CoV-2 RNA is generally detectable in upper respiratory specimens during the acute phase of infection. The lowest concentration of SARS-CoV-2 viral copies this assay can detect is 138 copies/mL. A negative result does not preclude SARS-Cov-2 infection and should not be used as the sole basis for treatment or other patient management decisions. A negative result may occur with  improper specimen collection/handling, submission of specimen other than nasopharyngeal swab, presence of viral mutation(s) within the areas targeted by this assay, and inadequate number of viral copies(<138 copies/mL). A negative result must be combined with clinical observations, patient history, and epidemiological information. The expected result is Negative.  Fact Sheet for Patients:  EntrepreneurPulse.com.au  Fact Sheet for Healthcare Providers:  IncredibleEmployment.be  This test is no t yet approved or cleared by the [place] FDA and  has been authorized for detection and/or diagnosis of SARS-CoV-2 by FDA under an Emergency Use Authorization (EUA). This EUA will remain  in effect (meaning this test can be used) for the duration of the COVID-19 declaration under Section 564(b)(1) of the Act, 21 U.S.C.section 360bbb-3(b)(1), unless the authorization is terminated   or revoked sooner.       Influenza A by PCR NEGATIVE NEGATIVE Final   Influenza B by PCR NEGATIVE NEGATIVE Final    Comment: (NOTE) The Xpert Xpress SARS-CoV-2/FLU/RSV plus assay is intended as an aid in the diagnosis of influenza from Nasopharyngeal swab specimens and should not be used as a sole basis for treatment. Nasal washings and aspirates are unacceptable for Xpert Xpress SARS-CoV-2/FLU/RSV testing.  Fact Sheet for Patients: EntrepreneurPulse.com.au  Fact Sheet for Healthcare Providers: IncredibleEmployment.be  This test is not yet approved or cleared by the [place] FDA and has been authorized for detection and/or diagnosis of SARS-CoV-2 by FDA under an Emergency Use Authorization (EUA). This EUA will remain in effect (meaning this test can be used) for the duration of the COVID-19 declaration under Section 564(b)(1) of the Act, 21 U.S.C. section 360bbb-3(b)(1), unless the authorization is terminated or revoked.  Performed at [facility], [place] [address], [place], [place] [address]   Blood Culture (routine x 2)     Status: None (Preliminary result)   Collection Time: 06/19/21 10:16 PM   Specimen: BLOOD  Result Value Ref Range Status   Specimen Description   Final    BLOOD RIGHT CHEST Performed at [facility] [address], [place], [place] [address]    Special Requests   Final    Blood Culture adequate volume BOTTLES DRAWN AEROBIC AND ANAEROBIC Performed at [facility] [address], [place], [place] [address]    Culture   Final    NO GROWTH < 12 HOURS Performed at [facility] [address], [place], [place]  [address]    Report Status PENDING  Incomplete  Blood Culture (routine x 2)     Status: None (Preliminary result)   Collection Time: 06/19/21 10:16 PM   Specimen: BLOOD  Result Value Ref Range Status   Specimen Description   Final    BLOOD  BLOOD RIGHT WRIST Performed at [facility] [address], [place], [place] [address]    Special Requests   Final    Blood Culture results may not be optimal due to an inadequate volume of blood received in culture bottles BOTTLES DRAWN AEROBIC AND ANAEROBIC Performed at [facility], [place] [address], [place], [place] [address]    Culture   Final    NO GROWTH < 12 HOURS Performed at [facility] [address], [place],  [address]    Report Status PENDING  Incomplete     Radiology Studies: DG Pelvis 1-2 Views  Result Date: 06/19/2021 CLINICAL DATA:  Mechanical fall after syncopal episode. EXAM: PELVIS - 1-2 VIEW COMPARISON:  None. FINDINGS: Residual contrast material in the bladder. Postoperative changes in the lower lumbar spine. Degenerative changes in both hips with narrowed acetabular joint and sclerosis and osteophyte formation. No evidence of acute fracture or dislocation. No focal bone lesion or bone destruction. Bone cortex appears intact. IMPRESSION: Degenerative changes in both hips.  No acute bony abnormalities. Electronically Signed   By: Lucienne Capers M.D.   On: 06/19/2021 20:56   CT HEAD WO CONTRAST  Result Date: 06/19/2021 CLINICAL DATA:  Head trauma EXAM: CT HEAD WITHOUT CONTRAST TECHNIQUE: Contiguous axial images were obtained from the base of the skull through the vertex without intravenous contrast. COMPARISON:  None. FINDINGS: Brain: There is no mass, hemorrhage or extra-axial collection. There is generalized atrophy without lobar predilection. Hypodensity of the white matter is most commonly associated with chronic microvascular disease. There are calcifications within the corona radiata and basal ganglia Vascular: No abnormal hyperdensity of the major intracranial arteries or dural venous sinuses. No intracranial atherosclerosis. Skull: The visualized skull base, calvarium and extracranial soft tissues are normal.  Sinuses/Orbits: No fluid levels or advanced mucosal thickening of the visualized paranasal sinuses. No mastoid or middle ear effusion. The orbits are normal. IMPRESSION: 1. No acute intracranial abnormality. 2. Atrophy and chronic microvascular ischemia. Cerebral Atrophy (ICD10-G31.9). Electronically Signed   By: Ulyses Jarred M.D.   On: 06/19/2021 20:02   CT CHEST W CONTRAST  Result Date: 06/19/2021 CLINICAL DATA:  Chest trauma, blunt. Syncopal episode with fall. Bilateral flank pain and LEFT shoulder pain. Back pain. EXAM: CT CHEST WITH CONTRAST TECHNIQUE: Multidetector CT imaging of the chest was performed during intravenous contrast administration. CONTRAST:  28mL OMNIPAQUE IOHEXOL 350 MG/ML SOLN COMPARISON:  Chest CT dated 02/27/2021. FINDINGS: Cardiovascular: Thoracic aorta appears intact and stable in configuration. Scattered atherosclerosis of the thoracic aorta and coronary arteries. No significant pericardial effusion. Mediastinum/Nodes: No mass or enlarged lymph nodes are seen within the mediastinum. Esophagus is somewhat patulous, containing recently ingested material, but otherwise unremarkable. Trachea and central bronchi are unremarkable. Lungs/Pleura: Moderate-sized LEFT pleural effusion with overlying compressive atelectasis. Mild atelectasis versus pleural thickening at the RIGHT lung base. No pneumothorax. Upper Abdomen: Hypodense lesions/masses throughout the liver, compatible with previously described hepatic metastases. Scattered areas of free fluid/ascites within the upper abdomen. Musculoskeletal: Degenerative spondylosis of the kyphotic thoracic spine, mild to moderate in degree. Anterior cervical fusion hardware within the lower cervical spine. No acute-appearing osseous abnormality. IMPRESSION: 1. Moderate-sized LEFT  pleural effusion with overlying compressive atelectasis, new compared to the earlier chest CT of 02/27/2021. 2. Known hepatic metastases, not significantly changed  compared to CT of 02/27/2021. Also similar appearance of the periportal/gastrohepatic ligament adenopathy that was also described on the earlier CT. 3. Scattered free fluid/ascites within the upper abdomen. 4. No acute osseous fracture or dislocation identified. 5. Debris versus recently ingested material within the upper esophagus. This presents a risk for potential aspiration. Aortic Atherosclerosis (ICD10-I70.0). Electronically Signed   By: Franki Cabot M.D.   On: 06/19/2021 20:33   CT CERVICAL SPINE WO CONTRAST  Result Date: 06/19/2021 CLINICAL DATA:  Neck trauma, EXAM: CT CERVICAL SPINE WITHOUT CONTRAST TECHNIQUE: Multidetector CT imaging of the cervical spine was performed without intravenous contrast. Multiplanar CT image reconstructions were also generated. COMPARISON:  None. FINDINGS: Alignment: Moderate dextroscoliosis of the cervical spine. No evidence of acute vertebral body subluxation. Skull base and vertebrae: No fracture line or displaced fracture fragment is seen. Anterior cervical fusion hardware at C5 through C7 appears intact and appropriately positioned. Soft tissues and spinal canal: No prevertebral fluid or swelling. No visible canal hematoma. Disc levels: Mild degenerative spurring at multiple levels, but no more than mild central canal stenosis at any level. Upper chest: Debris versus recently ingested material within the upper esophagus. Limited images of the upper chest are otherwise unremarkable. Other: Bilateral carotid atherosclerosis. IMPRESSION: 1. No acute findings within the cervical spine. No osseous fracture or dislocation. No evidence of osseous metastasis. 2. Anterior cervical fusion hardware at C5 through C7 appears intact and appropriately positioned. 3. Moderate scoliosis. 4. Bilateral carotid atherosclerosis. 5. Debris versus recently ingested material within the upper esophagus. This presents a potential risk for aspiration. Electronically Signed   By: Franki Cabot  M.D.   On: 06/19/2021 20:43   CT T-SPINE NO CHARGE  Result Date: 06/19/2021 CLINICAL DATA:  Chest trauma, back pain. EXAM: CT THORACIC SPINE WITHOUT CONTRAST TECHNIQUE: Multidetector CT images of the thoracic were obtained using the standard protocol without intravenous contrast. COMPARISON:  None. FINDINGS: Alignment: Moderate to severe kyphosis of the thoracic spine. No evidence of acute vertebral body subluxation. Vertebrae: No fracture line or displaced fracture fragment. No evidence of acute compression fracture deformity.w Paraspinal and other soft tissues: Moderate-sized LEFT pleural effusion with overlying compressive atelectasis, new compared to a CT chest CT of 02/27/2021. Multiple nonobstructing LEFT renal stones, largest measuring 8 mm. Known hepatic metastases, incompletely imaged. Visualized paraspinal soft tissues are otherwise unremarkable. Disc levels: Degenerative spondylosis throughout the thoracolumbar spine, mild to moderate in degree, with associated mild-to-moderate central canal stenoses at multiple levels. IMPRESSION: 1. No acute findings within the thoracic spine. No acute osseous fracture or dislocation within the thoracic spine. No evidence of osseous metastasis within the thoracic spine. 2. Kyphosis and degenerative changes of the thoracic spine, as detailed above. 3. Moderate-sized LEFT pleural effusion with overlying compressive atelectasis, new compared to a recent chest CT of 02/27/2021. 4. Known hepatic metastases, incompletely imaged. 5. Nonobstructing LEFT renal stones. Electronically Signed   By: Franki Cabot M.D.   On: 06/19/2021 20:39   DG Lumbar Spine 2-3Vclearing  Result Date: 06/19/2021 CLINICAL DATA:  Low back pain after fall. EXAM: LIMITED LUMBAR SPINE FOR TRAUMA CLEARING - 2-3 VIEW COMPARISON:  September 13, 2015. FINDINGS: Status post surgical posterior fusion of L3-4 and L4-5 with bilateral intrapedicular screw placement and interbody fusion. No fracture or  spondylolisthesis is noted. IMPRESSION: Postsurgical changes as described above.  No acute abnormality seen.  Electronically Signed   By: Marijo Conception M.D.   On: 06/19/2021 20:55     LOS: 1 day   Antonieta Pert, MD [facility]  06/20/2021, 11:52 AM

## 2021-06-20 NOTE — H&P (Signed)
History and Physical    Erik Fletcher. JKD:[id number] DOB: 16-Jan-1951 DOA: 06/19/2021  PCP: Lawerance Cruel, MD  Patient coming from: Home  I have personally briefly reviewed patient's old medical records in [place]  Chief Complaint: Syncope and fall  HPI: Erik Fletcher. is a 70 y.o. 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  Patient reports that yesterday he was walking to the bathroom and suddenly had loss of consciousness and fell backwards.  Having worsening back pain.  He has been having syncopal episodes with falls like this several times a month.  Oncology thought likely due to orthostatic hypotension and started him on midodrine in November. Also had AM cortisol level checked on 05/04/21 that was mildly elevated at 28. Does not yet appear to have had a cardiac work-up. He denies any chest pain, palpitation, shortness of breath.  No nausea, vomiting or diarrhea.   ED Course: He had temperature of 35F, normotensive and stable on room air. WBC of 21K, hemoglobin of 10, lactate of 4 Sodium of 134, K of 3.9, creatinine was 0.85, BG of 93  CT head and cervical spine was negative for acute findings.  CT chest shows moderate-sized left pleural effusion with compressive atelectasis.  There is also debris versus ingested material within the upper esophagus concerning for aspiration.  Lumbar spine and pelvis was negative.  Review of Systems: Constitutional: No Weight Change, No Fever ENT/Mouth: No sore throat, No Rhinorrhea Eyes: + Eye burning on right, No Vision Changes Cardiovascular: No Chest Pain, no SOB,No Edema Respiratory: No Cough, No Sputum, No Wheezing, no Dyspnea  Gastrointestinal: No Nausea, No Vomiting, No Diarrhea, No Constipation, No Pain Genitourinary: no Urinary Incontinence, No Urgency, No Flank  Pain Musculoskeletal: No Arthralgias, No Myalgias Skin: No Skin Lesions, No Pruritus, Neuro: no Weakness, No Numbness,  + Loss of Consciousness, + Syncope Psych: No Anxiety/Panic, No Depression, no decrease appetite Heme/Lymph: No Bruising, No Bleeding  Past Medical History:  Diagnosis Date   Allergy    seasonal allergies   Diabetes mellitus without complication (HCC)    Glaucoma    Neuromuscular disorder (HCC)    numbness/tingling legs to feet   PONV (postoperative nausea and vomiting)     Past Surgical History:  Procedure Laterality Date   BACK SURGERY     09/2009   BACK SURGERY  2013   CERVICAL SPINE SURGERY     2011   EYE SURGERY     09/2011 left eye   IR IMAGING GUIDED PORT INSERTION  12/27/2020   SHOULDER SURGERY     right  3/83     reports that he has never smoked. He has never used smokeless tobacco. He reports that he does not drink alcohol and does not use drugs. Social History  No Known Allergies  Family History  Problem Relation Age of Onset   Heart disease Mother    Dementia Father    Heart disease Father    Prostate cancer Brother    Other Daughter    Other Daughter    Colon cancer Neg Hx    Colon polyps Neg Hx    Esophageal cancer Neg Hx    Rectal cancer Neg Hx    Stomach cancer Neg Hx      Prior to Admission medications   Medication Sig Start Date End Date Taking? Authorizing Provider  acetaminophen (TYLENOL) 500  MG tablet Take 1,000 mg by mouth every 6 (six) hours as needed for moderate pain or headache. Patient not taking: Reported on 05/11/2021    [provider]  ciprofloxacin (CIPRO) 500 MG tablet Take 1 tablet (500 mg total) by mouth 2 (two) times daily. 06/06/21   Ladell Pier, MD  COMBIGAN 0.2-0.5 % ophthalmic solution Place 1 drop into the right eye 2 (two) times daily. 09/09/19   [provider]  EUTHYROX 50 MCG tablet Take 50 mcg by mouth every morning. 10/31/20   [provider]  glucose monitoring kit  (FREESTYLE) monitoring kit 1 each by Does not apply route as needed for other. 10/12/19   Rancour, Annie Main, MD  HYDROcodone-acetaminophen (NORCO) 5-325 MG tablet Take 0.5-1 tablets by mouth every 6 (six) hours as needed for moderate pain. Patient not taking: Reported on 05/11/2021 03/16/21   Owens Shark, NP  ibuprofen (ADVIL) 200 MG tablet Take 400 mg by mouth every 6 (six) hours as needed for headache or moderate pain. Patient not taking: Reported on 02/16/2021    [provider]  latanoprost (XALATAN) 0.005 % ophthalmic solution Place 1 drop into the right eye at bedtime.     [provider]  lidocaine-prilocaine (EMLA) cream Apply 1 application topically as directed. Apply to port site 2 hours prior to stick and cover with plastic wrap to numb site 12/20/20   Ladell Pier, MD  magnesium oxide (MAG-OX) 400 (240 Mg) MG tablet Take 1 tablet (400 mg total) by mouth 2 (two) times daily. 06/07/21   Ladell Pier, MD  metFORMIN (GLUCOPHAGE) 500 MG tablet Take 1 tablet (500 mg total) by mouth 2 (two) times daily with a meal. 10/12/19   Rancour, Annie Main, MD  midodrine (PROAMATINE) 5 MG tablet Take 1 tablet (5 mg total) by mouth 2 (two) times daily with a meal. Take 5 mg at 8 am and take 5 mg at 2 pm 05/04/21   Ladell Pier, MD  Multiple Vitamin (MULTIVITAMIN PO) Take 1 tablet by mouth daily.    [provider]  ondansetron (ZOFRAN) 8 MG tablet Take 1 tablet (8 mg total) by mouth every 8 (eight) hours as needed for nausea or vomiting. Start 72 hours after each IV chemotherapy treatment Patient not taking: Reported on 06/06/2021 12/20/20   Ladell Pier, MD  phenylephrine (4-WAY FAST ACTING) 1 % nasal spray Place 1 drop into both nostrils daily as needed for congestion.    [provider]  Polyethyl Glyc-Propyl Glyc PF (SYSTANE ULTRA PF) 0.4-0.3 % SOLN Place 1 drop into both eyes daily as needed (dryness).    [provider]  potassium chloride SA  (KLOR-CON M) 20 MEQ tablet Take 1 tablet (20 mEq total) by mouth daily. 06/06/21   Ladell Pier, MD  prochlorperazine (COMPAZINE) 10 MG tablet Take 1 tablet (10 mg total) by mouth every 6 (six) hours as needed for nausea or vomiting. Patient not taking: Reported on 03/23/2021 12/20/20   Ladell Pier, MD  RESTASIS 0.05 % ophthalmic emulsion Place 1 drop into both eyes 2 (two) times daily. 04/17/15   [provider]  triamcinolone cream (KENALOG) 0.1 % Apply 1 application topically in the morning and at bedtime. Patient not taking: Reported on 03/02/2021 12/04/20   [provider]    Physical Exam: Vitals:   06/19/21 2100 06/19/21 2115 06/19/21 2124 06/20/21 0003  BP:  (!) 153/99 (!) 153/99 (!) 149/101  Pulse: 92 87 79 84  Resp: 19  19 16   Temp:    98.2 F (36.8 C)  TempSrc:    Oral  SpO2: 91% 99% 92% 98%    Constitutional: NAD, calm, comfortable, elderly male laying in bed Vitals:   06/19/21 2100 06/19/21 2115 06/19/21 2124 06/20/21 0003  BP:  (!) 153/99 (!) 153/99 (!) 149/101  Pulse: 92 87 79 84  Resp: 19  19 16   Temp:    98.2 F (36.8 C)  TempSrc:    Oral  SpO2: 91% 99% 92% 98%   Eyes: Purulent discharge and erythematous conjunctiva of the right eye ENMT: Mucous membranes are moist. Neck: normal, supple Respiratory: clear to auscultation bilaterally, no wheezing, no crackles. Normal respiratory effort on room air. No accessory muscle use.  Cardiovascular: Regular rate and rhythm, no murmurs / rubs / gallops. No extremity edema.  Abdomen: no tenderness, no masses palpated.  Bowel sounds positive.  Musculoskeletal: no clubbing / cyanosis. No joint deformity upper and lower extremities. Good ROM, no contractures. Normal muscle tone.  Skin: erythematous dorsal left hand and elbow with skin abrasions and bruising Neurologic: CN 2-12 grossly intact. Strength 5/5 in all 4.  Psychiatric: Normal judgment and insight. Alert and oriented x 3. Normal mood.    Labs  on Admission: I have personally reviewed following labs and imaging studies  CBC: Recent Labs  Lab 06/19/21 2005  WBC 21.2*  NEUTROABS 17.8*  HGB 10.0*  HCT 30.0*  MCV 100.0  PLT 021   Basic Metabolic Panel: Recent Labs  Lab 06/19/21 2005  NA 134*  K 3.9  CL 98  CO2 24  GLUCOSE 93  BUN 14  CREATININE 0.85  CALCIUM 8.5*  MG 1.8   GFR: Estimated Creatinine Clearance: 76 mL/min (by C-G formula based on SCr of 0.85 mg/dL). Liver Function Tests: Recent Labs  Lab 06/19/21 2005  AST 46*  ALT 21  ALKPHOS 440*  BILITOT 1.0  PROT 6.1*  ALBUMIN 2.5*   No results for input(s): LIPASE, AMYLASE in the last 168 hours. No results for input(s): AMMONIA in the last 168 hours. Coagulation Profile: Recent Labs  Lab 06/19/21 2005  INR 1.1   Cardiac Enzymes: Recent Labs  Lab 06/19/21 2005  CKTOTAL 59   BNP (last 3 results) No results for input(s): PROBNP in the last 8760 hours. HbA1C: No results for input(s): HGBA1C in the last 72 hours. CBG: No results for input(s): GLUCAP in the last 168 hours. Lipid Profile: No results for input(s): CHOL, HDL, LDLCALC, TRIG, CHOLHDL, LDLDIRECT in the last 72 hours. Thyroid Function Tests: No results for input(s): TSH, T4TOTAL, FREET4, T3FREE, THYROIDAB in the last 72 hours. Anemia Panel: No results for input(s): VITAMINB12, FOLATE, FERRITIN, TIBC, IRON, RETICCTPCT in the last 72 hours. Urine analysis:    Component Value Date/Time   COLORURINE STRAW (A) 10/11/2019 1924   APPEARANCEUR CLEAR 10/11/2019 1924   LABSPEC 1.029 10/11/2019 1924   PHURINE 5.0 10/11/2019 1924   GLUCOSEU >=500 (A) 10/11/2019 1924   HGBUR NEGATIVE 10/11/2019 1924   BILIRUBINUR NEGATIVE 10/11/2019 1924   KETONESUR NEGATIVE 10/11/2019 1924   PROTEINUR NEGATIVE 10/11/2019 1924   NITRITE NEGATIVE 10/11/2019 1924   LEUKOCYTESUR NEGATIVE 10/11/2019 1924    Radiological Exams on Admission: DG Pelvis 1-2 Views  Result Date: 06/19/2021 CLINICAL DATA:   Mechanical fall after syncopal episode. EXAM: PELVIS - 1-2 VIEW COMPARISON:  None. FINDINGS: Residual contrast material in the bladder. Postoperative changes in the lower lumbar spine. Degenerative changes in both hips with  narrowed acetabular joint and sclerosis and osteophyte formation. No evidence of acute fracture or dislocation. No focal bone lesion or bone destruction. Bone cortex appears intact. IMPRESSION: Degenerative changes in both hips.  No acute bony abnormalities. Electronically Signed   By: Lucienne Capers M.D.   On: 06/19/2021 20:56   CT HEAD WO CONTRAST  Result Date: 06/19/2021 CLINICAL DATA:  Head trauma EXAM: CT HEAD WITHOUT CONTRAST TECHNIQUE: Contiguous axial images were obtained from the base of the skull through the vertex without intravenous contrast. COMPARISON:  None. FINDINGS: Brain: There is no mass, hemorrhage or extra-axial collection. There is generalized atrophy without lobar predilection. Hypodensity of the white matter is most commonly associated with chronic microvascular disease. There are calcifications within the corona radiata and basal ganglia Vascular: No abnormal hyperdensity of the major intracranial arteries or dural venous sinuses. No intracranial atherosclerosis. Skull: The visualized skull base, calvarium and extracranial soft tissues are normal. Sinuses/Orbits: No fluid levels or advanced mucosal thickening of the visualized paranasal sinuses. No mastoid or middle ear effusion. The orbits are normal. IMPRESSION: 1. No acute intracranial abnormality. 2. Atrophy and chronic microvascular ischemia. Cerebral Atrophy (ICD10-G31.9). Electronically Signed   By: Ulyses Jarred M.D.   On: 06/19/2021 20:02   CT CHEST W CONTRAST  Result Date: 06/19/2021 CLINICAL DATA:  Chest trauma, blunt. Syncopal episode with fall. Bilateral flank pain and LEFT shoulder pain. Back pain. EXAM: CT CHEST WITH CONTRAST TECHNIQUE: Multidetector CT imaging of the chest was performed during  intravenous contrast administration. CONTRAST:  40m OMNIPAQUE IOHEXOL 350 MG/ML SOLN COMPARISON:  Chest CT dated 02/27/2021. FINDINGS: Cardiovascular: Thoracic aorta appears intact and stable in configuration. Scattered atherosclerosis of the thoracic aorta and coronary arteries. No significant pericardial effusion. Mediastinum/Nodes: No mass or enlarged lymph nodes are seen within the mediastinum. Esophagus is somewhat patulous, containing recently ingested material, but otherwise unremarkable. Trachea and central bronchi are unremarkable. Lungs/Pleura: Moderate-sized LEFT pleural effusion with overlying compressive atelectasis. Mild atelectasis versus pleural thickening at the RIGHT lung base. No pneumothorax. Upper Abdomen: Hypodense lesions/masses throughout the liver, compatible with previously described hepatic metastases. Scattered areas of free fluid/ascites within the upper abdomen. Musculoskeletal: Degenerative spondylosis of the kyphotic thoracic spine, mild to moderate in degree. Anterior cervical fusion hardware within the lower cervical spine. No acute-appearing osseous abnormality. IMPRESSION: 1. Moderate-sized LEFT pleural effusion with overlying compressive atelectasis, new compared to the earlier chest CT of 02/27/2021. 2. Known hepatic metastases, not significantly changed compared to CT of 02/27/2021. Also similar appearance of the periportal/gastrohepatic ligament adenopathy that was also described on the earlier CT. 3. Scattered free fluid/ascites within the upper abdomen. 4. No acute osseous fracture or dislocation identified. 5. Debris versus recently ingested material within the upper esophagus. This presents a risk for potential aspiration. Aortic Atherosclerosis (ICD10-I70.0). Electronically Signed   By: SFranki CabotM.D.   On: 06/19/2021 20:33   CT CERVICAL SPINE WO CONTRAST  Result Date: 06/19/2021 CLINICAL DATA:  Neck trauma, EXAM: CT CERVICAL SPINE WITHOUT CONTRAST TECHNIQUE:  Multidetector CT imaging of the cervical spine was performed without intravenous contrast. Multiplanar CT image reconstructions were also generated. COMPARISON:  None. FINDINGS: Alignment: Moderate dextroscoliosis of the cervical spine. No evidence of acute vertebral body subluxation. Skull base and vertebrae: No fracture line or displaced fracture fragment is seen. Anterior cervical fusion hardware at C5 through C7 appears intact and appropriately positioned. Soft tissues and spinal canal: No prevertebral fluid or swelling. No visible canal hematoma. Disc levels: Mild degenerative spurring at multiple  levels, but no more than mild central canal stenosis at any level. Upper chest: Debris versus recently ingested material within the upper esophagus. Limited images of the upper chest are otherwise unremarkable. Other: Bilateral carotid atherosclerosis. IMPRESSION: 1. No acute findings within the cervical spine. No osseous fracture or dislocation. No evidence of osseous metastasis. 2. Anterior cervical fusion hardware at C5 through C7 appears intact and appropriately positioned. 3. Moderate scoliosis. 4. Bilateral carotid atherosclerosis. 5. Debris versus recently ingested material within the upper esophagus. This presents a potential risk for aspiration. Electronically Signed   By: Franki Cabot M.D.   On: 06/19/2021 20:43   CT T-SPINE NO CHARGE  Result Date: 06/19/2021 CLINICAL DATA:  Chest trauma, back pain. EXAM: CT THORACIC SPINE WITHOUT CONTRAST TECHNIQUE: Multidetector CT images of the thoracic were obtained using the standard protocol without intravenous contrast. COMPARISON:  None. FINDINGS: Alignment: Moderate to severe kyphosis of the thoracic spine. No evidence of acute vertebral body subluxation. Vertebrae: No fracture line or displaced fracture fragment. No evidence of acute compression fracture deformity.w Paraspinal and other soft tissues: Moderate-sized LEFT pleural effusion with overlying  compressive atelectasis, new compared to a CT chest CT of 02/27/2021. Multiple nonobstructing LEFT renal stones, largest measuring 8 mm. Known hepatic metastases, incompletely imaged. Visualized paraspinal soft tissues are otherwise unremarkable. Disc levels: Degenerative spondylosis throughout the thoracolumbar spine, mild to moderate in degree, with associated mild-to-moderate central canal stenoses at multiple levels. IMPRESSION: 1. No acute findings within the thoracic spine. No acute osseous fracture or dislocation within the thoracic spine. No evidence of osseous metastasis within the thoracic spine. 2. Kyphosis and degenerative changes of the thoracic spine, as detailed above. 3. Moderate-sized LEFT pleural effusion with overlying compressive atelectasis, new compared to a recent chest CT of 02/27/2021. 4. Known hepatic metastases, incompletely imaged. 5. Nonobstructing LEFT renal stones. Electronically Signed   By: Franki Cabot M.D.   On: 06/19/2021 20:39   DG Lumbar Spine 2-3Vclearing  Result Date: 06/19/2021 CLINICAL DATA:  Low back pain after fall. EXAM: LIMITED LUMBAR SPINE FOR TRAUMA CLEARING - 2-3 VIEW COMPARISON:  September 13, 2015. FINDINGS: Status post surgical posterior fusion of L3-4 and L4-5 with bilateral intrapedicular screw placement and interbody fusion. No fracture or spondylolisthesis is noted. IMPRESSION: Postsurgical changes as described above.  No acute abnormality seen. Electronically Signed   By: Marijo Conception M.D.   On: 06/19/2021 20:55      Assessment/Plan  Syncope with LOC -has been recurrent. Placed on midodrine by oncology for orthostatic hypotension -check orthostatic vital signs here -keep on telemetry  -obtain echocardiogram to access for any cardiomyopathy while on chemo  -check AM TSH  Left sided pleural effusion concerning for parapneumonic effusion -started on IV vanc, cefepime. Switch to Unasyn for concerns for aspiration seen on CT chest   -thoracentesis with fluid studies ordered -check urine strep and legionella antigen   Mild left hand cellulitis  -pt had dog scratch. Received tetanus booster in ED -Already on IV Unasyn  Right bacterial conjunctivitis -Erythromycin ointment TID   Lactic acidosis secondary to ongoing infection  continue to trend -continuous IV fluids overnight   Metastatic cholangiocarcinoma on chemotherapy  follows with Dr. Benay Spice oncology   DVT prophylaxis:.SCD Code Status: Full Family Communication: Plan discussed with patient and daughter at bedside  disposition Plan: Home with at least 2 midnight stays  Consults called:  Admission status: inpatient Level of care: Med-Surg  Status is: Inpatient  Remains inpatient appropriate because: Admit - It  is my clinical opinion that admission to INPATIENT is reasonable and necessary because this patient will require at least 2 midnights in the hospital to treat this condition based on the medical complexity of the problems presented.  Given the aforementioned information, the predictability of an adverse outcome is felt to be significant.         Erik Desanctis DO [facility]   If 7PM-7AM, please contact night-coverage [email or web address]   06/20/2021, 12:49 AM

## 2021-06-20 NOTE — Progress Notes (Signed)
Pharmacy Antibiotic Note  Erik Fletcher. is a 70 y.o. male admitted on 06/19/2021 with fall, this is reportedly in the setting of a syncopal episode which the pt states is frequent for him.  Pharmacy has been consulted to dose unasyn for asp pna  Plan: Unasyn 3gm IV q6h Follow renal function and clinical course     Temp (24hrs), Avg:98.6 F (37 C), Min:98.2 F (36.8 C), Max:99 F (37.2 C)  Recent Labs  Lab 06/19/21 2005 06/19/21 2210  WBC 21.2*  --   CREATININE 0.85  --   LATICACIDVEN  --  4.0*    Estimated Creatinine Clearance: 76 mL/min (by C-G formula based on SCr of 0.85 mg/dL).    No Known Allergies   Thank you for allowing pharmacy to be a part of this patients care.  Dolly Rias RPh 06/20/2021, 12:32 AM

## 2021-06-20 NOTE — Progress Notes (Signed)
°   06/20/21 1100  Orthostatic Lying   BP- Lying 110/76  Pulse- Lying 111  Orthostatic Sitting  BP- Sitting (!) 88/63  Pulse- Sitting 96    Patient unable to complete standing portion of orthostatic vital signs d/t dizziness.

## 2021-06-20 NOTE — Procedures (Signed)
Interventional Radiology Procedure Note  Procedure: US guided left thoracentesis.  ~325cc's fluid for labs.  Complications: None Recommendations:  - routine wound care - Do not submerge for 7 days    Signed,  Dulcy Fanny. Earleen Newport, DO

## 2021-06-21 ENCOUNTER — Inpatient Hospital Stay (HOSPITAL_COMMUNITY): Payer: Medicare Other

## 2021-06-21 DIAGNOSIS — R55 Syncope and collapse: Secondary | ICD-10-CM

## 2021-06-21 DIAGNOSIS — J90 Pleural effusion, not elsewhere classified: Secondary | ICD-10-CM | POA: Diagnosis not present

## 2021-06-21 LAB — ECHOCARDIOGRAM COMPLETE
AR max vel: 2.61 cm2
AV Area VTI: 2.65 cm2
AV Area mean vel: 2.79 cm2
AV Mean grad: 2 mmHg
AV Peak grad: 3.4 mmHg
Ao pk vel: 0.92 m/s
Area-P 1/2: 4.57 cm2
Calc EF: 56.7 %
Single Plane A2C EF: 56.9 %
Single Plane A4C EF: 55.6 %

## 2021-06-21 LAB — BLOOD GAS, VENOUS
Acid-base deficit: 1.1 mmol/L (ref 0.0–2.0)
Bicarbonate: 22.4 mmol/L (ref 20.0–28.0)
O2 Saturation: 89.1 %
Patient temperature: 98.6
pCO2, Ven: 34.7 mmHg — ABNORMAL LOW (ref 44.0–60.0)
pH, Ven: 7.425 (ref 7.250–7.430)
pO2, Ven: 57.6 mmHg — ABNORMAL HIGH (ref 32.0–45.0)

## 2021-06-21 LAB — CBC
HCT: 31.4 % — ABNORMAL LOW (ref 39.0–52.0)
Hemoglobin: 10.3 g/dL — ABNORMAL LOW (ref 13.0–17.0)
MCH: 33.6 pg (ref 26.0–34.0)
MCHC: 32.8 g/dL (ref 30.0–36.0)
MCV: 102.3 fL — ABNORMAL HIGH (ref 80.0–100.0)
Platelets: 246 10*3/uL (ref 150–400)
RBC: 3.07 MIL/uL — ABNORMAL LOW (ref 4.22–5.81)
RDW: 16.1 % — ABNORMAL HIGH (ref 11.5–15.5)
WBC: 29.1 10*3/uL — ABNORMAL HIGH (ref 4.0–10.5)
nRBC: 0 % (ref 0.0–0.2)

## 2021-06-21 LAB — GLUCOSE, CAPILLARY: Glucose-Capillary: 117 mg/dL — ABNORMAL HIGH (ref 70–99)

## 2021-06-21 LAB — BASIC METABOLIC PANEL
Anion gap: 11 (ref 5–15)
BUN: 10 mg/dL (ref 8–23)
CO2: 22 mmol/L (ref 22–32)
Calcium: 7.7 mg/dL — ABNORMAL LOW (ref 8.9–10.3)
Chloride: 100 mmol/L (ref 98–111)
Creatinine, Ser: 0.87 mg/dL (ref 0.61–1.24)
GFR, Estimated: >60 mL/min (ref >60)
Glucose, Bld: 129 mg/dL — ABNORMAL HIGH (ref 70–99)
Potassium: 4 mmol/L (ref 3.5–5.1)
Sodium: 133 mmol/L — ABNORMAL LOW (ref 135–145)

## 2021-06-21 LAB — LACTIC ACID, PLASMA: Lactic Acid, Venous: 6.3 mmol/L (ref 0.5–1.9)

## 2021-06-21 LAB — TROPONIN I (HIGH SENSITIVITY): Troponin I (High Sensitivity): 14 ng/L (ref <18)

## 2021-06-21 LAB — MISC LABCORP TEST (SEND OUT): LabCorp test name: 5367

## 2021-06-21 LAB — T3, FREE: T3, Free: 0.6 pg/mL — ABNORMAL LOW (ref 2.0–4.4)

## 2021-06-21 MED ORDER — LACTATED RINGERS IV SOLN: INTRAVENOUS | Status: DC

## 2021-06-21 NOTE — Progress Notes (Addendum)
PROGRESS NOTE    Erik Fletcher.  NAT:[id number] DOB: Feb 07, 1951 DOA: 06/19/2021 PCP: Lawerance Cruel, MD   Chief Complaint  Patient presents with   Fall  Brief Narrative/Hospital Course: Erik Fletcher., 70 y.o. 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  On 12/26 he was walking to the bathroom and suddenly had loss of consciousness and fell backwards and was having back pain.  Has been having syncopal episodes with falls several times a month seen by oncology felt to be orthostatic hypotension and was started on midodrine in November and also had his cortisol checked on 11/11 that was up at 28.  Patient denies any chest pain palpitation shortness of breath nausea vomiting.  In the ED hemodynamically stable. WBC of 21K, hemoglobin of 10,lactate of 4. Sodium of 134, K of 3.9, creatinine was 0.85, BG of 93 CT head and cervical spine was negative for acute findings. CT chest shows moderate-sized left pleural effusion with compressive atelectasis.  There is also debris versus ingested material within the upper esophagus concerning for aspiration. Lumbar spine and pelvis was negative.   Subjective: Seen and examined this morning.  Patient is alert awake oriented to self place current president unable to tell me the date mildly confused, but aware about what is going on he says Overnight vitals stable, afebrile Denies nausea vomiting, has tenderness in right upper quadrant.  Assessment & Plan:  Recurrent syncope with loss of consciousness and falls: Cortisol level was up, at 28 In November-felt to be orthostatic and was placed on midodrine in November by his oncology.  Increased to 5 mg 3 times daily, given multiple fluid boluses.  Echocardiogram pending.   Metastatic cholangiocarcinoma on chemotherapy by Dr. Learta Codding.  CT chest showed hypodense lesions/masses  throughout the liver consistent with hepatic metastasis scattered areas of free fluid/ascites in the upper abdomen.Discussed with Dr. Learta Codding, he is recommending home with hospice and he has placed a referral.Patient is full code discussed with him he defers to his wife and would be agreeable with DNR if wife agrees. Addendum at 3:26 pm- I spoke with his we discussed in details- she understands that overall prognosis is poor, agrees with DNR. If he is stable he may come home with hospice if not or if worsens may need inpatient hospice. We are continuing on supportive care.  Acute metabolic encephalopathy/hospital-acquired delirium in the setting of his metastatic cancer, hospitalization.  Continue supportive care fall precaution. Left-sided pleural effusion with concern for parapneumonic effusion seen on the CT chest:In the setting of immunocompromise status placed on IV Unasyn/cefepime.S/p US thoracentesis  325 ml fluid removed-post procedure chest x-ray with no pneumothorax and no significant pleural effusion 12/28,  pleural fluid gram stain no organism, cytology pending , total pleural protein 3.2 , serum protein 6.2.  LDH elevated 279 .  Legionella/sterp  urine antigen pending.  Mild left hand cellulitis patient is now on Unasyn .he had dog scratch.  Received tetanus booster in the ED  Lactic acidosis: Unclear etiology could be multifactorial was on metformin at home now here with pleural effusion, has  metastatic cholangiocarcinoma,-causing delay in clearance-  was discussed with PCCM, could be due to high tumor burden, patient was given multiple boluses, remains hemodynamically stable, afebrile.  Amylase lipase normal troponin negative- no evidence of MI no evidence of sepsis so far. Recent Labs  Lab 06/20/21 1154 06/20/21 1525 06/21/21 0500  LATICACIDVEN  4.1* 5.3* 6.3*    Right bacterial conjunctivitis on erythromycin ointment 3 times daily  Leukocytosis:Count was WBC 1K on 12/14 on  admission 21.2K and uptrending.  Goals of care: Discussed with patient, discussed with oncology, oncology recommending home with hospice he has discussed this with his wife and the patient and both of them are agreeable.  Call the wife to discuss the CODE STATUS unable to reach left a number for call back.  TOC has been consulted for hospice eval. patient is not decided on CODE STATUS yet.  Overall very poor prognosis  DVT prophylaxis: enoxaparin (LOVENOX) injection 40 mg Start: 06/21/21 1000 SCDs Start: 06/20/21 0008 Code Status:   Code Status: Full Code Family Communication: plan of care discussed with patient at bedside.  Called his wife multiple times left message to voicemail. Status is: Inpatient Remains inpatient appropriate because: Ongoing management of pleural effusion orthostatic hypotension leukocytosis lactic acidosis Disposition: Currently not medically stable for discharge. Anticipated Disposition: Hospice  Objective: Vitals last 24 hrs: Vitals:   06/20/21 1750 06/20/21 2121 06/21/21 0450 06/21/21 0810  BP: (!) 132/91 (!) 146/88 (!) 139/97 (!) 134/100  Pulse: 74 79 81 (!) 114  Resp: 18 18 14 18   Temp: 98 F (36.7 C) 98.6 F (37 C) 98 F (36.7 C) 99.1 F (37.3 C)  TempSrc: Oral Oral Oral Oral  SpO2: 96% 95% 93%    Weight change:   Intake/Output Summary (Last 24 hours) at 06/21/2021 1027 Last data filed at 06/21/2021 0936 Gross per 24 hour  Intake 4855.21 ml  Output 1450 ml  Net 3405.21 ml   Net IO Since Admission: 4,161.34 mL [06/21/21 1027]   Physical Examination: General exam: AAOx 2, older than stated age, weak appearing. HEENT:Oral mucosa moist, Ear/Nose WNL grossly, dentition normal. Respiratory system: bilaterally clear, no use of accessory muscle Cardiovascular system: S1 & S2 +, No JVD,. Gastrointestinal system: Abdomen soft, tender right upper quadrant,ND, BS+ Nervous System:Alert, awake, moving extremities and grossly nonfocal Extremities: no  edema, distal peripheral pulses palpable.  Skin: No rashes,no icterus. MSK: Normal muscle bulk,tone, power   Medications reviewed:  Scheduled Meds:  brimonidine  1 drop Right Eye BID   And   timolol  1 drop Right Eye BID   Chlorhexidine Gluconate Cloth  6 each Topical Daily   cycloSPORINE  1 drop Both Eyes BID   enoxaparin (LOVENOX) injection  40 mg Subcutaneous Q24H   erythromycin   Both Eyes Once   erythromycin   Right Eye Q6H   latanoprost  1 drop Right Eye QHS   levothyroxine  100 mcg Oral q morning   midodrine  5 mg Oral TID WC   multivitamin with minerals  1 tablet Oral Daily   Continuous Infusions:  sodium chloride 150 mL/hr at 06/20/21 1814   ampicillin-sulbactam (UNASYN) IV 3 g (06/21/21 0741)   ceFEPime (MAXIPIME) IV      Diet Order             Diet regular Room service appropriate? Yes; Fluid consistency: Thin  Diet effective now                  Weight change:   Wt Readings from Last 3 Encounters:  06/06/21 78.9 kg  05/24/21 79.7 kg  05/11/21 77.6 kg   Consultants:see note  Procedures:see note Antimicrobials: Anti-infectives (From admission, onward)    Start     Dose/Rate Route Frequency Ordered Stop   06/20/21 0200  Ampicillin-Sulbactam (UNASYN) 3 g in sodium  chloride 0.9 % 100 mL IVPB        3 g 200 mL/hr over 30 Minutes Intravenous Every 6 hours 06/20/21 0022     06/19/21 2200  vancomycin (VANCOREADY) IVPB 1750 mg/350 mL        1,750 mg 175 mL/hr over 120 Minutes Intravenous  Once 06/19/21 2127 06/20/21 0856   06/19/21 2130  vancomycin (VANCOCIN) IVPB 1000 mg/200 mL premix  Status:  Discontinued        1,000 mg 200 mL/hr over 60 Minutes Intravenous  Once 06/19/21 2119 06/19/21 2125   06/19/21 2130  ceFEPIme (MAXIPIME) 2 g in sodium chloride 0.9 % 100 mL IVPB        2 g 200 mL/hr over 30 Minutes Intravenous  Once 06/19/21 2119        Culture/Microbiology    Component Value Date/Time   SDES  06/20/2021 1428    PLEURAL Performed at [facility], [place] [address], [place], [place] [address]    SPECREQUEST  06/20/2021 1428    NONE Performed at [facility], [place] [address], [place], [place] [address]    CULT  06/20/2021 1428    NO GROWTH < 12 HOURS Performed at [facility] [address], [place], [place] [address]    REPTSTATUS PENDING 06/20/2021 1428    Other culture-see note  Unresulted Labs (From admission, onward)     Start     Ordered   06/21/21 5621  Basic metabolic panel  Daily,   R     Question:  Specimen collection method  Answer:  Lab=Lab collect   06/20/21 1153   06/21/21 0500  CBC  Daily,   R     Question:  Specimen collection method  Answer:  Lab=Lab collect   06/20/21 1153   06/20/21 1429  Miscellaneous [facility] test (send-out)  RELEASE UPON ORDERING,   TIMED        06/20/21 1429   06/20/21 0108  Legionella Pneumophila Serogp 1 Ur Ag  Once,   R        06/20/21 0107   06/20/21 0107  Strep pneumoniae urinary antigen  Once,   R        06/20/21 0107   06/19/21 2109  Urine Culture  (Septic presentation on arrival (screening labs, nursing and treatment orders for obvious sepsis))  ONCE - STAT,   STAT       Question:  Indication  Answer:  Altered mental status (if no other cause identified)   06/19/21 2119   06/19/21 1911  Urinalysis, Routine w reflex microscopic  (Trauma Panel)  Once,   STAT        06/19/21 1913           Data Reviewed: I have personally reviewed following labs and imaging studies CBC: Recent Labs  Lab 06/19/21 2005 06/20/21 0551 06/21/21 0500  WBC 21.2* 21.8* 29.1*  NEUTROABS 17.8*  --   --   HGB 10.0* 9.9* 10.3*  HCT 30.0* 29.9* 31.4*  MCV 100.0 101.4* 102.3*  PLT 279 258 308   Basic Metabolic Panel: Recent Labs  Lab 06/19/21 2005 06/20/21 0551 06/21/21 0500  NA 134* 134* 133*  K 3.9 3.7 4.0  CL 98 99 100  CO2 24 25 22   GLUCOSE 93 134* 129*  BUN 14 13 10   CREATININE 0.85 0.89 0.87  CALCIUM 8.5* 8.5* 7.7*  MG 1.8  --    --    GFR: CrCl cannot  be calculated (Unknown ideal weight.). Liver Function Tests: Recent Labs  Lab 06/19/21 2005  AST 46*  ALT 21  ALKPHOS 440*  BILITOT 1.0  PROT 6.1*  ALBUMIN 2.5*   Recent Labs  Lab 06/20/21 1637  LIPASE 18  AMYLASE 84   No results for input(s): AMMONIA in the last 168 hours. Coagulation Profile: Recent Labs  Lab 06/19/21 2005  INR 1.1   Cardiac Enzymes: Recent Labs  Lab 06/19/21 2005  CKTOTAL 59   BNP (last 3 results) No results for input(s): PROBNP in the last 8760 hours. HbA1C: No results for input(s): HGBA1C in the last 72 hours. CBG: Recent Labs  Lab 06/21/21 0808  GLUCAP 117*   Lipid Profile: No results for input(s): CHOL, HDL, LDLCALC, TRIG, CHOLHDL, LDLDIRECT in the last 72 hours. Thyroid Function Tests: Recent Labs    06/20/21 0551 06/20/21 1525  TSH 39.992*  --   FREET4  --  0.60*  T3FREE  --  0.6*   Anemia Panel: No results for input(s): VITAMINB12, FOLATE, FERRITIN, TIBC, IRON, RETICCTPCT in the last 72 hours. Sepsis Labs: Recent Labs  Lab 06/20/21 3614 06/20/21 1154 06/20/21 1525 06/21/21 0500  LATICACIDVEN 4.6* 4.1* 5.3* 6.3*    Recent Results (from the past 240 hour(s))  Resp Panel by RT-PCR (Flu A&B, Covid) Nasopharyngeal Swab     Status: None   Collection Time: 06/19/21  8:05 PM   Specimen: Nasopharyngeal Swab; Nasopharyngeal(NP) swabs in vial transport medium  Result Value Ref Range Status   SARS Coronavirus 2 by RT PCR NEGATIVE NEGATIVE Final    Comment: (NOTE) SARS-CoV-2 target nucleic acids are NOT DETECTED.  The SARS-CoV-2 RNA is generally detectable in upper respiratory specimens during the acute phase of infection. The lowest concentration of SARS-CoV-2 viral copies this assay can detect is 138 copies/mL. A negative result does not preclude SARS-Cov-2 infection and should not be used as the sole basis for treatment or other patient management decisions. A negative result may occur with   improper specimen collection/handling, submission of specimen other than nasopharyngeal swab, presence of viral mutation(s) within the areas targeted by this assay, and inadequate number of viral copies(<138 copies/mL). A negative result must be combined with clinical observations, patient history, and epidemiological information. The expected result is Negative.  Fact Sheet for Patients:  EntrepreneurPulse.com.au  Fact Sheet for Healthcare Providers:  IncredibleEmployment.be  This test is no t yet approved or cleared by the [place] FDA and  has been authorized for detection and/or diagnosis of SARS-CoV-2 by FDA under an Emergency Use Authorization (EUA). This EUA will remain  in effect (meaning this test can be used) for the duration of the COVID-19 declaration under Section 564(b)(1) of the Act, 21 U.S.C.section 360bbb-3(b)(1), unless the authorization is terminated  or revoked sooner.       Influenza A by PCR NEGATIVE NEGATIVE Final   Influenza B by PCR NEGATIVE NEGATIVE Final    Comment: (NOTE) The Xpert Xpress SARS-CoV-2/FLU/RSV plus assay is intended as an aid in the diagnosis of influenza from Nasopharyngeal swab specimens and should not be used as a sole basis for treatment. Nasal washings and aspirates are unacceptable for Xpert Xpress SARS-CoV-2/FLU/RSV testing.  Fact Sheet for Patients: EntrepreneurPulse.com.au  Fact Sheet for Healthcare Providers: IncredibleEmployment.be  This test is not yet approved or cleared by the [place] FDA and has been authorized for detection and/or diagnosis of SARS-CoV-2 by FDA under an Emergency Use Authorization (EUA). This EUA will remain in effect (meaning this  test can be used) for the duration of the COVID-19 declaration under Section 564(b)(1) of the Act, 21 U.S.C. section 360bbb-3(b)(1), unless the authorization is terminated  or revoked.  Performed at [facility], [place] [address], [place], [place] [address]   Blood Culture (routine x 2)     Status: None (Preliminary result)   Collection Time: 06/19/21 10:16 PM   Specimen: BLOOD  Result Value Ref Range Status   Specimen Description   Final    BLOOD RIGHT CHEST Performed at [facility] [address], [place], [place] [address]    Special Requests   Final    Blood Culture adequate volume BOTTLES DRAWN AEROBIC AND ANAEROBIC Performed at [facility] [address] [place], [place] [address]    Culture   Final    NO GROWTH 2 DAYS Performed at [facility] [address], [place], [place] [address]    Report Status PENDING  Incomplete  Blood Culture (routine x 2)     Status: None (Preliminary result)   Collection Time: 06/19/21 10:16 PM   Specimen: BLOOD  Result Value Ref Range Status   Specimen Description   Final    BLOOD BLOOD RIGHT WRIST Performed at [facility] [address] [place], [place] [address]    Special Requests   Final    Blood Culture results may not be optimal due to an inadequate volume of blood received in culture bottles BOTTLES DRAWN AEROBIC AND ANAEROBIC Performed at [facility], [place] [address] [place], [place] [address]    Culture   Final    NO GROWTH 2 DAYS Performed at [facility] [address], [place], [place] [address]    Report Status PENDING  Incomplete  Body fluid culture w Gram Stain     Status: None (Preliminary result)   Collection Time: 06/20/21  2:28 PM   Specimen: PATH Cytology Pleural fluid  Result Value Ref Range Status   Specimen Description   Final    PLEURAL Performed at [facility] [address], [place], [place] [address]    Special Requests   Final    NONE Performed at [facility], [place] [address] [place], [place] [address]    Gram Stain    Final    FEW WBC PRESENT, PREDOMINANTLY MONONUCLEAR NO ORGANISMS SEEN    Culture   Final    NO GROWTH < 12 HOURS Performed at [facility] [address], [place], [place] [address]    Report Status PENDING  Incomplete     Radiology Studies: DG Pelvis 1-2 Views  Result Date: 06/19/2021 CLINICAL DATA:  Mechanical fall after syncopal episode. EXAM: PELVIS - 1-2 VIEW COMPARISON:  None. FINDINGS: Residual contrast material in the bladder. Postoperative changes in the lower lumbar spine. Degenerative changes in both hips with narrowed acetabular joint and sclerosis and osteophyte formation. No evidence of acute fracture or dislocation. No focal bone lesion or bone destruction. Bone cortex appears intact. IMPRESSION: Degenerative changes in both hips.  No acute bony abnormalities. Electronically Signed   By: Lucienne Capers M.D.   On: 06/19/2021 20:56   CT HEAD WO CONTRAST  Result Date: 06/19/2021 CLINICAL DATA:  Head trauma EXAM: CT HEAD WITHOUT CONTRAST TECHNIQUE: Contiguous axial images were obtained from the base of the skull through the vertex without intravenous contrast. COMPARISON:  None. FINDINGS: Brain: There is no mass, hemorrhage or  extra-axial collection. There is generalized atrophy without lobar predilection. Hypodensity of the white matter is most commonly associated with chronic microvascular disease. There are calcifications within the corona radiata and basal ganglia Vascular: No abnormal hyperdensity of the major intracranial arteries or dural venous sinuses. No intracranial atherosclerosis. Skull: The visualized skull base, calvarium and extracranial soft tissues are normal. Sinuses/Orbits: No fluid levels or advanced mucosal thickening of the visualized paranasal sinuses. No mastoid or middle ear effusion. The orbits are normal. IMPRESSION: 1. No acute intracranial abnormality. 2. Atrophy and chronic microvascular ischemia. Cerebral Atrophy (ICD10-G31.9). Electronically  Signed   By: Ulyses Jarred M.D.   On: 06/19/2021 20:02   CT CHEST W CONTRAST  Result Date: 06/19/2021 CLINICAL DATA:  Chest trauma, blunt. Syncopal episode with fall. Bilateral flank pain and LEFT shoulder pain. Back pain. EXAM: CT CHEST WITH CONTRAST TECHNIQUE: Multidetector CT imaging of the chest was performed during intravenous contrast administration. CONTRAST:  75mL OMNIPAQUE IOHEXOL 350 MG/ML SOLN COMPARISON:  Chest CT dated 02/27/2021. FINDINGS: Cardiovascular: Thoracic aorta appears intact and stable in configuration. Scattered atherosclerosis of the thoracic aorta and coronary arteries. No significant pericardial effusion. Mediastinum/Nodes: No mass or enlarged lymph nodes are seen within the mediastinum. Esophagus is somewhat patulous, containing recently ingested material, but otherwise unremarkable. Trachea and central bronchi are unremarkable. Lungs/Pleura: Moderate-sized LEFT pleural effusion with overlying compressive atelectasis. Mild atelectasis versus pleural thickening at the RIGHT lung base. No pneumothorax. Upper Abdomen: Hypodense lesions/masses throughout the liver, compatible with previously described hepatic metastases. Scattered areas of free fluid/ascites within the upper abdomen. Musculoskeletal: Degenerative spondylosis of the kyphotic thoracic spine, mild to moderate in degree. Anterior cervical fusion hardware within the lower cervical spine. No acute-appearing osseous abnormality. IMPRESSION: 1. Moderate-sized LEFT pleural effusion with overlying compressive atelectasis, new compared to the earlier chest CT of 02/27/2021. 2. Known hepatic metastases, not significantly changed compared to CT of 02/27/2021. Also similar appearance of the periportal/gastrohepatic ligament adenopathy that was also described on the earlier CT. 3. Scattered free fluid/ascites within the upper abdomen. 4. No acute osseous fracture or dislocation identified. 5. Debris versus recently ingested material  within the upper esophagus. This presents a risk for potential aspiration. Aortic Atherosclerosis (ICD10-I70.0). Electronically Signed   By: Franki Cabot M.D.   On: 06/19/2021 20:33   CT CERVICAL SPINE WO CONTRAST  Result Date: 06/19/2021 CLINICAL DATA:  Neck trauma, EXAM: CT CERVICAL SPINE WITHOUT CONTRAST TECHNIQUE: Multidetector CT imaging of the cervical spine was performed without intravenous contrast. Multiplanar CT image reconstructions were also generated. COMPARISON:  None. FINDINGS: Alignment: Moderate dextroscoliosis of the cervical spine. No evidence of acute vertebral body subluxation. Skull base and vertebrae: No fracture line or displaced fracture fragment is seen. Anterior cervical fusion hardware at C5 through C7 appears intact and appropriately positioned. Soft tissues and spinal canal: No prevertebral fluid or swelling. No visible canal hematoma. Disc levels: Mild degenerative spurring at multiple levels, but no more than mild central canal stenosis at any level. Upper chest: Debris versus recently ingested material within the upper esophagus. Limited images of the upper chest are otherwise unremarkable. Other: Bilateral carotid atherosclerosis. IMPRESSION: 1. No acute findings within the cervical spine. No osseous fracture or dislocation. No evidence of osseous metastasis. 2. Anterior cervical fusion hardware at C5 through C7 appears intact and appropriately positioned. 3. Moderate scoliosis. 4. Bilateral carotid atherosclerosis. 5. Debris versus recently ingested material within the upper esophagus. This presents a potential risk for aspiration. Electronically Signed   By: Cherlynn Kaiser  Enriqueta Shutter M.D.   On: 06/19/2021 20:43   CT T-SPINE NO CHARGE  Result Date: 06/19/2021 CLINICAL DATA:  Chest trauma, back pain. EXAM: CT THORACIC SPINE WITHOUT CONTRAST TECHNIQUE: Multidetector CT images of the thoracic were obtained using the standard protocol without intravenous contrast. COMPARISON:  None.  FINDINGS: Alignment: Moderate to severe kyphosis of the thoracic spine. No evidence of acute vertebral body subluxation. Vertebrae: No fracture line or displaced fracture fragment. No evidence of acute compression fracture deformity.w Paraspinal and other soft tissues: Moderate-sized LEFT pleural effusion with overlying compressive atelectasis, new compared to a CT chest CT of 02/27/2021. Multiple nonobstructing LEFT renal stones, largest measuring 8 mm. Known hepatic metastases, incompletely imaged. Visualized paraspinal soft tissues are otherwise unremarkable. Disc levels: Degenerative spondylosis throughout the thoracolumbar spine, mild to moderate in degree, with associated mild-to-moderate central canal stenoses at multiple levels. IMPRESSION: 1. No acute findings within the thoracic spine. No acute osseous fracture or dislocation within the thoracic spine. No evidence of osseous metastasis within the thoracic spine. 2. Kyphosis and degenerative changes of the thoracic spine, as detailed above. 3. Moderate-sized LEFT pleural effusion with overlying compressive atelectasis, new compared to a recent chest CT of 02/27/2021. 4. Known hepatic metastases, incompletely imaged. 5. Nonobstructing LEFT renal stones. Electronically Signed   By: Franki Cabot M.D.   On: 06/19/2021 20:39   DG CHEST PORT 1 VIEW  Result Date: 06/20/2021 CLINICAL DATA:  S/P thoracentesis - image obtained bedside in U/S. EXAM: PORTABLE CHEST - 1 VIEW COMPARISON:  CT from previous day FINDINGS: No pneumothorax. No significant pleural effusion evident. Residual patchy left infrahilar atelectasis or infiltrate. Right lung clear. For stable right IJ power injectable port catheter. Heart size and mediastinal contours are within normal limits. Cervical fixation hardware partially visualized. IMPRESSION: No pneumothorax post thoracentesis. Electronically Signed   By: Lucrezia Europe M.D.   On: 06/20/2021 14:50   DG Lumbar Spine  2-3Vclearing  Result Date: 06/19/2021 CLINICAL DATA:  Low back pain after fall. EXAM: LIMITED LUMBAR SPINE FOR TRAUMA CLEARING - 2-3 VIEW COMPARISON:  September 13, 2015. FINDINGS: Status post surgical posterior fusion of L3-4 and L4-5 with bilateral intrapedicular screw placement and interbody fusion. No fracture or spondylolisthesis is noted. IMPRESSION: Postsurgical changes as described above.  No acute abnormality seen. Electronically Signed   By: Marijo Conception M.D.   On: 06/19/2021 20:55   US THORACENTESIS ASP PLEURAL SPACE W/IMG GUIDE  Result Date: 06/20/2021 INDICATION: 70 year old male referred for diagnostic thoracentesis EXAM: ULTRASOUND GUIDED LEFT THORACENTESIS MEDICATIONS: None. COMPLICATIONS: None PROCEDURE: An ultrasound guided thoracentesis was thoroughly discussed with the patient and questions answered. The benefits, risks, alternatives and complications were also discussed. The patient understands and wishes to proceed with the procedure. Written consent was obtained. Ultrasound was performed to localize and mark an adequate pocket of fluid in the left chest. The area was then prepped and draped in the normal sterile fashion. 1% Lidocaine was used for local anesthesia. Under ultrasound guidance a 8 Fr Safe-T-Centesis catheter was introduced. Thoracentesis was performed. The catheter was removed and a dressing applied. FINDINGS: A total of approximately 325 cc of thin amber fluid was removed. Samples were sent to the laboratory as requested by the clinical team. IMPRESSION: Status post ultrasound-guided left thoracentesis. Signed, Dulcy Fanny. Dellia Nims, RPVI [facility] Radiology Electronically Signed   By: Corrie Mckusick D.O.   On: 06/20/2021 14:59     LOS: 2 days   Antonieta Pert, MD [facility]  06/21/2021, 10:27 AM

## 2021-06-21 NOTE — Progress Notes (Signed)
Manufacturing engineer [facility]) Hospital Liaison: RN note    Notified by Transition of Care Manger of patient/family request for [facility] services at home after discharge. Chart and patient information under review by [facility] physician. Hospice eligibility pending currently.    Writer spoke with patient's wife to initiate education related to hospice philosophy, services and team approach to care. Wife verbalized understanding of information given. Per discussion, plan is for discharge to home by PTAR.   Please send signed and completed DNR form home with patient/family. Patient will need prescriptions for discharge comfort medications.     DME needs have been discussed, patient currently has the following equipment in the home: walker and W/C.  Patient/family requests the following DME for delivery to the home: 3N1.  [employer] equipment manager has been notified and will contact DME provider to arrange delivery to the home. Home address has been verified and is correct in the chart.  Jeani Hawking is the family member to contact to arrange time of delivery.     [facility] aware of the above. Please notify [facility] when patient is ready to leave the unit at discharge. (Call [phone] or [phone] after 5pm.) [facility] information and contact numbers given to [facility].      A Please do not hesitate to call with questions.    Thank you,   Farrel Gordon, RN, [facility] Liaison   [phone]

## 2021-06-21 NOTE — Progress Notes (Signed)
IP PROGRESS NOTE  Subjective:   Erik Fletcher reports improvement in pain.  Objective: Vital signs in last 24 hours: Blood pressure (!) 134/100, pulse (!) 114, temperature 99.1 F (37.3 C), temperature source Oral, resp. rate 18, SpO2 93 %.  Intake/Output from previous day: 12/28 0701 - 12/29 0700 In: 5361.3 [I.V.:2363.3; IV Piggyback:2998] Out: 1150 [Urine:1150]  Physical Exam:  HEENT: No thrush or ulcers Lungs: Distant breath sounds, no respiratory distress Cardiac: Regular rate and rhythm Abdomen: The liver edge is palpable in the right upper abdomen, no splenomegaly Extremities: Trace edema at the left greater than right lower leg and foot Neurologic: Follows commands, confused  Portacath/PICC-without erythema  Lab Results: Recent Labs    06/20/21 0551 06/21/21 0500  WBC 21.8* 29.1*  HGB 9.9* 10.3*  HCT 29.9* 31.4*  PLT 258 246    BMET Recent Labs    06/20/21 0551 06/21/21 0500  NA 134* 133*  K 3.7 4.0  CL 99 100  CO2 25 22  GLUCOSE 134* 129*  BUN 13 10  CREATININE 0.89 0.87  CALCIUM 8.5* 7.7*    Lab Results  Component Value Date   CEA 54.5 (H) 12/01/2020   PVV748 97,813 (H) 06/06/2021    Studies/Results: DG Pelvis 1-2 Views  Result Date: 06/19/2021 CLINICAL DATA:  Mechanical fall after syncopal episode. EXAM: PELVIS - 1-2 VIEW COMPARISON:  None. FINDINGS: Residual contrast material in the bladder. Postoperative changes in the lower lumbar spine. Degenerative changes in both hips with narrowed acetabular joint and sclerosis and osteophyte formation. No evidence of acute fracture or dislocation. No focal bone lesion or bone destruction. Bone cortex appears intact. IMPRESSION: Degenerative changes in both hips.  No acute bony abnormalities. Electronically Signed   By: Lucienne Capers M.D.   On: 06/19/2021 20:56   CT HEAD WO CONTRAST  Result Date: 06/19/2021 CLINICAL DATA:  Head trauma EXAM: CT HEAD WITHOUT CONTRAST TECHNIQUE: Contiguous axial  images were obtained from the base of the skull through the vertex without intravenous contrast. COMPARISON:  None. FINDINGS: Brain: There is no mass, hemorrhage or extra-axial collection. There is generalized atrophy without lobar predilection. Hypodensity of the white matter is most commonly associated with chronic microvascular disease. There are calcifications within the corona radiata and basal ganglia Vascular: No abnormal hyperdensity of the major intracranial arteries or dural venous sinuses. No intracranial atherosclerosis. Skull: The visualized skull base, calvarium and extracranial soft tissues are normal. Sinuses/Orbits: No fluid levels or advanced mucosal thickening of the visualized paranasal sinuses. No mastoid or middle ear effusion. The orbits are normal. IMPRESSION: 1. No acute intracranial abnormality. 2. Atrophy and chronic microvascular ischemia. Cerebral Atrophy (ICD10-G31.9). Electronically Signed   By: Ulyses Jarred M.D.   On: 06/19/2021 20:02   CT CHEST W CONTRAST  Result Date: 06/19/2021 CLINICAL DATA:  Chest trauma, blunt. Syncopal episode with fall. Bilateral flank pain and LEFT shoulder pain. Back pain. EXAM: CT CHEST WITH CONTRAST TECHNIQUE: Multidetector CT imaging of the chest was performed during intravenous contrast administration. CONTRAST:  64m OMNIPAQUE IOHEXOL 350 MG/ML SOLN COMPARISON:  Chest CT dated 02/27/2021. FINDINGS: Cardiovascular: Thoracic aorta appears intact and stable in configuration. Scattered atherosclerosis of the thoracic aorta and coronary arteries. No significant pericardial effusion. Mediastinum/Nodes: No mass or enlarged lymph nodes are seen within the mediastinum. Esophagus is somewhat patulous, containing recently ingested material, but otherwise unremarkable. Trachea and central bronchi are unremarkable. Lungs/Pleura: Moderate-sized LEFT pleural effusion with overlying compressive atelectasis. Mild atelectasis versus pleural thickening at the RIGHT  lung base. No pneumothorax. Upper Abdomen: Hypodense lesions/masses throughout the liver, compatible with previously described hepatic metastases. Scattered areas of free fluid/ascites within the upper abdomen. Musculoskeletal: Degenerative spondylosis of the kyphotic thoracic spine, mild to moderate in degree. Anterior cervical fusion hardware within the lower cervical spine. No acute-appearing osseous abnormality. IMPRESSION: 1. Moderate-sized LEFT pleural effusion with overlying compressive atelectasis, new compared to the earlier chest CT of 02/27/2021. 2. Known hepatic metastases, not significantly changed compared to CT of 02/27/2021. Also similar appearance of the periportal/gastrohepatic ligament adenopathy that was also described on the earlier CT. 3. Scattered free fluid/ascites within the upper abdomen. 4. No acute osseous fracture or dislocation identified. 5. Debris versus recently ingested material within the upper esophagus. This presents a risk for potential aspiration. Aortic Atherosclerosis (ICD10-I70.0). Electronically Signed   By: Franki Cabot M.D.   On: 06/19/2021 20:33   CT CERVICAL SPINE WO CONTRAST  Result Date: 06/19/2021 CLINICAL DATA:  Neck trauma, EXAM: CT CERVICAL SPINE WITHOUT CONTRAST TECHNIQUE: Multidetector CT imaging of the cervical spine was performed without intravenous contrast. Multiplanar CT image reconstructions were also generated. COMPARISON:  None. FINDINGS: Alignment: Moderate dextroscoliosis of the cervical spine. No evidence of acute vertebral body subluxation. Skull base and vertebrae: No fracture line or displaced fracture fragment is seen. Anterior cervical fusion hardware at C5 through C7 appears intact and appropriately positioned. Soft tissues and spinal canal: No prevertebral fluid or swelling. No visible canal hematoma. Disc levels: Mild degenerative spurring at multiple levels, but no more than mild central canal stenosis at any level. Upper chest: Debris  versus recently ingested material within the upper esophagus. Limited images of the upper chest are otherwise unremarkable. Other: Bilateral carotid atherosclerosis. IMPRESSION: 1. No acute findings within the cervical spine. No osseous fracture or dislocation. No evidence of osseous metastasis. 2. Anterior cervical fusion hardware at C5 through C7 appears intact and appropriately positioned. 3. Moderate scoliosis. 4. Bilateral carotid atherosclerosis. 5. Debris versus recently ingested material within the upper esophagus. This presents a potential risk for aspiration. Electronically Signed   By: Franki Cabot M.D.   On: 06/19/2021 20:43   CT T-SPINE NO CHARGE  Result Date: 06/19/2021 CLINICAL DATA:  Chest trauma, back pain. EXAM: CT THORACIC SPINE WITHOUT CONTRAST TECHNIQUE: Multidetector CT images of the thoracic were obtained using the standard protocol without intravenous contrast. COMPARISON:  None. FINDINGS: Alignment: Moderate to severe kyphosis of the thoracic spine. No evidence of acute vertebral body subluxation. Vertebrae: No fracture line or displaced fracture fragment. No evidence of acute compression fracture deformity.w Paraspinal and other soft tissues: Moderate-sized LEFT pleural effusion with overlying compressive atelectasis, new compared to a CT chest CT of 02/27/2021. Multiple nonobstructing LEFT renal stones, largest measuring 8 mm. Known hepatic metastases, incompletely imaged. Visualized paraspinal soft tissues are otherwise unremarkable. Disc levels: Degenerative spondylosis throughout the thoracolumbar spine, mild to moderate in degree, with associated mild-to-moderate central canal stenoses at multiple levels. IMPRESSION: 1. No acute findings within the thoracic spine. No acute osseous fracture or dislocation within the thoracic spine. No evidence of osseous metastasis within the thoracic spine. 2. Kyphosis and degenerative changes of the thoracic spine, as detailed above. 3.  Moderate-sized LEFT pleural effusion with overlying compressive atelectasis, new compared to a recent chest CT of 02/27/2021. 4. Known hepatic metastases, incompletely imaged. 5. Nonobstructing LEFT renal stones. Electronically Signed   By: Franki Cabot M.D.   On: 06/19/2021 20:39   DG CHEST PORT 1 VIEW  Result Date: 06/20/2021 CLINICAL DATA:  S/P thoracentesis - image obtained bedside in U/S. EXAM: PORTABLE CHEST - 1 VIEW COMPARISON:  CT from previous day FINDINGS: No pneumothorax. No significant pleural effusion evident. Residual patchy left infrahilar atelectasis or infiltrate. Right lung clear. For stable right IJ power injectable port catheter. Heart size and mediastinal contours are within normal limits. Cervical fixation hardware partially visualized. IMPRESSION: No pneumothorax post thoracentesis. Electronically Signed   By: Lucrezia Europe M.D.   On: 06/20/2021 14:50   DG Lumbar Spine 2-3Vclearing  Result Date: 06/19/2021 CLINICAL DATA:  Low back pain after fall. EXAM: LIMITED LUMBAR SPINE FOR TRAUMA CLEARING - 2-3 VIEW COMPARISON:  September 13, 2015. FINDINGS: Status post surgical posterior fusion of L3-4 and L4-5 with bilateral intrapedicular screw placement and interbody fusion. No fracture or spondylolisthesis is noted. IMPRESSION: Postsurgical changes as described above.  No acute abnormality seen. Electronically Signed   By: Marijo Conception M.D.   On: 06/19/2021 20:55   US THORACENTESIS ASP PLEURAL SPACE W/IMG GUIDE  Result Date: 06/20/2021 INDICATION: 70 year old male referred for diagnostic thoracentesis EXAM: ULTRASOUND GUIDED LEFT THORACENTESIS MEDICATIONS: None. COMPLICATIONS: None PROCEDURE: An ultrasound guided thoracentesis was thoroughly discussed with the patient and questions answered. The benefits, risks, alternatives and complications were also discussed. The patient understands and wishes to proceed with the procedure. Written consent was obtained. Ultrasound was performed to  localize and mark an adequate pocket of fluid in the left chest. The area was then prepped and draped in the normal sterile fashion. 1% Lidocaine was used for local anesthesia. Under ultrasound guidance a 8 Fr Safe-T-Centesis catheter was introduced. Thoracentesis was performed. The catheter was removed and a dressing applied. FINDINGS: A total of approximately 325 cc of thin amber fluid was removed. Samples were sent to the laboratory as requested by the clinical team. IMPRESSION: Status post ultrasound-guided left thoracentesis. Signed, Dulcy Fanny. Dellia Nims, RPVI [facility] Interventional Radiology Specialists [facility] Radiology Electronically Signed   By: Corrie Mckusick D.O.   On: 06/20/2021 14:59    Medications: I have reviewed the patient's current medications.  Assessment/Plan: Intrahepatic cholangiocarcinoma, clinical stage IIIb (T2N1M0) Abdominal ultrasound 11/24/2020-by lobar hepatic lesions including a large infiltrative right liver lesion and heterogenous mass superior to the pancreas head MRI abdomen 11/29/2020-numerous hypoenhancing by lobar liver lesions suggestive of cholangiocarcinoma, multiple abnormal portacaval and celiac axis/gastropathic ligament nodes consistent with metastatic disease Ultrasound-guided biopsy of a right liver lesion 12/11/2020-adenocarcinoma, immunohistochemical profile and morphology consistent with a pancreaticobiliary primary MSS, tumor mutation burden-1, no FGFR2 alteration Markedly elevated CA 19-9 CT 12/20/2020-numerous right and left liver lesions, numerous portacaval, celiac, and gastropathic ligament nodes, no evidence of metastatic disease in the chest Cycle 1 gemcitabine/cisplatin/Durvalumab 12/28/2020 Cycle 2 gemcitabine/cisplatin/Durvalumab 01/19/2021 Cycle 3 gemcitabine/cisplatin/Durvalumab 02/09/2021, day 8 held due to hypotension/dehydration CTs 02/27/2021-new and enlarging hepatic metastatic disease and enlarging periportal/gastrohepatic ligament  adenopathy. Cycle 1 FOLFOX 03/06/2021 Cycle 2 FOLFOX 03/21/2021 Cycle 3 FOLFOX 05/24/2021 Glaucoma Anorexia/weight loss secondary to #1 Pain secondary to #1 Port-A-Cath placement 12/27/2020 TSH markedly elevated 05/04/2021-Euthyrox increased from 50 mcg to 100 mcg Severe neutropenia following cycle 3 FOLFOX, chemotherapy held 06/06/2021, Udenyca to be added with cycle 4 Admission 06/19/2021 following a fall New left effusion on chest CT 06/19/2021 Altered mental status    Erik Fletcher remains confused this morning.  I suspect the altered mental status is related to delirium from critical illness and hospitalization.  He could have an infection, but no source for infection has been identified to date.  Cultures remain negative.  Mr. Garringer has a poor prognosis.  His performance status has declined over the past several months.  I discussed case with his wife yesterday.  She will agree to home hospice care.  She feels she and other family members can care for him in the home.  Recommendations: 1.  Continue antibiotics, follow-up cultures 2.  Follow-up left pleural effusion cytology 3.  Consider increasing the thyroid hormone replacement dose due to the elevated TSH, dose was increased in November 4.  Please call oncology as needed, I will be out until 06/24/2020.  I will check on him 06/24/2020  I will call his wife today.   LOS: 2 days   Betsy Coder, MD   06/21/2021, 8:46 AM

## 2021-06-21 NOTE — TOC Initial Note (Signed)
Transition of Care [facility]) - Initial/Assessment Note    Patient Details  Name: Erik Fletcher. MRN: [id number] Date of Birth: [birth year]  [facility] CM/SW Contact:    Sarim Rothman, Marjie Skiff, RN Phone Number: 06/21/2021, 2:10 PM  Clinical Narrative:                 Spoke with pt wife for DC planning. TOC consult for home hospice. Choice offered to wife and [facility] chosen. Catherine contacted for referral. Per wife only DME needed is [facility]. Shanita informed. TOC will continue to follow along and assist with dc as needed.  Expected Discharge Plan: Home w Hospice Care Barriers to Discharge: Continued Medical Work up   Patient Goals and CMS Choice   CMS Medicare.gov Compare Post Acute Care list provided to:: Patient Represenative (must comment) (Wife) Choice offered to / list presented to : Spouse  Expected Discharge Plan and Services Expected Discharge Plan: Home w Hospice Care   Discharge Planning Services: CM Consult Post Acute Care Choice: Hospice Living arrangements for the past 2 months: Single Family Home                 DME Arranged: 3-N-1         [place] Arranged: Disease Management [place] Agency: Hospice and [place] Date [place]: 06/21/21 Time HH Agency Contacted: [id number] Representative spoke with at [place]: [place] Arrangements/Services Living arrangements for the past 2 months: [place] with:: Spouse Patient language and need for interpreter reviewed:: Yes          Care giver support system in place?: Yes (comment)   Criminal Activity/Legal Involvement Pertinent to Current Situation/Hospitalization: No - Comment as needed  Activities of Daily Living Home Assistive Devices/Equipment: None ADL Screening (condition at time of admission) Patient's cognitive ability adequate to safely complete daily activities?: Yes Is the patient deaf or have difficulty hearing?:  No Does the patient have difficulty seeing, even when wearing glasses/contacts?: No Does the patient have difficulty concentrating, remembering, or making decisions?: No Patient able to express need for assistance with ADLs?: Yes Does the patient have difficulty dressing or bathing?: No Independently performs ADLs?: Yes (appropriate for developmental age) Does the patient have difficulty walking or climbing stairs?: No Weakness of Legs: Both Weakness of Arms/Hands: None  Permission Sought/Granted Permission sought to share information with : Facility Art therapist granted to share information with : Yes, Verbal Permission Granted     Permission granted to share info w AGENCY: [facility]        Emotional Assessment         Alcohol / Substance Use: Not Applicable Psych Involvement: No (comment)  Admission diagnosis:  Pleural effusion [J90] Fall [W19.XXXA] Patient Active Problem List   Diagnosis Date Noted   Syncope and collapse 06/20/2021   Cellulitis, anaerobic ([place]) 06/20/2021   Bacterial conjunctivitis 06/20/2021   Lactic acidosis 06/20/2021   Pleural effusion 06/19/2021   Cholangiocarcinoma of liver ([place]) 12/15/2020   Goals of care, counseling/discussion 12/15/2020   Lumbar disc disease with radiculopathy 06/27/2015   PCP:  Lawerance Cruel, MD Pharmacy:   [facility], [address] [place] [address] Phone: [phone] Fax: [phone]     Social Determinants of Health (SDOH) Interventions    Readmission Risk Interventions No flowsheet data found.

## 2021-06-22 LAB — CBC
HCT: 32.1 % — ABNORMAL LOW (ref 39.0–52.0)
Hemoglobin: 10.8 g/dL — ABNORMAL LOW (ref 13.0–17.0)
MCH: 33.9 pg (ref 26.0–34.0)
MCHC: 33.6 g/dL (ref 30.0–36.0)
MCV: 100.6 fL — ABNORMAL HIGH (ref 80.0–100.0)
Platelets: 261 10*3/uL (ref 150–400)
RBC: 3.19 MIL/uL — ABNORMAL LOW (ref 4.22–5.81)
RDW: 15.9 % — ABNORMAL HIGH (ref 11.5–15.5)
WBC: 27.4 10*3/uL — ABNORMAL HIGH (ref 4.0–10.5)
nRBC: 0 % (ref 0.0–0.2)

## 2021-06-22 LAB — CYTOLOGY - NON PAP

## 2021-06-22 LAB — BASIC METABOLIC PANEL
Anion gap: 9 (ref 5–15)
BUN: 12 mg/dL (ref 8–23)
CO2: 24 mmol/L (ref 22–32)
Calcium: 7.7 mg/dL — ABNORMAL LOW (ref 8.9–10.3)
Chloride: 99 mmol/L (ref 98–111)
Creatinine, Ser: 0.89 mg/dL (ref 0.61–1.24)
GFR, Estimated: >60 mL/min (ref >60)
Glucose, Bld: 108 mg/dL — ABNORMAL HIGH (ref 70–99)
Potassium: 3.5 mmol/L (ref 3.5–5.1)
Sodium: 132 mmol/L — ABNORMAL LOW (ref 135–145)

## 2021-06-22 NOTE — Progress Notes (Signed)
PROGRESS NOTE    Erik Fletcher.  PJK:[id number] DOB: 24-Oct-1950 DOA: 06/19/2021 PCP: Lawerance Cruel, MD   Chief Complaint  Patient presents with   Fall  Brief Narrative/Hospital Course: Erik Fletcher., 70 y.o. 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  On 12/26 he was walking to the bathroom and suddenly had loss of consciousness and fell backwards and was having back pain.  Has been having syncopal episodes with falls several times a month seen by oncology felt to be orthostatic hypotension and was started on midodrine in November and also had his cortisol checked on 11/11 that was up at 28.  Patient denies any chest pain palpitation shortness of breath nausea vomiting.  In the ED hemodynamically stable. WBC of 21K, hemoglobin of 10,lactate of 4. Sodium of 134, K of 3.9, creatinine was 0.85, BG of 93 CT head and cervical spine was negative for acute findings. CT chest shows moderate-sized left pleural effusion with compressive atelectasis.  There is also debris versus ingested material within the upper esophagus concerning for aspiration. Lumbar spine and pelvis was negative.   Subjective:  Seen this morning appears more coherent alert awake not in distress  No acute events overnight   Assessment & Plan:  Recurrent syncope with loss of consciousness and falls: Cortisol level was up, at 28 In November-felt to be orthostatic and was placed on midodrine in November by his oncology.  Increased to 5 mg 3 times daily, given multiple fluid boluses.  Echocardiogram shows EF 55 to 60%, G1 DD moderate pleural effusion the left lateral region   Metastatic cholangiocarcinoma on chemotherapy by Dr. Learta Codding.  CT chest showed hypodense lesions/masses throughout the liver consistent with hepatic metastasis scattered areas of free fluid/ascites in the upper  abdomen.Discussed with Dr. Learta Codding, he is recommending home with hospice and is being arranged.  Discussed with patient's wife yesterday changed to DNR and patient is agreeable order discussed during this morning.    Acute metabolic encephalopathy/hospital-acquired delirium in the setting of his metastatic cancer, hospitalization.  Continue supportive care fall precaution.  Left-sided pleural effusion seen on the CT chest:In the setting of immunocompromise status placed on IV Unasyn/cefepime.S/p US thoracentesis  325 ml fluid removed-post procedure chest x-ray with no pneumothorax and no significant pleural effusion 12/28,  pleural fluid gram stain no organism, cytology still pending.total pleural protein 3.2 , serum protein 6.2.  LDH elevated 279 .  Overall asymptomatic not hypoxic.  Mild left hand cellulitis patient is now on Unasyn .he had dog scratch.  Received tetanus booster in the ED  Lactic acidosis: Unclear etiology could be multifactorial was on metformin at home now here with pleural effusion, has  metastatic cholangiocarcinoma,-causing delay in clearance-  was discussed with PCCM, could be due to high tumor burden, patient was given multiple boluses, remains hemodynamically stable, afebrile.  Amylase lipase normal troponin negative- no evidence of MI no evidence of sepsis so far.  VBG with pH 7.4, lab with bicarb normal at 24 Recent Labs  Lab 06/20/21 1154 06/20/21 1525 06/21/21 0500  LATICACIDVEN 4.1* 5.3* 6.3*     Right bacterial conjunctivitis on erythromycin ointment 3 times daily  Leukocytosis:Count was WBC 1K on 12/14 on admission 21.2K and uptrended to 29k and now downtrending continue to monitor CBC keep on antibiotics for now. Recent Labs  Lab 06/19/21 2005 06/20/21 0551 06/21/21 0500 06/22/21 0605  WBC 21.2* 21.8* 29.1*  27.4*    Goals of care: Discussed with patient, discussed with oncology, oncology recommending home with hospice he has discussed this with his wife  and the patient and both of them are agreeable.  Pleural fluid cytology pending.  Patient is now DNR.   Overall prognosis poor to guarded DVT prophylaxis: enoxaparin (LOVENOX) injection 40 mg Start: 06/21/21 1000 SCDs Start: 06/20/21 0008 Code Status:   Code Status: DNR Family Communication: plan of care discussed with patient at bedside and with his wife on the phone 12/29 Status is: Inpatient Remains inpatient appropriate because: Ongoing management of pleural effusion orthostatic hypotension leukocytosis lactic acidosis Disposition: Currently not medically stable for discharge. Anticipated Disposition: Home with hospice over the weekend  Objective: Vitals last 24 hrs: Vitals:   06/21/21 1227 06/21/21 1622 06/21/21 2052 06/22/21 0453  BP: (!) 144/77 128/90 121/80 102/70  Pulse: 74 75 80 (!) 110  Resp: 18 18 18 16   Temp: 98 F (36.7 C) 97.7 F (36.5 C) 98.2 F (36.8 C) 97.6 F (36.4 C)  TempSrc: Oral Oral Oral Oral  SpO2: 93% 95% 93% 91%   Weight change:   Intake/Output Summary (Last 24 hours) at 06/22/2021 1129 Last data filed at 06/22/2021 0500 Gross per 24 hour  Intake 1097.64 ml  Output 650 ml  Net 447.64 ml    Net IO Since Admission: 4,608.98 mL [06/22/21 1129]   Physical Examination: General exam: AAO to place, people, thinks it is January, HEENT:Oral mucosa moist, Ear/Nose WNL grossly, dentition normal. Respiratory system: bilaterally clear, no use of accessory muscle Cardiovascular system: S1 & S2 +, No JVD,. Gastrointestinal system: Abdomen soft,NT,ND, BS+ Nervous System:Alert, awake, moving extremities and grossly nonfocal Extremities, milddistal peripheral pulses palpable.  Skin: No rashes,no icterus. MSK: Normal muscle bulk,tone, power   Medications reviewed:  Scheduled Meds:  brimonidine  1 drop Right Eye BID   And   timolol  1 drop Right Eye BID   Chlorhexidine Gluconate Cloth  6 each Topical Daily   cycloSPORINE  1 drop Both Eyes BID    enoxaparin (LOVENOX) injection  40 mg Subcutaneous Q24H   erythromycin   Both Eyes Once   erythromycin   Right Eye Q6H   latanoprost  1 drop Right Eye QHS   levothyroxine  100 mcg Oral q morning   midodrine  5 mg Oral TID WC   multivitamin with minerals  1 tablet Oral Daily   Continuous Infusions:  ampicillin-sulbactam (UNASYN) IV 3 g (06/22/21 0924)   ceFEPime (MAXIPIME) IV     lactated ringers 125 mL/hr at 06/22/21 1017    Diet Order             Diet regular Room service appropriate? Yes; Fluid consistency: Thin  Diet effective now                  Weight change:   Wt Readings from Last 3 Encounters:  06/06/21 78.9 kg  05/24/21 79.7 kg  05/11/21 77.6 kg   Consultants:see note  Procedures:see note Antimicrobials: Anti-infectives (From admission, onward)    Start     Dose/Rate Route Frequency Ordered Stop   06/20/21 0200  Ampicillin-Sulbactam (UNASYN) 3 g in sodium chloride 0.9 % 100 mL IVPB        3 g 200 mL/hr over 30 Minutes Intravenous Every 6 hours 06/20/21 0022 06/25/21 0159   06/19/21 2200  vancomycin (VANCOREADY) IVPB 1750 mg/350 mL        1,750 mg 175 mL/hr over 120 Minutes  Intravenous  Once 06/19/21 2127 06/20/21 0856   06/19/21 2130  vancomycin (VANCOCIN) IVPB 1000 mg/200 mL premix  Status:  Discontinued        1,000 mg 200 mL/hr over 60 Minutes Intravenous  Once 06/19/21 2119 06/19/21 2125   06/19/21 2130  ceFEPIme (MAXIPIME) 2 g in sodium chloride 0.9 % 100 mL IVPB        2 g 200 mL/hr over 30 Minutes Intravenous  Once 06/19/21 2119        Culture/Microbiology    Component Value Date/Time   SDES  06/20/2021 1428    PLEURAL Performed at [facility], [place] [address] [place], [place] [address]    SPECREQUEST  06/20/2021 1428    NONE Performed at [facility], [place] [address], [place], [place] [address]    CULT  06/20/2021 1428    NO GROWTH 2 DAYS Performed at [place] [address],  [place], [place] [address]    REPTSTATUS PENDING 06/20/2021 1428    Other culture-see note  Unresulted Labs (From admission, onward)     Start     Ordered   06/21/21 5072  Basic metabolic panel  Daily,   R     Question:  Specimen collection method  Answer:  Lab=Lab collect   06/20/21 1153   06/21/21 0500  CBC  Daily,   R     Question:  Specimen collection method  Answer:  Lab=Lab collect   06/20/21 1153   06/20/21 0108  Legionella Pneumophila Serogp 1 Ur Ag  Once,   R        06/20/21 0107   06/20/21 0107  Strep pneumoniae urinary antigen  Once,   R        06/20/21 0107   06/19/21 2109  Urine Culture  (Septic presentation on arrival (screening labs, nursing and treatment orders for obvious sepsis))  ONCE - STAT,   STAT       Question:  Indication  Answer:  Altered mental status (if no other cause identified)   06/19/21 2119   06/19/21 1911  Urinalysis, Routine w reflex microscopic  (Trauma Panel)  Once,   STAT        06/19/21 1913           Data Reviewed: I have personally reviewed following labs and imaging studies CBC: Recent Labs  Lab 06/19/21 2005 06/20/21 0551 06/21/21 0500 06/22/21 0605  WBC 21.2* 21.8* 29.1* 27.4*  NEUTROABS 17.8*  --   --   --   HGB 10.0* 9.9* 10.3* 10.8*  HCT 30.0* 29.9* 31.4* 32.1*  MCV 100.0 101.4* 102.3* 100.6*  PLT 279 258 246 257    Basic Metabolic Panel: Recent Labs  Lab 06/19/21 2005 06/20/21 0551 06/21/21 0500 06/22/21 0605  NA 134* 134* 133* 132*  K 3.9 3.7 4.0 3.5  CL 98 99 100 99  CO2 24 25 22 24   GLUCOSE 93 134* 129* 108*  BUN 14 13 10 12   CREATININE 0.85 0.89 0.87 0.89  CALCIUM 8.5* 8.5* 7.7* 7.7*  MG 1.8  --   --   --     GFR: CrCl cannot be calculated (Unknown ideal weight.). Liver Function Tests: Recent Labs  Lab 06/19/21 2005  AST 46*  ALT 21  ALKPHOS 440*  BILITOT 1.0  PROT 6.1*  ALBUMIN 2.5*    Recent Labs  Lab 06/20/21 1637  LIPASE 18  AMYLASE 84    No results for input(s): AMMONIA  in the last  168 hours. Coagulation Profile: Recent Labs  Lab 06/19/21 2005  INR 1.1    Cardiac Enzymes: Recent Labs  Lab 06/19/21 2005  CKTOTAL 59    BNP (last 3 results) No results for input(s): PROBNP in the last 8760 hours. HbA1C: No results for input(s): HGBA1C in the last 72 hours. CBG: Recent Labs  Lab 06/21/21 0808  GLUCAP 117*    Lipid Profile: No results for input(s): CHOL, HDL, LDLCALC, TRIG, CHOLHDL, LDLDIRECT in the last 72 hours. Thyroid Function Tests: Recent Labs    06/20/21 0551 06/20/21 1525  TSH 39.992*  --   FREET4  --  0.60*  T3FREE  --  0.6*    Anemia Panel: No results for input(s): VITAMINB12, FOLATE, FERRITIN, TIBC, IRON, RETICCTPCT in the last 72 hours. Sepsis Labs: Recent Labs  Lab 06/20/21 4656 06/20/21 1154 06/20/21 1525 06/21/21 0500  LATICACIDVEN 4.6* 4.1* 5.3* 6.3*     Recent Results (from the past 240 hour(s))  Resp Panel by RT-PCR (Flu A&B, Covid) Nasopharyngeal Swab     Status: None   Collection Time: 06/19/21  8:05 PM   Specimen: Nasopharyngeal Swab; Nasopharyngeal(NP) swabs in vial transport medium  Result Value Ref Range Status   SARS Coronavirus 2 by RT PCR NEGATIVE NEGATIVE Final    Comment: (NOTE) SARS-CoV-2 target nucleic acids are NOT DETECTED.  The SARS-CoV-2 RNA is generally detectable in upper respiratory specimens during the acute phase of infection. The lowest concentration of SARS-CoV-2 viral copies this assay can detect is 138 copies/mL. A negative result does not preclude SARS-Cov-2 infection and should not be used as the sole basis for treatment or other patient management decisions. A negative result may occur with  improper specimen collection/handling, submission of specimen other than nasopharyngeal swab, presence of viral mutation(s) within the areas targeted by this assay, and inadequate number of viral copies(<138 copies/mL). A negative result must be combined with clinical observations, patient  history, and epidemiological information. The expected result is Negative.  Fact Sheet for Patients:  EntrepreneurPulse.com.au  Fact Sheet for Healthcare Providers:  IncredibleEmployment.be  This test is no t yet approved or cleared by the [place] FDA and  has been authorized for detection and/or diagnosis of SARS-CoV-2 by FDA under an Emergency Use Authorization (EUA). This EUA will remain  in effect (meaning this test can be used) for the duration of the COVID-19 declaration under Section 564(b)(1) of the Act, 21 U.S.C.section 360bbb-3(b)(1), unless the authorization is terminated  or revoked sooner.       Influenza A by PCR NEGATIVE NEGATIVE Final   Influenza B by PCR NEGATIVE NEGATIVE Final    Comment: (NOTE) The Xpert Xpress SARS-CoV-2/FLU/RSV plus assay is intended as an aid in the diagnosis of influenza from Nasopharyngeal swab specimens and should not be used as a sole basis for treatment. Nasal washings and aspirates are unacceptable for Xpert Xpress SARS-CoV-2/FLU/RSV testing.  Fact Sheet for Patients: EntrepreneurPulse.com.au  Fact Sheet for Healthcare Providers: IncredibleEmployment.be  This test is not yet approved or cleared by the [place] FDA and has been authorized for detection and/or diagnosis of SARS-CoV-2 by FDA under an Emergency Use Authorization (EUA). This EUA will remain in effect (meaning this test can be used) for the duration of the COVID-19 declaration under Section 564(b)(1) of the Act, 21 U.S.C. section 360bbb-3(b)(1), unless the authorization is terminated or revoked.  Performed at [facility], [place] [address], [place], [place] [address]   Blood Culture (routine  x 2)     Status: None (Preliminary result)   Collection Time: 06/19/21 10:16 PM   Specimen: BLOOD  Result Value Ref Range Status   Specimen Description   Final    BLOOD RIGHT  CHEST Performed at [place] [address], [place], [place] [address]    Special Requests   Final    Blood Culture adequate volume BOTTLES DRAWN AEROBIC AND ANAEROBIC Performed at [place] [address], [place], [place] [address]    Culture   Final    NO GROWTH 3 DAYS Performed at [facility], [place] [address] [place], [place] [address]    Report Status PENDING  Incomplete  Blood Culture (routine x 2)     Status: None (Preliminary result)   Collection Time: 06/19/21 10:16 PM   Specimen: BLOOD  Result Value Ref Range Status   Specimen Description   Final    BLOOD BLOOD RIGHT WRIST Performed at [place] [address], [place], [place] [address]    Special Requests   Final    Blood Culture results may not be optimal due to an inadequate volume of blood received in culture bottles BOTTLES DRAWN AEROBIC AND ANAEROBIC Performed at [facility], [place] [address], [place], [place] [address]    Culture   Final    NO GROWTH 3 DAYS Performed at [facility], [place] [address] [place], [place] [address]    Report Status PENDING  Incomplete  Body fluid culture w Gram Stain     Status: None (Preliminary result)   Collection Time: 06/20/21  2:28 PM   Specimen: PATH Cytology Pleural fluid  Result Value Ref Range Status   Specimen Description   Final    PLEURAL Performed at [place] [address] [place], [place] [address]    Special Requests   Final    NONE Performed at [facility], [place] [address], [place], [place] [address]    Gram Stain   Final    FEW WBC PRESENT, PREDOMINANTLY MONONUCLEAR NO ORGANISMS SEEN    Culture   Final    NO GROWTH 2 DAYS Performed at [place] [address], [place], [place] [address]    Report Status PENDING  Incomplete      Radiology Studies: DG CHEST PORT 1 VIEW  Result Date:  06/20/2021 CLINICAL DATA:  S/P thoracentesis - image obtained bedside in U/S. EXAM: PORTABLE CHEST - 1 VIEW COMPARISON:  CT from previous day FINDINGS: No pneumothorax. No significant pleural effusion evident. Residual patchy left infrahilar atelectasis or infiltrate. Right lung clear. For stable right IJ power injectable port catheter. Heart size and mediastinal contours are within normal limits. Cervical fixation hardware partially visualized. IMPRESSION: No pneumothorax post thoracentesis. Electronically Signed   By: Lucrezia Europe M.D.   On: 06/20/2021 14:50   ECHOCARDIOGRAM COMPLETE  Result Date: 06/21/2021    ECHOCARDIOGRAM REPORT   Patient Name:   Erik Fletcher. Date of Exam: 06/21/2021 Medical Rec #:  [id number]               Height:       68.0 in Accession #:    [id number]              Weight:       174.0 lb Date of Birth:  1950/07/22  BSA:          1.926 m Patient Age:    70 years                BP:           129/88 mmHg Patient Gender: M                       HR:           79 bpm. Exam Location:  Inpatient Procedure: 2D Echo, Cardiac Doppler and Color Doppler Indications:    R55 Syncope  History:        Patient has no prior history of Echocardiogram examinations.  Sonographer:    Glo Herring Referring Phys: [id number] [place]  1. Left ventricular ejection fraction, by estimation, is 55 to 60%. The left ventricle has normal function. The left ventricle has no regional wall motion abnormalities. Left ventricular diastolic parameters are consistent with Grade I diastolic dysfunction (impaired relaxation).  2. Right ventricular systolic function is normal. The right ventricular size is normal. There is normal pulmonary artery systolic pressure.  3. Moderate pleural effusion in the left lateral region.  4. The mitral valve is normal in structure. Trivial mitral valve regurgitation. No evidence of mitral stenosis.  5. The aortic valve is tricuspid. Aortic valve  regurgitation is not visualized. No aortic stenosis is present.  6. Aortic dilatation noted. There is mild dilatation of the aortic root, measuring 39 mm.  7. The inferior vena cava is normal in size with greater than 50% respiratory variability, suggesting right atrial pressure of 3 mmHg. FINDINGS  Left Ventricle: Left ventricular ejection fraction, by estimation, is 55 to 60%. The left ventricle has normal function. The left ventricle has no regional wall motion abnormalities. The left ventricular internal cavity size was normal in size. There is  no left ventricular hypertrophy. Left ventricular diastolic parameters are consistent with Grade I diastolic dysfunction (impaired relaxation). Right Ventricle: The right ventricular size is normal. No increase in right ventricular wall thickness. Right ventricular systolic function is normal. There is normal pulmonary artery systolic pressure. The tricuspid regurgitant velocity is 1.72 m/s, and  with an assumed right atrial pressure of 3 mmHg, the estimated right ventricular systolic pressure is 76.8 mmHg. Left Atrium: Left atrial size was normal in size. Right Atrium: Right atrial size was normal in size. Pericardium: Trivial pericardial effusion is present. Mitral Valve: The mitral valve is normal in structure. Trivial mitral valve regurgitation. No evidence of mitral valve stenosis. Tricuspid Valve: The tricuspid valve is normal in structure. Tricuspid valve regurgitation is trivial. No evidence of tricuspid stenosis. Aortic Valve: The aortic valve is tricuspid. Aortic valve regurgitation is not visualized. No aortic stenosis is present. Aortic valve mean gradient measures 2.0 mmHg. Aortic valve peak gradient measures 3.4 mmHg. Aortic valve area, by VTI measures 2.65 cm. Pulmonic Valve: The pulmonic valve was normal in structure. Pulmonic valve regurgitation is trivial. No evidence of pulmonic stenosis. Aorta: Aortic dilatation noted. There is mild dilatation of the  aortic root, measuring 39 mm. Venous: The inferior vena cava is normal in size with greater than 50% respiratory variability, suggesting right atrial pressure of 3 mmHg. IAS/Shunts: No atrial level shunt detected by color flow Doppler. Additional Comments: There is a moderate pleural effusion in the left lateral region.  LEFT VENTRICLE PLAX 2D LVOT diam:     1.90 cm     Diastology LV SV:  50          LV e' medial:    7.07 cm/s LV SV Index:   26          LV E/e' medial:  6.7 LVOT Area:     2.84 cm    LV e' lateral:   6.96 cm/s                            LV E/e' lateral: 6.8  LV Volumes (MOD) LV vol d, MOD A2C: 35.3 ml LV vol d, MOD A4C: 56.8 ml LV vol s, MOD A2C: 15.2 ml LV vol s, MOD A4C: 25.2 ml LV SV MOD A2C:     20.1 ml LV SV MOD A4C:     56.8 ml LV SV MOD BP:      25.6 ml RIGHT VENTRICLE             IVC RV Basal diam:  4.30 cm     IVC diam: 1.40 cm RV S prime:     13.70 cm/s LEFT ATRIUM             Index        RIGHT ATRIUM           Index LA Vol (A2C):   31.0 ml 16.09 ml/m  RA Area:     11.30 cm LA Vol (A4C):   52.8 ml 27.41 ml/m  RA Volume:   21.10 ml  10.95 ml/m LA Biplane Vol: 44.5 ml 23.10 ml/m  AORTIC VALVE                    PULMONIC VALVE AV Area (Vmax):    2.61 cm     PV Vmax:       0.94 m/s AV Area (Vmean):   2.79 cm     PV Peak grad:  3.6 mmHg AV Area (VTI):     2.65 cm AV Vmax:           92.10 cm/s AV Vmean:          65.700 cm/s AV VTI:            0.188 m AV Peak Grad:      3.4 mmHg AV Mean Grad:      2.0 mmHg LVOT Vmax:         84.90 cm/s LVOT Vmean:        64.700 cm/s LVOT VTI:          0.176 m LVOT/AV VTI ratio: 0.94  AORTA Ao Root diam: 3.90 cm Ao Asc diam:  2.80 cm MITRAL VALVE               TRICUSPID VALVE MV Area (PHT): 4.57 cm    TR Peak grad:   11.8 mmHg MV Decel Time: 166 msec    TR Vmax:        172.00 cm/s MV E velocity: 47.60 cm/s MV A velocity: 66.40 cm/s  SHUNTS MV E/A ratio:  0.72        Systemic VTI:  0.18 m                            Systemic Diam: 1.90 cm Skeet Latch MD Electronically signed by Skeet Latch MD Signature Date/Time: 06/21/2021/5:30:05 PM    Final    US THORACENTESIS ASP PLEURAL SPACE W/IMG GUIDE  Result Date: 06/20/2021 INDICATION: 70 year old  male referred for diagnostic thoracentesis EXAM: ULTRASOUND GUIDED LEFT THORACENTESIS MEDICATIONS: None. COMPLICATIONS: None PROCEDURE: An ultrasound guided thoracentesis was thoroughly discussed with the patient and questions answered. The benefits, risks, alternatives and complications were also discussed. The patient understands and wishes to proceed with the procedure. Written consent was obtained. Ultrasound was performed to localize and mark an adequate pocket of fluid in the left chest. The area was then prepped and draped in the normal sterile fashion. 1% Lidocaine was used for local anesthesia. Under ultrasound guidance a 8 Fr Safe-T-Centesis catheter was introduced. Thoracentesis was performed. The catheter was removed and a dressing applied. FINDINGS: A total of approximately 325 cc of thin amber fluid was removed. Samples were sent to the laboratory as requested by the clinical team. IMPRESSION: Status post ultrasound-guided left thoracentesis. Signed, Dulcy Fanny. Dellia Nims, RPVI [facility] Radiology Electronically Signed   By: Corrie Mckusick D.O.   On: 06/20/2021 14:59     LOS: 3 days   Antonieta Pert, MD [facility]  06/22/2021, 11:29 AM

## 2021-06-23 LAB — BASIC METABOLIC PANEL
Anion gap: 9 (ref 5–15)
BUN: 13 mg/dL (ref 8–23)
CO2: 25 mmol/L (ref 22–32)
Calcium: 7.7 mg/dL — ABNORMAL LOW (ref 8.9–10.3)
Chloride: 98 mmol/L (ref 98–111)
Creatinine, Ser: 0.95 mg/dL (ref 0.61–1.24)
GFR, Estimated: >60 mL/min (ref >60)
Glucose, Bld: 111 mg/dL — ABNORMAL HIGH (ref 70–99)
Potassium: 3.3 mmol/L — ABNORMAL LOW (ref 3.5–5.1)
Sodium: 132 mmol/L — ABNORMAL LOW (ref 135–145)

## 2021-06-23 LAB — CBC
HCT: 31.3 % — ABNORMAL LOW (ref 39.0–52.0)
Hemoglobin: 10.5 g/dL — ABNORMAL LOW (ref 13.0–17.0)
MCH: 33.8 pg (ref 26.0–34.0)
MCHC: 33.5 g/dL (ref 30.0–36.0)
MCV: 100.6 fL — ABNORMAL HIGH (ref 80.0–100.0)
Platelets: 260 10*3/uL (ref 150–400)
RBC: 3.11 MIL/uL — ABNORMAL LOW (ref 4.22–5.81)
RDW: 15.9 % — ABNORMAL HIGH (ref 11.5–15.5)
WBC: 28.4 10*3/uL — ABNORMAL HIGH (ref 4.0–10.5)
nRBC: 0 % (ref 0.0–0.2)

## 2021-06-23 LAB — LACTIC ACID, PLASMA: Lactic Acid, Venous: 3.7 mmol/L (ref 0.5–1.9)

## 2021-06-23 NOTE — Progress Notes (Signed)
[id number]) Hospital Liaison Note  Plan remains for patient to discharge home with hospice services once stable for discharge. Hospital liaison will continue to follow for discharge disposition.  Please send signed and completed DNR home with patient/family. Please provide prescriptions at discharge as needed to ensure ongoing symptom management.   Please do not hesitate to call with any hospice related questions or concerns.   Thank you for the opportunity to participate in this patient's care.   Nadene Rubins, RN, BSN [facility] Liaison [phone]

## 2021-06-23 NOTE — Progress Notes (Signed)
PROGRESS NOTE    Erik Fletcher.  KGY:[id number] DOB: 07-01-1950 DOA: 06/19/2021 PCP: Lawerance Cruel, MD   Chief Complaint  Patient presents with   Fall  Brief Narrative/Hospital Course: Erik Fletcher., 70 y.o. 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  On 12/26 he was walking to the bathroom and suddenly had loss of consciousness and fell backwards and was having back pain.  Has been having syncopal episodes with falls several times a month seen by oncology felt to be orthostatic hypotension and was started on midodrine in November and also had his cortisol checked on 11/11 that was up at 28.  Patient denies any chest pain palpitation shortness of breath nausea vomiting.  In the ED hemodynamically stable. WBC of 21K, hemoglobin of 10,lactate of 4. Sodium of 134, K of 3.9, creatinine was 0.85, BG of 93 CT head and cervical spine was negative for acute findings. CT chest shows moderate-sized left pleural effusion with compressive atelectasis.  There is also debris versus ingested material within the upper esophagus concerning for aspiration. Lumbar spine and pelvis was negative.   Subjective: Seen this morning.  Patient alert awake more oriented this morning has no complaints WC count remains elevated lactic acid downtrending  Assessment & Plan:  Recurrent syncope with loss of consciousness and falls: Cortisol level was up, at 28 In November-felt to be orthostatic and was placed on midodrine in November by his oncology.  Increased to 5 mg 3 times daily, given multiple fluid boluses.  Echocardiogram shows EF 55 to 60%, G1 DD moderate pleural effusion the left lateral region.  Vitals are stable at this time  Metastatic cholangiocarcinoma on chemotherapy by Dr. Learta Codding.  CT chest showed hypodense lesions/masses throughout the liver consistent with hepatic  metastasis scattered areas of free fluid/ascites in the upper abdomen.Discussed with Dr. Learta Codding, planning for home with hospice over the weekend. Discussed with patient's wife changed to DNR and patient is agreeable.    Acute metabolic encephalopathy/hospital-acquired delirium in the setting of his metastatic cancer, hospitalization.  Patient stable this morning, continue supportive care fall precaution.  Left-sided pleural effusion seen on the CT chest:In the setting of immunocompromise status placed on IV Unasyn/cefepime.S/p US thoracentesis  325 ml fluid removed-post procedure chest x-ray with no pneumothorax and no significant pleural effusion 12/28,  pleural fluid gram stain no organism, cytology shows mild acute and chronic inflammation.  Culture and gram stain no growth.   Mild left hand cellulitis patient is now on Unasyn .he had dog scratch.  Received tetanus booster in the ED  Lactic acidosis: Unclear etiology could be multifactorial was on metformin at home now here with pleural effusion, has  metastatic cholangiocarcinoma,-causing delay in clearance-  was discussed with PCCM, could be due to high tumor burden, patient was given multiple boluses, remains hemodynamically stable, afebrile.  Amylase lipase normal troponin negative-repeat lactic acidosis improving indicating slow clearance Recent Labs  Lab 06/20/21 1525 06/21/21 0500 06/23/21 0844  LATICACIDVEN 5.3* 6.3* 3.7*     Right bacterial conjunctivitis on erythromycin ointment 3 times daily  Leukocytosis:Count was WBC 1K on 12/14 on admission 21.2K and uptrended to 29k and now level of fluctuating remains elevated.  Continue antibiotics  Recent Labs  Lab 06/19/21 2005 06/20/21 0551 06/21/21 0500 06/22/21 0605 06/23/21 0625  WBC 21.2* 21.8* 29.1* 27.4* 28.4*     Goals of care: Discussed with patient, discussed with oncology,  oncology recommending home with hospice he has discussed this with his wife and the patient and  both of them are agreeable.  Pleural fluid cytology pending.  Patient is now DNR.   Overall prognosis poor to guarded DVT prophylaxis: enoxaparin (LOVENOX) injection 40 mg Start: 06/21/21 1000 SCDs Start: 06/20/21 0008 Code Status:   Code Status: DNR Family Communication: plan of care discussed with patient at bedside and with his wife on the phone 12/29 Status is: Inpatient Remains inpatient appropriate because: Ongoing management of pleural effusion orthostatic hypotension leukocytosis lactic acidosis Disposition: Currently not medically stable for discharge. Anticipated Disposition: Home with hospice over the weekend once okay with Dr. Learta Codding.  Objective: Vitals last 24 hrs: Vitals:   06/22/21 0453 06/22/21 1525 06/22/21 2106 06/23/21 0605  BP: 102/70 100/68 122/83 116/78  Pulse: (!) 110 79 79 (!) 110  Resp: 16 17 18 18   Temp: 97.6 F (36.4 C) 98.3 F (36.8 C) 98.3 F (36.8 C) 98.2 F (36.8 C)  TempSrc: Oral Oral Oral Oral  SpO2: 91% 93% 95% 93%   Weight change:   Intake/Output Summary (Last 24 hours) at 06/23/2021 1050 Last data filed at 06/23/2021 0854 Gross per 24 hour  Intake 1810 ml  Output 850 ml  Net 960 ml    Net IO Since Admission: 5,808.98 mL [06/23/21 1050]   Physical Examination: General exam: AAOx 3, frail, older than stated age, weak appearing. HEENT:Oral mucosa moist, Ear/Nose WNL grossly, dentition normal. Respiratory system: bilaterally diminished,no use of accessory muscle Cardiovascular system: S1 & S2 +, No JVD,. Gastrointestinal system: Abdomen soft, NT,ND, BS+ Nervous System:Alert, awake, moving extremities and grossly nonfocal Extremities: no edema, distal peripheral pulses palpable.  Skin: No rashes,no icterus. MSK: Normal muscle bulk,tone, power   Medications reviewed:  Scheduled Meds:  brimonidine  1 drop Right Eye BID   And   timolol  1 drop Right Eye BID   Chlorhexidine Gluconate Cloth  6 each Topical Daily   cycloSPORINE  1 drop  Both Eyes BID   enoxaparin (LOVENOX) injection  40 mg Subcutaneous Q24H   erythromycin   Both Eyes Once   erythromycin   Right Eye Q6H   latanoprost  1 drop Right Eye QHS   levothyroxine  100 mcg Oral q morning   midodrine  5 mg Oral TID WC   multivitamin with minerals  1 tablet Oral Daily   Continuous Infusions:  ampicillin-sulbactam (UNASYN) IV 3 g (06/23/21 0843)   ceFEPime (MAXIPIME) IV     lactated ringers 125 mL/hr at 06/23/21 0536    Diet Order             Diet regular Room service appropriate? Yes; Fluid consistency: Thin  Diet effective now                  Weight change:   Wt Readings from Last 3 Encounters:  06/06/21 78.9 kg  05/24/21 79.7 kg  05/11/21 77.6 kg   Consultants:see note  Procedures:see note Antimicrobials: Anti-infectives (From admission, onward)    Start     Dose/Rate Route Frequency Ordered Stop   06/20/21 0200  Ampicillin-Sulbactam (UNASYN) 3 g in sodium chloride 0.9 % 100 mL IVPB        3 g 200 mL/hr over 30 Minutes Intravenous Every 6 hours 06/20/21 0022 06/25/21 0159   06/19/21 2200  vancomycin (VANCOREADY) IVPB 1750 mg/350 mL        1,750 mg 175 mL/hr over 120 Minutes Intravenous  Once 06/19/21 2127  06/20/21 0856   06/19/21 2130  vancomycin (VANCOCIN) IVPB 1000 mg/200 mL premix  Status:  Discontinued        1,000 mg 200 mL/hr over 60 Minutes Intravenous  Once 06/19/21 2119 06/19/21 2125   06/19/21 2130  ceFEPIme (MAXIPIME) 2 g in sodium chloride 0.9 % 100 mL IVPB        2 g 200 mL/hr over 30 Minutes Intravenous  Once 06/19/21 2119        Culture/Microbiology    Component Value Date/Time   SDES  06/20/2021 1428    PLEURAL Performed at [facility], [place] [address], [place], [place] [address]    SPECREQUEST  06/20/2021 1428    NONE Performed at [facility], [place] [address], [place], [place] [address]    CULT  06/20/2021 1428    NO GROWTH 3 DAYS Performed at [facility] [address], [place], [place] [address]    REPTSTATUS PENDING 06/20/2021 1428    Other culture-see note  Unresulted Labs (From admission, onward)     Start     Ordered   06/20/21 0108  Legionella Pneumophila Serogp 1 Ur Ag  Once,   R        06/20/21 0107   06/20/21 0107  Strep pneumoniae urinary antigen  Once,   R        06/20/21 0107           Data Reviewed: I have personally reviewed following labs and imaging studies CBC: Recent Labs  Lab 06/19/21 2005 06/20/21 0551 06/21/21 0500 06/22/21 0605 06/23/21 0625  WBC 21.2* 21.8* 29.1* 27.4* 28.4*  NEUTROABS 17.8*  --   --   --   --   HGB 10.0* 9.9* 10.3* 10.8* 10.5*  HCT 30.0* 29.9* 31.4* 32.1* 31.3*  MCV 100.0 101.4* 102.3* 100.6* 100.6*  PLT 279 258 246 261 127    Basic Metabolic Panel: Recent Labs  Lab 06/19/21 2005 06/20/21 0551 06/21/21 0500 06/22/21 0605 06/23/21 0625  NA 134* 134* 133* 132* 132*  K 3.9 3.7 4.0 3.5 3.3*  CL 98 99 100 99 98  CO2 24 25 22 24 25   GLUCOSE 93 134* 129* 108* 111*  BUN 14 13 10 12 13   CREATININE 0.85 0.89 0.87 0.89 0.95  CALCIUM 8.5* 8.5* 7.7* 7.7* 7.7*  MG 1.8  --   --   --   --     GFR: CrCl cannot be calculated (Unknown ideal weight.). Liver Function Tests: Recent Labs  Lab 06/19/21 2005  AST 46*  ALT 21  ALKPHOS 440*  BILITOT 1.0  PROT 6.1*  ALBUMIN 2.5*    Recent Labs  Lab 06/20/21 1637  LIPASE 18  AMYLASE 84    No results for input(s): AMMONIA in the last 168 hours. Coagulation Profile: Recent Labs  Lab 06/19/21 2005  INR 1.1    Cardiac Enzymes: Recent Labs  Lab 06/19/21 2005  CKTOTAL 59    BNP (last 3 results) No results for input(s): PROBNP in the last 8760 hours. HbA1C: No results for input(s): HGBA1C in the last 72 hours. CBG: Recent Labs  Lab 06/21/21 0808  GLUCAP 117*    Lipid Profile: No results for input(s): CHOL, HDL, LDLCALC, TRIG, CHOLHDL, LDLDIRECT in the last 72 hours. Thyroid Function Tests: Recent Labs     06/20/21 1525  FREET4 0.60*  T3FREE 0.6*    Anemia Panel: No results for input(s): VITAMINB12, FOLATE, FERRITIN, TIBC, IRON, RETICCTPCT in  the last 72 hours. Sepsis Labs: Recent Labs  Lab 06/20/21 1154 06/20/21 1525 06/21/21 0500 06/23/21 0844  LATICACIDVEN 4.1* 5.3* 6.3* 3.7*     Recent Results (from the past 240 hour(s))  Resp Panel by RT-PCR (Flu A&B, Covid) Nasopharyngeal Swab     Status: None   Collection Time: 06/19/21  8:05 PM   Specimen: Nasopharyngeal Swab; Nasopharyngeal(NP) swabs in vial transport medium  Result Value Ref Range Status   SARS Coronavirus 2 by RT PCR NEGATIVE NEGATIVE Final    Comment: (NOTE) SARS-CoV-2 target nucleic acids are NOT DETECTED.  The SARS-CoV-2 RNA is generally detectable in upper respiratory specimens during the acute phase of infection. The lowest concentration of SARS-CoV-2 viral copies this assay can detect is 138 copies/mL. A negative result does not preclude SARS-Cov-2 infection and should not be used as the sole basis for treatment or other patient management decisions. A negative result may occur with  improper specimen collection/handling, submission of specimen other than nasopharyngeal swab, presence of viral mutation(s) within the areas targeted by this assay, and inadequate number of viral copies(<138 copies/mL). A negative result must be combined with clinical observations, patient history, and epidemiological information. The expected result is Negative.  Fact Sheet for Patients:  EntrepreneurPulse.com.au  Fact Sheet for Healthcare Providers:  IncredibleEmployment.be  This test is no t yet approved or cleared by the [place] FDA and  has been authorized for detection and/or diagnosis of SARS-CoV-2 by FDA under an Emergency Use Authorization (EUA). This EUA will remain  in effect (meaning this test can be used) for the duration of the COVID-19 declaration under Section  564(b)(1) of the Act, 21 U.S.C.section 360bbb-3(b)(1), unless the authorization is terminated  or revoked sooner.       Influenza A by PCR NEGATIVE NEGATIVE Final   Influenza B by PCR NEGATIVE NEGATIVE Final    Comment: (NOTE) The Xpert Xpress SARS-CoV-2/FLU/RSV plus assay is intended as an aid in the diagnosis of influenza from Nasopharyngeal swab specimens and should not be used as a sole basis for treatment. Nasal washings and aspirates are unacceptable for Xpert Xpress SARS-CoV-2/FLU/RSV testing.  Fact Sheet for Patients: EntrepreneurPulse.com.au  Fact Sheet for Healthcare Providers: IncredibleEmployment.be  This test is not yet approved or cleared by the [place] FDA and has been authorized for detection and/or diagnosis of SARS-CoV-2 by FDA under an Emergency Use Authorization (EUA). This EUA will remain in effect (meaning this test can be used) for the duration of the COVID-19 declaration under Section 564(b)(1) of the Act, 21 U.S.C. section 360bbb-3(b)(1), unless the authorization is terminated or revoked.  Performed at [facility], [place] [address], [place], [place] [address]   Blood Culture (routine x 2)     Status: None (Preliminary result)   Collection Time: 06/19/21 10:16 PM   Specimen: BLOOD  Result Value Ref Range Status   Specimen Description   Final    BLOOD RIGHT CHEST Performed at [facility] [address], [place], [place] [address]    Special Requests   Final    Blood Culture adequate volume BOTTLES DRAWN AEROBIC AND ANAEROBIC Performed at [facility] [address] [place], [place] [address]    Culture   Final    NO GROWTH 3 DAYS Performed at [facility], [place] [address] [place], [place] [address]    Report Status PENDING  Incomplete  Blood Culture (routine x 2)     Status: None (Preliminary result)  Collection Time: 06/19/21 10:16 PM    Specimen: BLOOD  Result Value Ref Range Status   Specimen Description   Final    BLOOD BLOOD RIGHT WRIST Performed at [facility] [address] [place], [place] [address]    Special Requests   Final    Blood Culture results may not be optimal due to an inadequate volume of blood received in culture bottles BOTTLES DRAWN AEROBIC AND ANAEROBIC Performed at [facility], [place] [address] [place], [place] [address]    Culture   Final    NO GROWTH 3 DAYS Performed at [facility], [place] [address], [place], [place] [address]    Report Status PENDING  Incomplete  Body fluid culture w Gram Stain     Status: None (Preliminary result)   Collection Time: 06/20/21  2:28 PM   Specimen: PATH Cytology Pleural fluid  Result Value Ref Range Status   Specimen Description   Final    PLEURAL Performed at [facility] [address], [place], [place] [address]    Special Requests   Final    NONE Performed at [facility], [place] [address] [place], [place] [address]    Gram Stain   Final    FEW WBC PRESENT, PREDOMINANTLY MONONUCLEAR NO ORGANISMS SEEN    Culture   Final    NO GROWTH 3 DAYS Performed at [facility] [address], [place], [place] [address]    Report Status PENDING  Incomplete      Radiology Studies: ECHOCARDIOGRAM COMPLETE  Result Date: 06/21/2021    ECHOCARDIOGRAM REPORT   Patient Name:   Eran Mistry. Date of Exam: 06/21/2021 Medical Rec #:  [id number]               Height:       68.0 in Accession #:    [id number]              Weight:       174.0 lb Date of Birth:  Jun 02, 1951              BSA:          1.926 m Patient Age:    39 years                BP:           129/88 mmHg Patient Gender: M                       HR:           79 bpm. Exam Location:  Inpatient Procedure: 2D Echo, Cardiac Doppler and Color Doppler Indications:    R55 Syncope  History:        Patient has no  prior history of Echocardiogram examinations.  Sonographer:    Glo Herring Referring Phys: [id number] [place]  1. Left ventricular ejection fraction, by estimation, is 55 to 60%. The left ventricle has normal function. The left ventricle has no regional wall motion abnormalities. Left ventricular diastolic parameters are consistent with Grade I diastolic dysfunction (impaired relaxation).  2. Right ventricular systolic function is normal. The right ventricular size is normal. There is normal pulmonary artery systolic pressure.  3. Moderate pleural effusion in the left lateral region.  4. The mitral valve is normal in structure. Trivial mitral valve regurgitation. No evidence of mitral stenosis.  5. The aortic valve is  tricuspid. Aortic valve regurgitation is not visualized. No aortic stenosis is present.  6. Aortic dilatation noted. There is mild dilatation of the aortic root, measuring 39 mm.  7. The inferior vena cava is normal in size with greater than 50% respiratory variability, suggesting right atrial pressure of 3 mmHg. FINDINGS  Left Ventricle: Left ventricular ejection fraction, by estimation, is 55 to 60%. The left ventricle has normal function. The left ventricle has no regional wall motion abnormalities. The left ventricular internal cavity size was normal in size. There is  no left ventricular hypertrophy. Left ventricular diastolic parameters are consistent with Grade I diastolic dysfunction (impaired relaxation). Right Ventricle: The right ventricular size is normal. No increase in right ventricular wall thickness. Right ventricular systolic function is normal. There is normal pulmonary artery systolic pressure. The tricuspid regurgitant velocity is 1.72 m/s, and  with an assumed right atrial pressure of 3 mmHg, the estimated right ventricular systolic pressure is 41.6 mmHg. Left Atrium: Left atrial size was normal in size. Right Atrium: Right atrial size was normal in size.  Pericardium: Trivial pericardial effusion is present. Mitral Valve: The mitral valve is normal in structure. Trivial mitral valve regurgitation. No evidence of mitral valve stenosis. Tricuspid Valve: The tricuspid valve is normal in structure. Tricuspid valve regurgitation is trivial. No evidence of tricuspid stenosis. Aortic Valve: The aortic valve is tricuspid. Aortic valve regurgitation is not visualized. No aortic stenosis is present. Aortic valve mean gradient measures 2.0 mmHg. Aortic valve peak gradient measures 3.4 mmHg. Aortic valve area, by VTI measures 2.65 cm. Pulmonic Valve: The pulmonic valve was normal in structure. Pulmonic valve regurgitation is trivial. No evidence of pulmonic stenosis. Aorta: Aortic dilatation noted. There is mild dilatation of the aortic root, measuring 39 mm. Venous: The inferior vena cava is normal in size with greater than 50% respiratory variability, suggesting right atrial pressure of 3 mmHg. IAS/Shunts: No atrial level shunt detected by color flow Doppler. Additional Comments: There is a moderate pleural effusion in the left lateral region.  LEFT VENTRICLE PLAX 2D LVOT diam:     1.90 cm     Diastology LV SV:         50          LV e' medial:    7.07 cm/s LV SV Index:   26          LV E/e' medial:  6.7 LVOT Area:     2.84 cm    LV e' lateral:   6.96 cm/s                            LV E/e' lateral: 6.8  LV Volumes (MOD) LV vol d, MOD A2C: 35.3 ml LV vol d, MOD A4C: 56.8 ml LV vol s, MOD A2C: 15.2 ml LV vol s, MOD A4C: 25.2 ml LV SV MOD A2C:     20.1 ml LV SV MOD A4C:     56.8 ml LV SV MOD BP:      25.6 ml RIGHT VENTRICLE             IVC RV Basal diam:  4.30 cm     IVC diam: 1.40 cm RV S prime:     13.70 cm/s LEFT ATRIUM             Index        RIGHT ATRIUM           Index LA Vol (  A2C):   31.0 ml 16.09 ml/m  RA Area:     11.30 cm LA Vol (A4C):   52.8 ml 27.41 ml/m  RA Volume:   21.10 ml  10.95 ml/m LA Biplane Vol: 44.5 ml 23.10 ml/m  AORTIC VALVE                     PULMONIC VALVE AV Area (Vmax):    2.61 cm     PV Vmax:       0.94 m/s AV Area (Vmean):   2.79 cm     PV Peak grad:  3.6 mmHg AV Area (VTI):     2.65 cm AV Vmax:           92.10 cm/s AV Vmean:          65.700 cm/s AV VTI:            0.188 m AV Peak Grad:      3.4 mmHg AV Mean Grad:      2.0 mmHg LVOT Vmax:         84.90 cm/s LVOT Vmean:        64.700 cm/s LVOT VTI:          0.176 m LVOT/AV VTI ratio: 0.94  AORTA Ao Root diam: 3.90 cm Ao Asc diam:  2.80 cm MITRAL VALVE               TRICUSPID VALVE MV Area (PHT): 4.57 cm    TR Peak grad:   11.8 mmHg MV Decel Time: 166 msec    TR Vmax:        172.00 cm/s MV E velocity: 47.60 cm/s MV A velocity: 66.40 cm/s  SHUNTS MV E/A ratio:  0.72        Systemic VTI:  0.18 m                            Systemic Diam: 1.90 cm Skeet Latch MD Electronically signed by Skeet Latch MD Signature Date/Time: 06/21/2021/5:30:05 PM    Final      LOS: 4 days   Antonieta Pert, MD [facility]  06/23/2021, 10:50 AM

## 2021-06-24 ENCOUNTER — Inpatient Hospital Stay (HOSPITAL_COMMUNITY): Payer: Medicare Other

## 2021-06-24 DIAGNOSIS — M7989 Other specified soft tissue disorders: Secondary | ICD-10-CM

## 2021-06-24 DIAGNOSIS — J90 Pleural effusion, not elsewhere classified: Secondary | ICD-10-CM | POA: Diagnosis not present

## 2021-06-24 LAB — CULTURE, BLOOD (ROUTINE X 2)
Culture: NO GROWTH
Culture: NO GROWTH
Special Requests: ADEQUATE

## 2021-06-24 LAB — CBC
HCT: 30 % — ABNORMAL LOW (ref 39.0–52.0)
Hemoglobin: 10.3 g/dL — ABNORMAL LOW (ref 13.0–17.0)
MCH: 34.4 pg — ABNORMAL HIGH (ref 26.0–34.0)
MCHC: 34.3 g/dL (ref 30.0–36.0)
MCV: 100.3 fL — ABNORMAL HIGH (ref 80.0–100.0)
Platelets: 249 10*3/uL (ref 150–400)
RBC: 2.99 MIL/uL — ABNORMAL LOW (ref 4.22–5.81)
RDW: 15.9 % — ABNORMAL HIGH (ref 11.5–15.5)
WBC: 23.6 10*3/uL — ABNORMAL HIGH (ref 4.0–10.5)
nRBC: 0 % (ref 0.0–0.2)

## 2021-06-24 LAB — BODY FLUID CULTURE W GRAM STAIN: Culture: NO GROWTH

## 2021-06-24 NOTE — Progress Notes (Signed)
IP PROGRESS NOTE  Subjective:   Erik Fletcher reports feeling better.  He has less pain.  He wants to get out of bed and eat this morning.  Objective: Vital signs in last 24 hours: Blood pressure 127/85, pulse 82, temperature 97.7 F (36.5 C), temperature source Oral, resp. rate 18, SpO2 96 %.  Intake/Output from previous day: 12/31 0701 - 01/01 0700 In: 2765.3 [P.O.:360; I.V.:2019.9; IV Piggyback:385.5] Out: 1000 [Urine:1000]  Physical Exam:  HEENT: No thrush or ulcers Lungs: Distant breath sounds, no respiratory distress Cardiac: Regular rate and rhythm Abdomen: The liver edge is palpable in the right upper abdomen, no splenomegaly Extremities: Trace edema throughout the left leg Neurologic: Follows commands, oriented to place and diagnosis  Portacath/PICC-without erythema  Lab Results: Recent Labs    06/22/21 0605 06/23/21 0625  WBC 27.4* 28.4*  HGB 10.8* 10.5*  HCT 32.1* 31.3*  PLT 261 260    BMET Recent Labs    06/22/21 0605 06/23/21 0625  NA 132* 132*  K 3.5 3.3*  CL 99 98  CO2 24 25  GLUCOSE 108* 111*  BUN 12 13  CREATININE 0.89 0.95  CALCIUM 7.7* 7.7*    Lab Results  Component Value Date   CEA 54.5 (H) 12/01/2020   MAU633 35,456 (H) 06/06/2021    Studies/Results: No results found.  Medications: I have reviewed the patient's current medications.  Assessment/Plan: Intrahepatic cholangiocarcinoma, clinical stage IIIb (T2N1M0) Abdominal ultrasound 11/24/2020-by lobar hepatic lesions including a large infiltrative right liver lesion and heterogenous mass superior to the pancreas head MRI abdomen 11/29/2020-numerous hypoenhancing by lobar liver lesions suggestive of cholangiocarcinoma, multiple abnormal portacaval and celiac axis/gastropathic ligament nodes consistent with metastatic disease Ultrasound-guided biopsy of a right liver lesion 12/11/2020-adenocarcinoma, immunohistochemical profile and morphology consistent with a pancreaticobiliary  primary MSS, tumor mutation burden-1, no FGFR2 alteration Markedly elevated CA 19-9 CT 12/20/2020-numerous right and left liver lesions, numerous portacaval, celiac, and gastropathic ligament nodes, no evidence of metastatic disease in the chest Cycle 1 gemcitabine/cisplatin/Durvalumab 12/28/2020 Cycle 2 gemcitabine/cisplatin/Durvalumab 01/19/2021 Cycle 3 gemcitabine/cisplatin/Durvalumab 02/09/2021, day 8 held due to hypotension/dehydration CTs 02/27/2021-new and enlarging hepatic metastatic disease and enlarging periportal/gastrohepatic ligament adenopathy. Cycle 1 FOLFOX 03/06/2021 Cycle 2 FOLFOX 03/21/2021 Cycle 3 FOLFOX 05/24/2021 Glaucoma Anorexia/weight loss secondary to #1 Pain secondary to #1 Port-A-Cath placement 12/27/2020 TSH markedly elevated 05/04/2021-Euthyrox increased from 50 mcg to 100 mcg Severe neutropenia following cycle 3 FOLFOX, chemotherapy held 06/06/2021, Udenyca to be added with cycle 4 Admission 06/19/2021 following a fall New left effusion on chest CT 06/19/2021 Altered mental status    Erik Fletcher appears less confused.  He remains in a weakened condition.  The pleural fluid cytology is not diagnostic of malignancy, though I suspect he has malignant effusion.  Cultures remain negative.  No source for infection has been identified.  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 He has a swollen left leg.  We will check a Doppler to rule out a deep vein thrombosis Recommendations: 1.  Check left leg Doppler to rule out a deep vein thrombosis 2.   Refer to [facility] for home hospice care 3.  Consider increasing the thyroid hormone replacement dose due to the elevated TSH, dose was increased in November 4.  Outpatient follow-up will be scheduled at the Cancer center    LOS: 5 days   Betsy Coder, MD   06/24/2021, 6:59 AM

## 2021-06-24 NOTE — Progress Notes (Signed)
[id number]) Hospital Liaison Note   Plan remains for patient to discharge home with hospice services once stable for discharge. Hospital liaison will continue to follow for discharge disposition.   Please send signed and completed DNR home with patient/family. Please provide prescriptions at discharge as needed to ensure ongoing symptom management.    Please do not hesitate to call with any hospice related questions or concerns.    Thank you for the opportunity to participate in this patient's care.  [facility] Liaison [phone]

## 2021-06-24 NOTE — Progress Notes (Signed)
PROGRESS NOTE    Erik Fletcher.  VEH:[id number] DOB: 04/12/1951 DOA: 06/19/2021 PCP: Lawerance Cruel, MD   Chief Complaint  Patient presents with   Fall  Brief Narrative/Hospital Course: Erik Fletcher., 71 y.o. 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  On 12/26 he was walking to the bathroom and suddenly had loss of consciousness and fell backwards and was having back pain.  Has been having syncopal episodes with falls several times a month seen by oncology felt to be orthostatic hypotension and was started on midodrine in November and also had his cortisol checked on 11/11 that was up at 28.  Patient denies any chest pain palpitation shortness of breath nausea vomiting. In the ED hemodynamically stable. WBC of 21K, hemoglobin of 10,lactate of 4. Sodium of 134, K of 3.9, creatinine was 0.85, BG of 93 CT head and cervical spine was negative for acute findings. CT chest shows moderate-sized left pleural effusion with compressive atelectasis.    Subjective:  Seen and examined appears very weak and deconditioned and frail not sure about going home . Blood pressure stable Did very poorly with PT OT needing maximum assistance 2+ and advised SNF  Assessment & Plan:  Recurrent syncope with loss of consciousness and falls: Cortisol level was up, at 28 In November-felt to be orthostatic and was placed on midodrine in November by his oncology.  Continue MIDODRINE 10 mg 3 times, compression stockings, consider abdominal binder if recurs. PT OT eval completed has advised skilled nursing facility . His wife also has cancer and 2 daughters have been managing at home they prefer him to come home but now needing mas assist which I doubt daughters will be able to provide.  Patient has had multiple fluid boluses echocardiogram with preserved EF G1 DD.  Metastatic  cholangiocarcinoma on chemotherapy by Dr. Learta Codding.  CT chest showed hypodense lesions/masses throughout the liver consistent with hepatic metastasis scattered areas of free fluid/ascites in the upper abdomen.Discussed with Dr. Learta Codding and unfortunately will not be able to tolerate further chemotherapy and have advised home with hospice   Failure to thrive: In the setting of malignancy worsening.  Patient has had very poor intake per nursing staff.  Now he is not able to ambulate on his own.  Acute metabolic encephalopathy/hospital-acquired delirium in the setting of his metastatic cancer, hospitalization. improved.  Left-sided pleural effusion seen on the CT chest:In the setting of immunocompromise status placed on IV Unasyn-leukocytosis downtrending, completed antibiotics.  Wondering if pleural effusion is malignant but cytology negative and ultrasound thoracentesis-s/p US thoracentesis  325 ml fluid removed-post procedure chest x-ray with no pneumothorax and no significant pleural effusion 12/28,Culture -gram stain no growth.  He is saturating 92 to 90% on room air.  Mild left hand cellulitis completed Unasyn.he had dog scratch.  Received tetanus booster in the ED.  Lactic acidosis: Unclear etiology could be multifactorial was on metformin at home now here with pleural effusion, has  metastatic cholangiocarcinoma,-causing delay in clearance-  was discussed with PCCM, could be due to high tumor burden, patient was given multiple boluses, remains hemodynamically stable, afebrile.  Amylase lipase normal troponin negative-repeat lactic acidosis improving indicating slow clearance.  Overall poor prognosis indicator. Recent Labs  Lab 06/20/21 1525 06/21/21 0500 06/23/21 0844  LATICACIDVEN 5.3* 6.3* 3.7*    Right bacterial conjunctivitis on erythromycin ointment 3 times daily  Hypothyroidism with TSH 39 Free T4  also low Synthroid dose was increased  Leukocytosis:WBC level have been fluctuating  suspect in the setting of metastatic disease.  Currently afebrile.  Recent Labs  Lab 06/21/21 0500 06/22/21 0605 06/23/21 0625 06/24/21 0849 06/26/21 0524  WBC 29.1* 27.4* 28.4* 23.6* 27.9*   Lower leg edema-negative for DVT in duplex.   Goals of care: DNR, plan is for home with hospice but at this time may need a skilled nursing facility.  Palliative care not involved since Dr. Ammie Dalton following.  Overall prognosis poor to guarded. Discussed with patient's daughter 1/2 TOC consult for dispo.  Given his deconditioning, poor oral intake-?? If he is residential hospice appropriate. Messaged Dr Learta Codding.  DVT prophylaxis: enoxaparin (LOVENOX) injection 40 mg Start: 06/21/21 1000 SCDs Start: 06/20/21 0008 Code Status:   Code Status: DNR Family Communication: plan of care discussed with patient at bedside. I had discussed with his wife and daughter previously.   Status is: Inpatient Remains inpatient appropriate because: Ongoing management of pleural effusion orthostatic hypotension leukocytosis lactic acidosis Disposition: Currently not medically stable for discharge.  Unsafe disposition Anticipated Disposition: SNF vs Home with hospice.  Objective: Vitals last 24 hrs: Vitals:   06/25/21 1403 06/25/21 1935 06/26/21 0436 06/26/21 1311  BP: 112/76 (!) 150/97 (!) 137/107 130/82  Pulse: (!) 102 85 (!) 109 81  Resp: 18 16 14 20   Temp: 97.7 F (36.5 C) 97.6 F (36.4 C) 98.9 F (37.2 C) 97.8 F (36.6 C)  TempSrc: Oral Oral Oral Oral  SpO2: 94% 93% 93% 93%   Weight change:   Intake/Output Summary (Last 24 hours) at 06/26/2021 1321 Last data filed at 06/26/2021 1000 Gross per 24 hour  Intake 5647.52 ml  Output 1850 ml  Net 3797.52 ml   Net IO Since Admission: 11,244.81 mL [06/26/21 1321]   Physical Examination: General exam: AAOx 3, ill looking,  HEENT:Oral mucosa moist, Ear/Nose WNL grossly, dentition normal. Respiratory system: bilaterally clear, no use of accessory  muscle Cardiovascular system: S1 & S2 +, No JVD,. Gastrointestinal system: Abdomen soft, NT,ND, BS+ Nervous System:Alert, awake, moving extremities and grossly nonfocal Extremities: mild edema, distal peripheral pulses palpable.  Skin: No rashes,no icterus. MSK: Normal muscle bulk,tone, power   Medications reviewed:  Scheduled Meds:  brimonidine  1 drop Right Eye BID   And   timolol  1 drop Right Eye BID   Chlorhexidine Gluconate Cloth  6 each Topical Daily   cycloSPORINE  1 drop Both Eyes BID   enoxaparin (LOVENOX) injection  40 mg Subcutaneous Q24H   erythromycin   Right Eye Q6H   latanoprost  1 drop Right Eye QHS   levothyroxine  125 mcg Oral q morning   midodrine  10 mg Oral TID WC   multivitamin with minerals  1 tablet Oral Daily   Continuous Infusions:  lactated ringers 125 mL/hr at 06/26/21 0957    Diet Order             Diet regular Room service appropriate? Yes; Fluid consistency: Thin  Diet effective now                  Weight change:   Wt Readings from Last 3 Encounters:  06/06/21 78.9 kg  05/24/21 79.7 kg  05/11/21 77.6 kg   Consultants:see note  Procedures:see note Antimicrobials: Anti-infectives (From admission, onward)    Start     Dose/Rate Route Frequency Ordered Stop   06/20/21 0200  Ampicillin-Sulbactam (UNASYN) 3 g in sodium chloride 0.9 % 100 mL IVPB  3 g 200 mL/hr over 30 Minutes Intravenous Every 6 hours 06/20/21 0022 06/24/21 2057   06/19/21 2200  vancomycin (VANCOREADY) IVPB 1750 mg/350 mL        1,750 mg 175 mL/hr over 120 Minutes Intravenous  Once 06/19/21 2127 06/20/21 0856   06/19/21 2130  vancomycin (VANCOCIN) IVPB 1000 mg/200 mL premix  Status:  Discontinued        1,000 mg 200 mL/hr over 60 Minutes Intravenous  Once 06/19/21 2119 06/19/21 2125   06/19/21 2130  ceFEPIme (MAXIPIME) 2 g in sodium chloride 0.9 % 100 mL IVPB  Status:  Discontinued        2 g 200 mL/hr over 30 Minutes Intravenous  Once 06/19/21 2119 06/25/21  0917      Culture/Microbiology    Component Value Date/Time   SDES  06/20/2021 1428    PLEURAL Performed at [facility], [place] [address], [place], [place] [address]    SPECREQUEST  06/20/2021 1428    NONE Performed at [facility], [place] [address], [place], [place] [address]    CULT  06/20/2021 1428    NO GROWTH Performed at [facility], [place] [address], [place], [place] [address]    REPTSTATUS 06/24/2021 FINAL 06/20/2021 1428    Other culture-see note  Unresulted Labs (From admission, onward)     Start     Ordered   06/20/21 0108  Legionella Pneumophila Serogp 1 Ur Ag  Once,   R        06/20/21 0107   06/20/21 0107  Strep pneumoniae urinary antigen  Once,   R        06/20/21 0107           Data Reviewed: I have personally reviewed following labs and imaging studies CBC: Recent Labs  Lab 06/19/21 2005 06/20/21 0551 06/21/21 0500 06/22/21 0605 06/23/21 0625 06/24/21 0849 06/26/21 0524  WBC 21.2*   < > 29.1* 27.4* 28.4* 23.6* 27.9*  NEUTROABS 17.8*  --   --   --   --   --   --   HGB 10.0*   < > 10.3* 10.8* 10.5* 10.3* 10.7*  HCT 30.0*   < > 31.4* 32.1* 31.3* 30.0* 32.8*  MCV 100.0   < > 102.3* 100.6* 100.6* 100.3* 101.5*  PLT 279   < > 246 261 260 249 270   < > = values in this interval not displayed.   Basic Metabolic Panel: Recent Labs  Lab 06/19/21 2005 06/20/21 0551 06/21/21 0500 06/22/21 0605 06/23/21 0625 06/26/21 0524  NA 134* 134* 133* 132* 132* 137  K 3.9 3.7 4.0 3.5 3.3* 3.8  CL 98 99 100 99 98 98  CO2 24 25 22 24 25 25   GLUCOSE 93 134* 129* 108* 111* 119*  BUN 14 13 10 12 13 13   CREATININE 0.85 0.89 0.87 0.89 0.95 0.76  CALCIUM 8.5* 8.5* 7.7* 7.7* 7.7* 8.3*  MG 1.8  --   --   --   --   --    GFR: CrCl cannot be calculated (Unknown ideal weight.). Liver Function Tests: Recent Labs  Lab 06/19/21 2005  AST 46*  ALT 21  ALKPHOS 440*  BILITOT 1.0  PROT 6.1*  ALBUMIN 2.5*   Recent Labs   Lab 06/20/21 1637  LIPASE 18  AMYLASE 84   No results for input(s): AMMONIA in the last 168 hours. Coagulation Profile: Recent Labs  Lab 06/19/21 2005  INR 1.1  Cardiac Enzymes: Recent Labs  Lab 06/19/21 2005  CKTOTAL 59   BNP (last 3 results) No results for input(s): PROBNP in the last 8760 hours. HbA1C: No results for input(s): HGBA1C in the last 72 hours. CBG: Recent Labs  Lab 06/21/21 0808  GLUCAP 117*   Lipid Profile: No results for input(s): CHOL, HDL, LDLCALC, TRIG, CHOLHDL, LDLDIRECT in the last 72 hours. Thyroid Function Tests: No results for input(s): TSH, T4TOTAL, FREET4, T3FREE, THYROIDAB in the last 72 hours.  Anemia Panel: No results for input(s): VITAMINB12, FOLATE, FERRITIN, TIBC, IRON, RETICCTPCT in the last 72 hours. Sepsis Labs: Recent Labs  Lab 06/20/21 1154 06/20/21 1525 06/21/21 0500 06/23/21 0844  LATICACIDVEN 4.1* 5.3* 6.3* 3.7*    Recent Results (from the past 240 hour(s))  Resp Panel by RT-PCR (Flu A&B, Covid) Nasopharyngeal Swab     Status: None   Collection Time: 06/19/21  8:05 PM   Specimen: Nasopharyngeal Swab; Nasopharyngeal(NP) swabs in vial transport medium  Result Value Ref Range Status   SARS Coronavirus 2 by RT PCR NEGATIVE NEGATIVE Final    Comment: (NOTE) SARS-CoV-2 target nucleic acids are NOT DETECTED.  The SARS-CoV-2 RNA is generally detectable in upper respiratory specimens during the acute phase of infection. The lowest concentration of SARS-CoV-2 viral copies this assay can detect is 138 copies/mL. A negative result does not preclude SARS-Cov-2 infection and should not be used as the sole basis for treatment or other patient management decisions. A negative result may occur with  improper specimen collection/handling, submission of specimen other than nasopharyngeal swab, presence of viral mutation(s) within the areas targeted by this assay, and inadequate number of viral copies(<138 copies/mL). A negative  result must be combined with clinical observations, patient history, and epidemiological information. The expected result is Negative.  Fact Sheet for Patients:  EntrepreneurPulse.com.au  Fact Sheet for Healthcare Providers:  IncredibleEmployment.be  This test is no t yet approved or cleared by the [place] FDA and  has been authorized for detection and/or diagnosis of SARS-CoV-2 by FDA under an Emergency Use Authorization (EUA). This EUA will remain  in effect (meaning this test can be used) for the duration of the COVID-19 declaration under Section 564(b)(1) of the Act, 21 U.S.C.section 360bbb-3(b)(1), unless the authorization is terminated  or revoked sooner.       Influenza A by PCR NEGATIVE NEGATIVE Final   Influenza B by PCR NEGATIVE NEGATIVE Final    Comment: (NOTE) The Xpert Xpress SARS-CoV-2/FLU/RSV plus assay is intended as an aid in the diagnosis of influenza from Nasopharyngeal swab specimens and should not be used as a sole basis for treatment. Nasal washings and aspirates are unacceptable for Xpert Xpress SARS-CoV-2/FLU/RSV testing.  Fact Sheet for Patients: EntrepreneurPulse.com.au  Fact Sheet for Healthcare Providers: IncredibleEmployment.be  This test is not yet approved or cleared by the [place] FDA and has been authorized for detection and/or diagnosis of SARS-CoV-2 by FDA under an Emergency Use Authorization (EUA). This EUA will remain in effect (meaning this test can be used) for the duration of the COVID-19 declaration under Section 564(b)(1) of the Act, 21 U.S.C. section 360bbb-3(b)(1), unless the authorization is terminated or revoked.  Performed at [facility], [place] [address] [place], [place] [address]   Blood Culture (routine x 2)     Status: None   Collection Time: 06/19/21 10:16 PM   Specimen: BLOOD  Result Value Ref Range Status    Specimen Description   Final    BLOOD RIGHT  CHEST Performed at [facility], [place] [address] [place], [place] [address]    Special Requests   Final    Blood Culture adequate volume BOTTLES DRAWN AEROBIC AND ANAEROBIC Performed at [place] [address] [place], [place] [address]    Culture   Final    NO GROWTH 5 DAYS Performed at [facility], [place] [address], [place], [place] [address]    Report Status 06/24/2021 FINAL  Final  Blood Culture (routine x 2)     Status: None   Collection Time: 06/19/21 10:16 PM   Specimen: BLOOD  Result Value Ref Range Status   Specimen Description   Final    BLOOD BLOOD RIGHT WRIST Performed at [place] [address], [place], [place] [address]    Special Requests   Final    Blood Culture results may not be optimal due to an inadequate volume of blood received in culture bottles BOTTLES DRAWN AEROBIC AND ANAEROBIC Performed at [facility], [place] [address], [place], [place] [address]    Culture   Final    NO GROWTH 5 DAYS Performed at [facility], [place] [address], [place], [place] [address]    Report Status 06/24/2021 FINAL  Final  Body fluid culture w Gram Stain     Status: None   Collection Time: 06/20/21  2:28 PM   Specimen: PATH Cytology Pleural fluid  Result Value Ref Range Status   Specimen Description   Final    PLEURAL Performed at [place] [address], [place], [place] [address]    Special Requests   Final    NONE Performed at [facility], [place] [address] [place], [place] [address]    Gram Stain   Final    FEW WBC PRESENT, PREDOMINANTLY MONONUCLEAR NO ORGANISMS SEEN    Culture   Final    NO GROWTH Performed at [facility], [place] [address], [place], [place] [address]    Report Status 06/24/2021 FINAL  Final      Radiology Studies: VAS [place] LOWER EXTREMITY VENOUS  (DVT)  Result Date: 06/24/2021  Lower Venous DVT Study Patient Name:  Laval Cafaro.  Date of Exam:   06/24/2021 Medical Rec #: [id number]                Accession #:    [id number] Date of Birth: [birth year]               Patient Gender: M Patient Age:   18 years Exam Location:  [facility] Procedure:      VAS [place] LOWER EXTREMITY VENOUS (DVT) Referring Phys: Betsy Coder --------------------------------------------------------------------------------  Indications: Swelling and tenderness LT>RT.  Comparison Study: 05-24-2021 Prior bilateral lower extremity venous was negative                   for DVT. Performing Technologist: Darlin Coco RDMS, RVT  Examination Guidelines: A complete evaluation includes B-mode imaging, spectral Doppler, color Doppler, and power Doppler as needed of all accessible portions of each vessel. Bilateral testing is considered an integral part of a complete examination. Limited examinations for reoccurring indications may be performed as noted. The reflux portion of the exam is performed with the patient in reverse Trendelenburg.  +-----+---------------+---------+-----------+----------+--------------+  RIGHT Compressibility Phasicity Spontaneity Properties Thrombus Aging  +-----+---------------+---------+-----------+----------+--------------+  CFV   Full  Yes       Yes                                    +-----+---------------+---------+-----------+----------+--------------+   +---------+---------------+---------+-----------+----------+--------------+  LEFT      Compressibility Phasicity Spontaneity Properties Thrombus Aging  +---------+---------------+---------+-----------+----------+--------------+  CFV       Full            Yes       Yes                                    +---------+---------------+---------+-----------+----------+--------------+  SFJ       Full                                                              +---------+---------------+---------+-----------+----------+--------------+  FV Prox   Full                                                             +---------+---------------+---------+-----------+----------+--------------+  FV Mid    Full                                                             +---------+---------------+---------+-----------+----------+--------------+  FV Distal Full                                                             +---------+---------------+---------+-----------+----------+--------------+  PFV       Full                                                             +---------+---------------+---------+-----------+----------+--------------+  POP       Full            Yes       Yes                                    +---------+---------------+---------+-----------+----------+--------------+  PTV       Full                                                             +---------+---------------+---------+-----------+----------+--------------+  PERO      Full                                                             +---------+---------------+---------+-----------+----------+--------------+    Summary: RIGHT: - No evidence of common femoral vein obstruction.  LEFT: - There is no evidence of deep vein thrombosis in the lower extremity.  - No cystic structure found in the popliteal fossa.  *See table(s) above for measurements and observations. Electronically signed by Monica Martinez MD on 06/24/2021 at 4:47:57 PM.    Final      LOS: 7 days   Antonieta Pert, MD [facility]  06/26/2021, 1:21 PM

## 2021-06-24 NOTE — Progress Notes (Signed)
PROGRESS NOTE    Erik Fletcher.  HGD:[id number] DOB: [birth year] DOA: 06/19/2021 PCP: Lawerance Cruel, MD   Chief Complaint  Patient presents with   Fall  Brief Narrative/Hospital Course: Erik Fletcher., 71 y.o. 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  On 12/26 he was walking to the bathroom and suddenly had loss of consciousness and fell backwards and was having back pain.  Has been having syncopal episodes with falls several times a month seen by oncology felt to be orthostatic hypotension and was started on midodrine in November and also had his cortisol checked on 11/11 that was up at 28.  Patient denies any chest pain palpitation shortness of breath nausea vomiting.  In the ED hemodynamically stable. WBC of 21K, hemoglobin of 10,lactate of 4. Sodium of 134, K of 3.9, creatinine was 0.85, BG of 93 CT head and cervical spine was negative for acute findings. CT chest shows moderate-sized left pleural effusion with compressive atelectasis.  There is also debris versus ingested material within the upper esophagus concerning for aspiration. Lumbar spine and pelvis was negative.   Subjective: Afebrile this morning.  He has not gotten up and would like to get up today.  Does not feel ready for discharge today.   Leukocytosis improving. Assessment & Plan:  Recurrent syncope with loss of consciousness and falls: Cortisol level was up, at 28 In November-felt to be orthostatic and was placed on midodrine in November by his oncology.  Increased to 5 mg 3 times daily, given multiple fluid boluses.  Echocardiogram shows EF 55 to 60%, G1 DD moderate pleural effusion the left lateral region.  Vitals are stable at this time  Metastatic cholangiocarcinoma on chemotherapy by Dr. Learta Codding.  CT chest showed hypodense lesions/masses throughout the liver consistent with hepatic  metastasis scattered areas of free fluid/ascites in the upper abdomen.Discussed with Dr. Learta Codding again today, anticipating discharge to home with hospice likely tomorrow.   Acute metabolic encephalopathy/hospital-acquired delirium in the setting of his metastatic cancer, hospitalization.  Mentation is stable.  Left-sided pleural effusion seen on the CT chest:In the setting of immunocompromise status placed on IV Unasyn-leukocytosis downtrending,s/p US thoracentesis  325 ml fluid removed-post procedure chest x-ray with no pneumothorax and no significant pleural effusion 12/28,  pleural fluid gram stain no organism, cytology shows mild acute and chronic inflammation.  Culture and gram stain no growth.   Mild left hand cellulitis patient is now on Unasyn .he had dog scratch.  Received tetanus booster in the ED  Lactic acidosis: Unclear etiology could be multifactorial was on metformin at home now here with pleural effusion, has  metastatic cholangiocarcinoma,-causing delay in clearance-  was discussed with PCCM, could be due to high tumor burden, patient was given multiple boluses, remains hemodynamically stable, afebrile.  Amylase lipase normal troponin negative-repeat lactic acidosis improving indicating slow clearance Recent Labs  Lab 06/20/21 1525 06/21/21 0500 06/23/21 0844  LATICACIDVEN 5.3* 6.3* 3.7*     Right bacterial conjunctivitis on erythromycin ointment 3 times daily  Leukocytosis:Count was WBC 1K on 12/14 on admission 21.2K and uptrended to 29k and now level of fluctuating remains elevated-but improving.  Afebrile. Recent Labs  Lab 06/20/21 0551 06/21/21 0500 06/22/21 0605 06/23/21 0625 06/24/21 0849  WBC 21.8* 29.1* 27.4* 28.4* 23.6*    Bilateral lower leg edema and pain duplex ordered to rule out DVT  Goals of care: Discussed with patient, discussed  with oncology, oncology recommending home with hospice he has discussed this with his wife and the patient and both of them  are agreeable.  Pleural fluid cytology pending.  Patient is now DNR.   Overall prognosis poor to guarded DVT prophylaxis: enoxaparin (LOVENOX) injection 40 mg Start: 06/21/21 1000 SCDs Start: 06/20/21 0008 Code Status:   Code Status: DNR Family Communication: plan of care discussed with patient at bedside and with his wife on the phone 12/29 Status is: Inpatient Remains inpatient appropriate because: Ongoing management of pleural effusion orthostatic hypotension leukocytosis lactic acidosis Disposition: Currently not medically stable for discharge. Anticipated Disposition: Home with hospice likely tomorrow.  Increase ambulation today  Objective: Vitals last 24 hrs: Vitals:   06/23/21 1354 06/23/21 2208 06/23/21 2209 06/24/21 0435  BP: 95/68 101/71  127/85  Pulse: 77 (!) 36 85 82  Resp: 15 18  18   Temp: (!) 97.3 F (36.3 C) 98.8 F (37.1 C)  97.7 F (36.5 C)  TempSrc: Oral Oral  Oral  SpO2: 94% (!) 77% 94% 96%   Weight change:   Intake/Output Summary (Last 24 hours) at 06/24/2021 1043 Last data filed at 06/24/2021 0606 Gross per 24 hour  Intake 2645.31 ml  Output 350 ml  Net 2295.31 ml    Net IO Since Admission: 8,224.29 mL [06/24/21 1043]   Physical Examination: General exam: AAOx 3 older than stated age, weak appearing. HEENT:Oral mucosa moist, Ear/Nose WNL grossly, dentition normal. Respiratory system: bilaterally clear, no use of accessory muscle Cardiovascular system: S1 & S2 +, No JVD,. Gastrointestinal system: Abdomen soft,NT,ND, BS+ Nervous System:Alert, awake, moving extremities and grossly nonfocal Extremities: LE edema +, distal peripheral pulses palpable.  Skin: No rashes,no icterus. MSK: Normal muscle bulk,tone, power   Medications reviewed:  Scheduled Meds:  brimonidine  1 drop Right Eye BID   And   timolol  1 drop Right Eye BID   Chlorhexidine Gluconate Cloth  6 each Topical Daily   cycloSPORINE  1 drop Both Eyes BID   enoxaparin (LOVENOX) injection   40 mg Subcutaneous Q24H   erythromycin   Both Eyes Once   erythromycin   Right Eye Q6H   latanoprost  1 drop Right Eye QHS   levothyroxine  100 mcg Oral q morning   midodrine  5 mg Oral TID WC   multivitamin with minerals  1 tablet Oral Daily   Continuous Infusions:  ampicillin-sulbactam (UNASYN) IV 3 g (06/24/21 0830)   ceFEPime (MAXIPIME) IV     lactated ringers Stopped (06/24/21 0240)    Diet Order             Diet regular Room service appropriate? Yes; Fluid consistency: Thin  Diet effective now                  Weight change:   Wt Readings from Last 3 Encounters:  06/06/21 78.9 kg  05/24/21 79.7 kg  05/11/21 77.6 kg   Consultants:see note  Procedures:see note Antimicrobials: Anti-infectives (From admission, onward)    Start     Dose/Rate Route Frequency Ordered Stop   06/20/21 0200  Ampicillin-Sulbactam (UNASYN) 3 g in sodium chloride 0.9 % 100 mL IVPB        3 g 200 mL/hr over 30 Minutes Intravenous Every 6 hours 06/20/21 0022 06/25/21 0159   06/19/21 2200  vancomycin (VANCOREADY) IVPB 1750 mg/350 mL        1,750 mg 175 mL/hr over 120 Minutes Intravenous  Once 06/19/21 2127 06/20/21 0856  06/19/21 2130  vancomycin (VANCOCIN) IVPB 1000 mg/200 mL premix  Status:  Discontinued        1,000 mg 200 mL/hr over 60 Minutes Intravenous  Once 06/19/21 2119 06/19/21 2125   06/19/21 2130  ceFEPIme (MAXIPIME) 2 g in sodium chloride 0.9 % 100 mL IVPB        2 g 200 mL/hr over 30 Minutes Intravenous  Once 06/19/21 2119        Culture/Microbiology    Component Value Date/Time   SDES  06/20/2021 1428    PLEURAL Performed at [facility], [place] [address], [place], [place] [address]    SPECREQUEST  06/20/2021 1428    NONE Performed at [facility], [place] [address], [place], [place] [address]    CULT  06/20/2021 1428    NO GROWTH Performed at [facility], [place] [address] [place], [place] [address]    REPTSTATUS 06/24/2021  FINAL 06/20/2021 1428    Other culture-see note  Unresulted Labs (From admission, onward)     Start     Ordered   06/20/21 0108  Legionella Pneumophila Serogp 1 Ur Ag  Once,   R        06/20/21 0107   06/20/21 0107  Strep pneumoniae urinary antigen  Once,   R        06/20/21 0107           Data Reviewed: I have personally reviewed following labs and imaging studies CBC: Recent Labs  Lab 06/19/21 2005 06/20/21 0551 06/21/21 0500 06/22/21 0605 06/23/21 0625 06/24/21 0849  WBC 21.2* 21.8* 29.1* 27.4* 28.4* 23.6*  NEUTROABS 17.8*  --   --   --   --   --   HGB 10.0* 9.9* 10.3* 10.8* 10.5* 10.3*  HCT 30.0* 29.9* 31.4* 32.1* 31.3* 30.0*  MCV 100.0 101.4* 102.3* 100.6* 100.6* 100.3*  PLT 279 258 246 261 260 916    Basic Metabolic Panel: Recent Labs  Lab 06/19/21 2005 06/20/21 0551 06/21/21 0500 06/22/21 0605 06/23/21 0625  NA 134* 134* 133* 132* 132*  K 3.9 3.7 4.0 3.5 3.3*  CL 98 99 100 99 98  CO2 24 25 22 24 25   GLUCOSE 93 134* 129* 108* 111*  BUN 14 13 10 12 13   CREATININE 0.85 0.89 0.87 0.89 0.95  CALCIUM 8.5* 8.5* 7.7* 7.7* 7.7*  MG 1.8  --   --   --   --     GFR: CrCl cannot be calculated (Unknown ideal weight.). Liver Function Tests: Recent Labs  Lab 06/19/21 2005  AST 46*  ALT 21  ALKPHOS 440*  BILITOT 1.0  PROT 6.1*  ALBUMIN 2.5*    Recent Labs  Lab 06/20/21 1637  LIPASE 18  AMYLASE 84    No results for input(s): AMMONIA in the last 168 hours. Coagulation Profile: Recent Labs  Lab 06/19/21 2005  INR 1.1    Cardiac Enzymes: Recent Labs  Lab 06/19/21 2005  CKTOTAL 59    BNP (last 3 results) No results for input(s): PROBNP in the last 8760 hours. HbA1C: No results for input(s): HGBA1C in the last 72 hours. CBG: Recent Labs  Lab 06/21/21 0808  GLUCAP 117*    Lipid Profile: No results for input(s): CHOL, HDL, LDLCALC, TRIG, CHOLHDL, LDLDIRECT in the last 72 hours. Thyroid Function Tests: No results for input(s): TSH,  T4TOTAL, FREET4, T3FREE, THYROIDAB in the last 72 hours.  Anemia Panel: No results for input(s): VITAMINB12, FOLATE, FERRITIN,  TIBC, IRON, RETICCTPCT in the last 72 hours. Sepsis Labs: Recent Labs  Lab 06/20/21 1154 06/20/21 1525 06/21/21 0500 06/23/21 0844  LATICACIDVEN 4.1* 5.3* 6.3* 3.7*     Recent Results (from the past 240 hour(s))  Resp Panel by RT-PCR (Flu A&B, Covid) Nasopharyngeal Swab     Status: None   Collection Time: 06/19/21  8:05 PM   Specimen: Nasopharyngeal Swab; Nasopharyngeal(NP) swabs in vial transport medium  Result Value Ref Range Status   SARS Coronavirus 2 by RT PCR NEGATIVE NEGATIVE Final    Comment: (NOTE) SARS-CoV-2 target nucleic acids are NOT DETECTED.  The SARS-CoV-2 RNA is generally detectable in upper respiratory specimens during the acute phase of infection. The lowest concentration of SARS-CoV-2 viral copies this assay can detect is 138 copies/mL. A negative result does not preclude SARS-Cov-2 infection and should not be used as the sole basis for treatment or other patient management decisions. A negative result may occur with  improper specimen collection/handling, submission of specimen other than nasopharyngeal swab, presence of viral mutation(s) within the areas targeted by this assay, and inadequate number of viral copies(<138 copies/mL). A negative result must be combined with clinical observations, patient history, and epidemiological information. The expected result is Negative.  Fact Sheet for Patients:  EntrepreneurPulse.com.au  Fact Sheet for Healthcare Providers:  IncredibleEmployment.be  This test is no t yet approved or cleared by the [place] FDA and  has been authorized for detection and/or diagnosis of SARS-CoV-2 by FDA under an Emergency Use Authorization (EUA). This EUA will remain  in effect (meaning this test can be used) for the duration of the COVID-19 declaration under  Section 564(b)(1) of the Act, 21 U.S.C.section 360bbb-3(b)(1), unless the authorization is terminated  or revoked sooner.       Influenza A by PCR NEGATIVE NEGATIVE Final   Influenza B by PCR NEGATIVE NEGATIVE Final    Comment: (NOTE) The Xpert Xpress SARS-CoV-2/FLU/RSV plus assay is intended as an aid in the diagnosis of influenza from Nasopharyngeal swab specimens and should not be used as a sole basis for treatment. Nasal washings and aspirates are unacceptable for Xpert Xpress SARS-CoV-2/FLU/RSV testing.  Fact Sheet for Patients: EntrepreneurPulse.com.au  Fact Sheet for Healthcare Providers: IncredibleEmployment.be  This test is not yet approved or cleared by the [place] FDA and has been authorized for detection and/or diagnosis of SARS-CoV-2 by FDA under an Emergency Use Authorization (EUA). This EUA will remain in effect (meaning this test can be used) for the duration of the COVID-19 declaration under Section 564(b)(1) of the Act, 21 U.S.C. section 360bbb-3(b)(1), unless the authorization is terminated or revoked.  Performed at [facility], [place] [address] [place], [place] [address]   Blood Culture (routine x 2)     Status: None (Preliminary result)   Collection Time: 06/19/21 10:16 PM   Specimen: BLOOD  Result Value Ref Range Status   Specimen Description   Final    BLOOD RIGHT CHEST Performed at [place] [address], [place], [place] [address]    Special Requests   Final    Blood Culture adequate volume BOTTLES DRAWN AEROBIC AND ANAEROBIC Performed at [place] [address] [place], [place] [address]    Culture   Final    NO GROWTH 4 DAYS Performed at [facility], [place] [address] [place],  [address]    Report Status PENDING  Incomplete  Blood Culture (routine x 2)  Status: None (Preliminary result)   Collection Time: 06/19/21  10:16 PM   Specimen: BLOOD  Result Value Ref Range Status   Specimen Description   Final    BLOOD BLOOD RIGHT WRIST Performed at [place] [address] [place], [place] [address]    Special Requests   Final    Blood Culture results may not be optimal due to an inadequate volume of blood received in culture bottles BOTTLES DRAWN AEROBIC AND ANAEROBIC Performed at [facility], [place] [address], [place], [place] [address]    Culture   Final    NO GROWTH 4 DAYS Performed at [facility], [place] [address], [place], [place] [address]    Report Status PENDING  Incomplete  Body fluid culture w Gram Stain     Status: None   Collection Time: 06/20/21  2:28 PM   Specimen: PATH Cytology Pleural fluid  Result Value Ref Range Status   Specimen Description   Final    PLEURAL Performed at [place] [address], [place], [place] [address]    Special Requests   Final    NONE Performed at [facility], [place] [address], [place], [place] [address]    Gram Stain   Final    FEW WBC PRESENT, PREDOMINANTLY MONONUCLEAR NO ORGANISMS SEEN    Culture   Final    NO GROWTH Performed at [facility], [place] [address] [place],  [address]    Report Status 06/24/2021 FINAL  Final      Radiology Studies: No results found.   LOS: 5 days   Antonieta Pert, MD [facility]  06/24/2021, 10:43 AM

## 2021-06-25 DIAGNOSIS — J90 Pleural effusion, not elsewhere classified: Secondary | ICD-10-CM | POA: Diagnosis not present

## 2021-06-25 MED ORDER — MIDODRINE HCL 5 MG PO TABS
10.0000 mg | ORAL_TABLET | Freq: Three times a day (TID) | ORAL | Status: DC
  Administered 2021-06-25 – 2021-06-29 (×10): 10 mg via ORAL
  Filled 2021-06-25 (×15): qty 2

## 2021-06-25 MED ORDER — LEVOTHYROXINE SODIUM 25 MCG PO TABS
125.0000 ug | ORAL_TABLET | Freq: Every morning | ORAL | Status: DC
  Administered 2021-06-26 – 2021-06-29 (×4): 125 ug via ORAL
  Filled 2021-06-25 (×4): qty 1

## 2021-06-25 NOTE — Progress Notes (Signed)
PROGRESS NOTE    Erik Fletcher.  XYI:[id number] DOB: 1951-05-30 DOA: 06/19/2021 PCP: Lawerance Cruel, MD   Chief Complaint  Patient presents with   Fall  Brief Narrative/Hospital Course: Erik Fletcher., 71 y.o. 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  On 12/26 he was walking to the bathroom and suddenly had loss of consciousness and fell backwards and was having back pain.  Has been having syncopal episodes with falls several times a month seen by oncology felt to be orthostatic hypotension and was started on midodrine in November and also had his cortisol checked on 11/11 that was up at 28.  Patient denies any chest pain palpitation shortness of breath nausea vomiting.  In the ED hemodynamically stable. WBC of 21K, hemoglobin of 10,lactate of 4. Sodium of 134, K of 3.9, creatinine was 0.85, BG of 93 CT head and cervical spine was negative for acute findings. CT chest shows moderate-sized left pleural effusion with compressive atelectasis.  There is also debris versus ingested material within the upper esophagus concerning for aspiration. Lumbar spine and pelvis was negative.   Subjective: Seen examined this morning.   Per nursing yesterday nursing tried to get him up to the bedside, but very weak Blood pressure in 89 this morning but chronic disease  Assessment & Plan:  Recurrent syncope with loss of consciousness and falls: Cortisol level was up, at 28 In November-felt to be orthostatic and was placed on midodrine in November by his oncology.  Can increase to 10 mg 3 times, compression stockings, consider abdominal binder.PT OT eval and check orthostatic vitals today.  His wife also has cancer and 2 daughters have been managing at home they prefer him to come home.  Patient has had multiple fluid boluses echocardiogram with preserved EF G1  DD.  Metastatic cholangiocarcinoma on chemotherapy by Dr. Learta Codding.  CT chest showed hypodense lesions/masses throughout the liver consistent with hepatic metastasis scattered areas of free fluid/ascites in the upper abdomen.Discussed with Dr. Learta Codding and unfortunately will not be well-tolerated further therapy and plan is for home with hospice.    Acute metabolic encephalopathy/hospital-acquired delirium in the setting of his metastatic cancer, hospitalization. improved.  Left-sided pleural effusion seen on the CT chest:In the setting of immunocompromise status placed on IV Unasyn-leukocytosis downtrending, completed antibiotics.  Wondering if pleural effusion is malignant but cytology negative and ultrasound thoracentesis-s/p US thoracentesis  325 ml fluid removed-post procedure chest x-ray with no pneumothorax and no significant pleural effusion 12/28,Culture -gram stain no growth.   Mild left hand cellulitis patient is now on Unasyn .he had dog scratch.  Received tetanus booster in the ED. complete antibiotics  Lactic acidosis: Unclear etiology could be multifactorial was on metformin at home now here with pleural effusion, has  metastatic cholangiocarcinoma,-causing delay in clearance-  was discussed with PCCM, could be due to high tumor burden, patient was given multiple boluses, remains hemodynamically stable, afebrile.  Amylase lipase normal troponin negative-repeat lactic acidosis improving indicating slow clearance Recent Labs  Lab 06/20/21 1525 06/21/21 0500 06/23/21 0844  LATICACIDVEN 5.3* 6.3* 3.7*    Right bacterial conjunctivitis on erythromycin ointment 3 times daily  Hypothyroidism with TSH 39 Free T4 also low Synthroid dose increased  Leukocytosis:Count was WBC 1K on 12/14 on admission 21.2K and uptrended to 29k and now level of fluctuating remains elevated-but improving.  Afebrile. Recent Labs  Lab 06/20/21 0551 06/21/21 0500 06/22/21  [id number] 06/23/21 0625 06/24/21 0849   WBC 21.8* 29.1* 27.4* 28.4* 23.6*   Lower leg edema-negative for DVT in duplex.   Goals of care: Discussed with patient, discussed with oncology, oncology recommending home with hospice-discussed this with his wife and the patient and both of them are agreeable.  Pleural fluid cytology pending.  Patient is now DNR.   Overall prognosis poor to guarded. Discussed with patient's daughter today, they prefer him to come home.  They would like to see if he is able to get up.  DVT prophylaxis: enoxaparin (LOVENOX) injection 40 mg Start: 06/21/21 1000 SCDs Start: 06/20/21 0008 Code Status:   Code Status: DNR Family Communication: plan of care discussed with patient at bedside and with his wife on the phone 12/29.  Discussed with daughter today.  Status is: Inpatient Remains inpatient appropriate because: Ongoing management of pleural effusion orthostatic hypotension leukocytosis lactic acidosis Disposition: Currently not medically stable for discharge. Anticipated Disposition: Home with hospice.  Objective: Vitals last 24 hrs: Vitals:   06/24/21 1424 06/24/21 2144 06/25/21 0539 06/25/21 1403  BP: 111/83 (!) 133/97 (!) 89/74 112/76  Pulse: (!) 108 (!) 110 88 (!) 102  Resp: 19 17 17 18   Temp: 98 F (36.7 C) 98.1 F (36.7 C) 97.9 F (36.6 C) 97.7 F (36.5 C)  TempSrc: Oral Oral Oral Oral  SpO2: 95% 93% 93% 94%   Weight change:   Intake/Output Summary (Last 24 hours) at 06/25/2021 1408 Last data filed at 06/25/2021 1100 Gross per 24 hour  Intake 573 ml  Output 1350 ml  Net -777 ml   Net IO Since Admission: 7,447.29 mL [06/25/21 1408]   Physical Examination: General exam: AAOx3, weak obese, not in distress.   HEENT:Oral mucosa moist, Ear/Nose WNL grossly, dentition normal. Respiratory system: bilaterally diminished at the base,no use of accessory muscle Cardiovascular system: S1 & S2 +, No JVD,. Gastrointestinal system: Abdomen soft, NT,ND, BS+ Nervous System:Alert, awake, moving  extremities and grossly nonfocal Extremities: mild edema, distal peripheral pulses palpable.  Skin: No rashes,no icterus. MSK: Normal muscle bulk,tone, power  Medications reviewed:  Scheduled Meds:  brimonidine  1 drop Right Eye BID   And   timolol  1 drop Right Eye BID   Chlorhexidine Gluconate Cloth  6 each Topical Daily   cycloSPORINE  1 drop Both Eyes BID   enoxaparin (LOVENOX) injection  40 mg Subcutaneous Q24H   erythromycin   Right Eye Q6H   latanoprost  1 drop Right Eye QHS   [START ON 06/26/2021] levothyroxine  125 mcg Oral q morning   midodrine  10 mg Oral TID WC   multivitamin with minerals  1 tablet Oral Daily   Continuous Infusions:  lactated ringers 125 mL/hr at 06/24/21 2024    Diet Order             Diet regular Room service appropriate? Yes; Fluid consistency: Thin  Diet effective now                  Weight change:   Wt Readings from Last 3 Encounters:  06/06/21 78.9 kg  05/24/21 79.7 kg  05/11/21 77.6 kg   Consultants:see note  Procedures:see note Antimicrobials: Anti-infectives (From admission, onward)    Start     Dose/Rate Route Frequency Ordered Stop   06/20/21 0200  Ampicillin-Sulbactam (UNASYN) 3 g in sodium chloride 0.9 % 100 mL IVPB        3 g 200 mL/hr over 30 Minutes Intravenous Every 6 hours  06/20/21 0022 06/24/21 2057   06/19/21 2200  vancomycin (VANCOREADY) IVPB 1750 mg/350 mL        1,750 mg 175 mL/hr over 120 Minutes Intravenous  Once 06/19/21 2127 06/20/21 0856   06/19/21 2130  vancomycin (VANCOCIN) IVPB 1000 mg/200 mL premix  Status:  Discontinued        1,000 mg 200 mL/hr over 60 Minutes Intravenous  Once 06/19/21 2119 06/19/21 2125   06/19/21 2130  ceFEPIme (MAXIPIME) 2 g in sodium chloride 0.9 % 100 mL IVPB  Status:  Discontinued        2 g 200 mL/hr over 30 Minutes Intravenous  Once 06/19/21 2119 06/25/21 0917      Culture/Microbiology    Component Value Date/Time   SDES  06/20/2021 1428    PLEURAL Performed at  [facility], [place] [address], [place], [place] [address]    SPECREQUEST  06/20/2021 1428    NONE Performed at [facility], [place] [address], [place], [place] [address]    CULT  06/20/2021 1428    NO GROWTH Performed at [facility], [place] [address] [place], [place] [address]    REPTSTATUS 06/24/2021 FINAL 06/20/2021 1428    Other culture-see note  Unresulted Labs (From admission, onward)     Start     Ordered   06/20/21 0108  Legionella Pneumophila Serogp 1 Ur Ag  Once,   R        06/20/21 0107   06/20/21 0107  Strep pneumoniae urinary antigen  Once,   R        06/20/21 0107           Data Reviewed: I have personally reviewed following labs and imaging studies CBC: Recent Labs  Lab 06/19/21 2005 06/20/21 0551 06/21/21 0500 06/22/21 0605 06/23/21 0625 06/24/21 0849  WBC 21.2* 21.8* 29.1* 27.4* 28.4* 23.6*  NEUTROABS 17.8*  --   --   --   --   --   HGB 10.0* 9.9* 10.3* 10.8* 10.5* 10.3*  HCT 30.0* 29.9* 31.4* 32.1* 31.3* 30.0*  MCV 100.0 101.4* 102.3* 100.6* 100.6* 100.3*  PLT 279 258 246 261 260 361   Basic Metabolic Panel: Recent Labs  Lab 06/19/21 2005 06/20/21 0551 06/21/21 0500 06/22/21 0605 06/23/21 0625  NA 134* 134* 133* 132* 132*  K 3.9 3.7 4.0 3.5 3.3*  CL 98 99 100 99 98  CO2 24 25 22 24 25   GLUCOSE 93 134* 129* 108* 111*  BUN 14 13 10 12 13   CREATININE 0.85 0.89 0.87 0.89 0.95  CALCIUM 8.5* 8.5* 7.7* 7.7* 7.7*  MG 1.8  --   --   --   --    GFR: CrCl cannot be calculated (Unknown ideal weight.). Liver Function Tests: Recent Labs  Lab 06/19/21 2005  AST 46*  ALT 21  ALKPHOS 440*  BILITOT 1.0  PROT 6.1*  ALBUMIN 2.5*   Recent Labs  Lab 06/20/21 1637  LIPASE 18  AMYLASE 84   No results for input(s): AMMONIA in the last 168 hours. Coagulation Profile: Recent Labs  Lab 06/19/21 2005  INR 1.1   Cardiac Enzymes: Recent Labs  Lab 06/19/21 2005  CKTOTAL 59   BNP (last 3 results) No  results for input(s): PROBNP in the last 8760 hours. HbA1C: No results for input(s): HGBA1C in the last 72 hours. CBG: Recent Labs  Lab 06/21/21 0808  GLUCAP 117*   Lipid Profile: No results for input(s): CHOL, HDL, LDLCALC,  TRIG, CHOLHDL, LDLDIRECT in the last 72 hours. Thyroid Function Tests: No results for input(s): TSH, T4TOTAL, FREET4, T3FREE, THYROIDAB in the last 72 hours.  Anemia Panel: No results for input(s): VITAMINB12, FOLATE, FERRITIN, TIBC, IRON, RETICCTPCT in the last 72 hours. Sepsis Labs: Recent Labs  Lab 06/20/21 1154 06/20/21 1525 06/21/21 0500 06/23/21 0844  LATICACIDVEN 4.1* 5.3* 6.3* 3.7*    Recent Results (from the past 240 hour(s))  Resp Panel by RT-PCR (Flu A&B, Covid) Nasopharyngeal Swab     Status: None   Collection Time: 06/19/21  8:05 PM   Specimen: Nasopharyngeal Swab; Nasopharyngeal(NP) swabs in vial transport medium  Result Value Ref Range Status   SARS Coronavirus 2 by RT PCR NEGATIVE NEGATIVE Final    Comment: (NOTE) SARS-CoV-2 target nucleic acids are NOT DETECTED.  The SARS-CoV-2 RNA is generally detectable in upper respiratory specimens during the acute phase of infection. The lowest concentration of SARS-CoV-2 viral copies this assay can detect is 138 copies/mL. A negative result does not preclude SARS-Cov-2 infection and should not be used as the sole basis for treatment or other patient management decisions. A negative result may occur with  improper specimen collection/handling, submission of specimen other than nasopharyngeal swab, presence of viral mutation(s) within the areas targeted by this assay, and inadequate number of viral copies(<138 copies/mL). A negative result must be combined with clinical observations, patient history, and epidemiological information. The expected result is Negative.  Fact Sheet for Patients:  EntrepreneurPulse.com.au  Fact Sheet for Healthcare Providers:   IncredibleEmployment.be  This test is no t yet approved or cleared by the [place] FDA and  has been authorized for detection and/or diagnosis of SARS-CoV-2 by FDA under an Emergency Use Authorization (EUA). This EUA will remain  in effect (meaning this test can be used) for the duration of the COVID-19 declaration under Section 564(b)(1) of the Act, 21 U.S.C.section 360bbb-3(b)(1), unless the authorization is terminated  or revoked sooner.       Influenza A by PCR NEGATIVE NEGATIVE Final   Influenza B by PCR NEGATIVE NEGATIVE Final    Comment: (NOTE) The Xpert Xpress SARS-CoV-2/FLU/RSV plus assay is intended as an aid in the diagnosis of influenza from Nasopharyngeal swab specimens and should not be used as a sole basis for treatment. Nasal washings and aspirates are unacceptable for Xpert Xpress SARS-CoV-2/FLU/RSV testing.  Fact Sheet for Patients: EntrepreneurPulse.com.au  Fact Sheet for Healthcare Providers: IncredibleEmployment.be  This test is not yet approved or cleared by the [place] FDA and has been authorized for detection and/or diagnosis of SARS-CoV-2 by FDA under an Emergency Use Authorization (EUA). This EUA will remain in effect (meaning this test can be used) for the duration of the COVID-19 declaration under Section 564(b)(1) of the Act, 21 U.S.C. section 360bbb-3(b)(1), unless the authorization is terminated or revoked.  Performed at [facility], [place] [address] [place], [place] [address]   Blood Culture (routine x 2)     Status: None   Collection Time: 06/19/21 10:16 PM   Specimen: BLOOD  Result Value Ref Range Status   Specimen Description   Final    BLOOD RIGHT CHEST Performed at [place] [address], [place], [place] [address]    Special Requests   Final    Blood Culture adequate volume BOTTLES DRAWN AEROBIC AND ANAEROBIC Performed at  [place] [address] [place], [place] [address]    Culture   Final    NO GROWTH  5 DAYS Performed at [facility], [place] [address], [place], [place] [address]    Report Status 06/24/2021 FINAL  Final  Blood Culture (routine x 2)     Status: None   Collection Time: 06/19/21 10:16 PM   Specimen: BLOOD  Result Value Ref Range Status   Specimen Description   Final    BLOOD BLOOD RIGHT WRIST Performed at [place] [address] [place], [place] [address]    Special Requests   Final    Blood Culture results may not be optimal due to an inadequate volume of blood received in culture bottles BOTTLES DRAWN AEROBIC AND ANAEROBIC Performed at [facility], [place] [address], [place], [place] [address]    Culture   Final    NO GROWTH 5 DAYS Performed at [facility], [place] [address] [place], [place] [address]    Report Status 06/24/2021 FINAL  Final  Body fluid culture w Gram Stain     Status: None   Collection Time: 06/20/21  2:28 PM   Specimen: PATH Cytology Pleural fluid  Result Value Ref Range Status   Specimen Description   Final    PLEURAL Performed at [place] [address], [place], [place] [address]    Special Requests   Final    NONE Performed at [facility], [place] [address] [place], [place] [address]    Gram Stain   Final    FEW WBC PRESENT, PREDOMINANTLY MONONUCLEAR NO ORGANISMS SEEN    Culture   Final    NO GROWTH Performed at [facility], [place] [address], [place], [place] [address]    Report Status 06/24/2021 FINAL  Final      Radiology Studies: VAS [place] LOWER EXTREMITY VENOUS (DVT)  Result Date: 06/24/2021  Lower Venous DVT Study Patient Name:  Sarp Vernier.  Date of Exam:   06/24/2021 Medical Rec #: [id number]                Accession #:    [id number] Date of Birth: 05/10/1951               Patient Gender: M Patient Age:   27  years Exam Location:  [facility] Procedure:      VAS [place] LOWER EXTREMITY VENOUS (DVT) Referring Phys: Betsy Coder --------------------------------------------------------------------------------  Indications: Swelling and tenderness LT>RT.  Comparison Study: 05-24-2021 Prior bilateral lower extremity venous was negative                   for DVT. Performing Technologist: Darlin Coco RDMS, RVT  Examination Guidelines: A complete evaluation includes B-mode imaging, spectral Doppler, color Doppler, and power Doppler as needed of all accessible portions of each vessel. Bilateral testing is considered an integral part of a complete examination. Limited examinations for reoccurring indications may be performed as noted. The reflux portion of the exam is performed with the patient in reverse Trendelenburg.  +-----+---------------+---------+-----------+----------+--------------+  RIGHT Compressibility Phasicity Spontaneity Properties Thrombus Aging  +-----+---------------+---------+-----------+----------+--------------+  CFV   Full            Yes       Yes                                    +-----+---------------+---------+-----------+----------+--------------+   +---------+---------------+---------+-----------+----------+--------------+  LEFT      Compressibility Phasicity Spontaneity Properties  Thrombus Aging  +---------+---------------+---------+-----------+----------+--------------+  CFV       Full            Yes       Yes                                    +---------+---------------+---------+-----------+----------+--------------+  SFJ       Full                                                             +---------+---------------+---------+-----------+----------+--------------+  FV Prox   Full                                                             +---------+---------------+---------+-----------+----------+--------------+  FV Mid    Full                                                              +---------+---------------+---------+-----------+----------+--------------+  FV Distal Full                                                             +---------+---------------+---------+-----------+----------+--------------+  PFV       Full                                                             +---------+---------------+---------+-----------+----------+--------------+  POP       Full            Yes       Yes                                    +---------+---------------+---------+-----------+----------+--------------+  PTV       Full                                                             +---------+---------------+---------+-----------+----------+--------------+  PERO      Full                                                             +---------+---------------+---------+-----------+----------+--------------+  Summary: RIGHT: - No evidence of common femoral vein obstruction.  LEFT: - There is no evidence of deep vein thrombosis in the lower extremity.  - No cystic structure found in the popliteal fossa.  *See table(s) above for measurements and observations. Electronically signed by Monica Martinez MD on 06/24/2021 at 4:47:57 PM.    Final      LOS: 6 days   Antonieta Pert, MD [facility]  06/25/2021, 2:08 PM

## 2021-06-25 NOTE — Progress Notes (Signed)
[id number]) Hospital Liaison Note   Plan remains for patient to discharge home with hospice services once stable for discharge. Per MD, PT to work with patient.   Hospital liaison will continue to follow for discharge disposition.   Please send signed and completed DNR home with patient/family. Please provide prescriptions at discharge as needed to ensure ongoing symptom management.    Please do not hesitate to call with any hospice related questions or concerns.    Thank you for the opportunity to participate in this patient's care.  Daphene Calamity, MSW  [facility] Liaison [phone]

## 2021-06-25 NOTE — Progress Notes (Addendum)
IP PROGRESS NOTE  Subjective:   Mr. Schupp reports no pain or nausea.  He says he has not been out of bed.  He is agreeable to discharge to home with hospice care if his wife is okay with this.  Objective: Vital signs in last 24 hours: Blood pressure (!) 89/74, pulse 88, temperature 97.9 F (36.6 C), temperature source Oral, resp. rate 17, SpO2 93 %.  Intake/Output from previous day: 01/01 0701 - 01/02 0700 In: -  Out: 950 [Urine:950]  Physical Exam:  HEENT: No thrush or ulcers Lungs: Distant breath sounds, no respiratory distress Cardiac: Regular rate and rhythm Abdomen: The liver edge is palpable in the subxiphoid region, nontender, no splenomegaly Extremities: Trace edema throughout the left leg Neurologic: Follows commands, oriented to place and diagnosis  Portacath/PICC-without erythema  Lab Results: Recent Labs    06/23/21 0625 06/24/21 0849  WBC 28.4* 23.6*  HGB 10.5* 10.3*  HCT 31.3* 30.0*  PLT 260 249    BMET Recent Labs    06/23/21 0625  NA 132*  K 3.3*  CL 98  CO2 25  GLUCOSE 111*  BUN 13  CREATININE 0.95  CALCIUM 7.7*    Lab Results  Component Value Date   CEA 54.5 (H) 12/01/2020   BJS283 97,813 (H) 06/06/2021    Studies/Results: VAS [place] LOWER EXTREMITY VENOUS (DVT)  Result Date: 06/24/2021  Lower Venous DVT Study Patient Name:  Mardell Cragg.  Date of Exam:   06/24/2021 Medical Rec #: [id number]                Accession #:    [id number] Date of Birth: 1950/12/23               Patient Gender: M Patient Age:   71 years Exam Location:  [facility] Procedure:      VAS [place] LOWER EXTREMITY VENOUS (DVT) Referring Phys: Betsy Coder --------------------------------------------------------------------------------  Indications: Swelling and tenderness LT>RT.  Comparison Study: 05-24-2021 Prior bilateral lower extremity venous was negative                   for DVT. Performing Technologist: Darlin Coco RDMS, RVT  Examination Guidelines:  A complete evaluation includes B-mode imaging, spectral Doppler, color Doppler, and power Doppler as needed of all accessible portions of each vessel. Bilateral testing is considered an integral part of a complete examination. Limited examinations for reoccurring indications may be performed as noted. The reflux portion of the exam is performed with the patient in reverse Trendelenburg.  +-----+---------------+---------+-----------+----------+--------------+  RIGHT Compressibility Phasicity Spontaneity Properties Thrombus Aging  +-----+---------------+---------+-----------+----------+--------------+  CFV   Full            Yes       Yes                                    +-----+---------------+---------+-----------+----------+--------------+   +---------+---------------+---------+-----------+----------+--------------+  LEFT      Compressibility Phasicity Spontaneity Properties Thrombus Aging  +---------+---------------+---------+-----------+----------+--------------+  CFV       Full            Yes       Yes                                    +---------+---------------+---------+-----------+----------+--------------+  SFJ       Full                                                             +---------+---------------+---------+-----------+----------+--------------+  FV Prox   Full                                                             +---------+---------------+---------+-----------+----------+--------------+  FV Mid    Full                                                             +---------+---------------+---------+-----------+----------+--------------+  FV Distal Full                                                             +---------+---------------+---------+-----------+----------+--------------+  PFV       Full                                                             +---------+---------------+---------+-----------+----------+--------------+  POP       Full            Yes       Yes                                     +---------+---------------+---------+-----------+----------+--------------+  PTV       Full                                                             +---------+---------------+---------+-----------+----------+--------------+  PERO      Full                                                             +---------+---------------+---------+-----------+----------+--------------+    Summary: RIGHT: - No evidence of common femoral vein obstruction.  LEFT: - There is no evidence of deep vein thrombosis in the lower extremity.  - No cystic structure found in the popliteal fossa.  *See table(s) above for measurements and observations. Electronically signed by Monica Martinez MD on 06/24/2021 at 4:47:57 PM.    Final     Medications: I have reviewed the patient's current medications.  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 Cycle 1 FOLFOX 03/06/2021 Cycle 2 FOLFOX 03/21/2021 Cycle 3 FOLFOX 05/24/2021 Glaucoma Anorexia/weight loss secondary to #1 Pain secondary to #1 Port-A-Cath placement 12/27/2020 TSH markedly elevated 05/04/2021-Euthyrox increased from 50 mcg to 100 mcg Severe neutropenia following cycle 3 FOLFOX, chemotherapy held 06/06/2021, Udenyca to be added with cycle 4 Admission 06/19/2021 following a fall New left effusion on chest CT 06/19/2021 Altered mental status    Erik Fletcher appears unchanged.  The left leg Doppler was negative for DVT.    He agrees to discharge to home with hospice care.  I attempted to contact his wife by telephone this morning and was unable to reach her. Recommendations: 1.  Discharge to home with hospice care 2.  We will increase the thyroid hormone dose due to the persistently elevated TSH 3.  Outpatient follow-up will be scheduled at the Cancer center    LOS: 6 days   Betsy Coder, MD   06/25/2021, 6:42 AM

## 2021-06-25 NOTE — Evaluation (Signed)
Physical Therapy Evaluation Patient Details Name: Erik Fletcher. MRN: [id number] DOB: [birth year] Today's Date: 06/25/2021  History of Present Illness  71 yo male admitted with syncope, fall, back pain. Hx of met cholangiocarcinoma-on chemo, DM, glaucoma, lumbar fusion 2017  Clinical Impression  On eval, pt required +2 assist for mobility. He was only able to briefly sit EOB with Min-Mod A for static sitting balance. Posterior lean with repeated LOB without external assist/support. Pt c/o significant neck and back pain with mobility on today-pt rated 10/10. Unable to get orthostatic BP due to poor sitting balance and need for +2 assist. High fall risk. Pt is very weak. No family present during session. Will likely require PTAR transport home.        Recommendations for follow up therapy are one component of a multi-disciplinary discharge planning process, led by the attending physician.  Recommendations may be updated based on patient status, additional functional criteria and insurance authorization.  Follow Up Recommendations Skilled nursing-short term rehab (<3 hours/day) (vs return home with hospice)    Assistance Recommended at Discharge Frequent or constant Supervision/Assistance  Functional Status Assessment    Equipment Recommendations  Hospital bed;Wheelchair;Wheelchair cushion ;BSC/3in1    Recommendations for Other Services OT consult     Precautions / Restrictions Precautions Precautions: Fall Precaution Comments: neck and back pain Restrictions Weight Bearing Restrictions: No      Mobility  Bed Mobility Overal bed mobility: Needs Assistance Bed Mobility: Rolling;Sidelying to Sit;Sit to Supine Rolling: Mod assist Sidelying to sit: Max assist   Sit to supine: Mod assist;+2 for physical assistance;+2 for safety/equipment   General bed mobility comments: Assist for trunk and bil LEs. Utilized bedpad to aid with supporting trunk while getting to EOB. Increased  time. Cues required. Utilized bedrail as able. Significant neck and back pain with bed mobility    Transfers                   General transfer comment: Unable to safely attempt. Pt had significant difficulty maintain static sitting balance.    Ambulation/Gait                  Stairs            Wheelchair Mobility    Modified Rankin (Stroke Patients Only)       Balance Overall balance assessment: Needs assistance Sitting-balance support: Bilateral upper extremity supported;Feet supported Sitting balance-Leahy Scale: Poor Sitting balance - Comments: Briefly sat EOB. Attempted to work on anterior weightshifting 2* posterior lean. Pt relied heavily on holding onto bed framing/footboard to maintain static sitting balance.                                     Pertinent Vitals/Pain Pain Assessment: 0-10 Pain Score: 10-Worst pain ever Pain Location: neck and back with movement Pain Descriptors / Indicators: Grimacing;Guarding;Moaning Pain Intervention(s): Limited activity within patient's tolerance;Monitored during session;Repositioned    Home Living Family/patient expects to be discharged to:: Private residence Living Arrangements: Spouse/significant other Available Help at Discharge: Family Type of Home: House Home Access: Stairs to enter Entrance Stairs-Rails: Right Entrance Stairs-Number of Steps: 5   Home Layout: One level Home Equipment: Conservation officer, nature (2 wheels)      Prior Function               Mobility Comments: using RW for ambulation       Hand Dominance  Extremity/Trunk Assessment   Upper Extremity Assessment Upper Extremity Assessment: Defer to OT evaluation    Lower Extremity Assessment Lower Extremity Assessment: Generalized weakness       Communication   Communication: No difficulties  Cognition Arousal/Alertness: Awake/alert Behavior During Therapy: WFL for tasks assessed/performed Overall  Cognitive Status: No family/caregiver present to determine baseline cognitive functioning                                 General Comments: at times, seems to take a long time to respond. Also seemed to have some trouble with planning.         General Comments      Exercises     Assessment/Plan    PT Assessment Patient needs continued PT services  PT Problem List Decreased strength;Decreased mobility;Decreased range of motion;Decreased activity tolerance;Decreased balance;Decreased knowledge of use of DME;Pain;Decreased cognition       PT Treatment Interventions DME instruction;Gait training;Therapeutic activities;Therapeutic exercise;Patient/family education;Balance training;Functional mobility training    PT Goals (Current goals can be found in the Care Plan section)  Acute Rehab PT Goals Patient Stated Goal: less pain. per chart, to return home? PT Goal Formulation: With patient Time For Goal Achievement: 07/09/21 Potential to Achieve Goals: Fair    Frequency Min 3X/week   Barriers to discharge        Co-evaluation               AM-PAC PT "6 Clicks" Mobility  Outcome Measure Help needed turning from your back to your side while in a flat bed without using bedrails?: Total Help needed moving from lying on your back to sitting on the side of a flat bed without using bedrails?: Total Help needed moving to and from a bed to a chair (including a wheelchair)?: Total Help needed standing up from a chair using your arms (e.g., wheelchair or bedside chair)?: Total Help needed to walk in hospital room?: Total Help needed climbing 3-5 steps with a railing? : Total 6 Click Score: 6    End of Session   Activity Tolerance: Patient limited by pain;Patient limited by fatigue Patient left: in bed;with call bell/phone within reach;with bed alarm set   PT Visit Diagnosis: Muscle weakness (generalized) (M62.81);Difficulty in walking, not elsewhere classified  (R26.2);Pain;Other abnormalities of gait and mobility (R26.89);History of falling (Z91.81) Pain - part of body:  (neck/back)    Time: 1436-1510 PT Time Calculation (min) (ACUTE ONLY): 34 min   Charges:   PT Evaluation $PT Eval Moderate Complexity: 1 Mod PT Treatments $Therapeutic Activity: 8-22 mins          Doreatha Massed, PT Acute Rehabilitation  Office: [phone] Pager: [phone]

## 2021-06-25 NOTE — Progress Notes (Signed)
Palliative Care Brief Note  Consult received. Case discussed with Dr. Lupita Leash and plan for discharge home with hospice support when medically ready.  No palliative specific needs at this point as goals are clear.  Will hold on formal consult.   Please call or reconsult if we can be of further assistance in the care of Erik Fletcher moving forward.  Micheline Rough, MD [place] Palliative Medicine Team [phone]  NO CHARGE NOTE

## 2021-06-26 LAB — CBC
HCT: 32.8 % — ABNORMAL LOW (ref 39.0–52.0)
Hemoglobin: 10.7 g/dL — ABNORMAL LOW (ref 13.0–17.0)
MCH: 33.1 pg (ref 26.0–34.0)
MCHC: 32.6 g/dL (ref 30.0–36.0)
MCV: 101.5 fL — ABNORMAL HIGH (ref 80.0–100.0)
Platelets: 270 10*3/uL (ref 150–400)
RBC: 3.23 MIL/uL — ABNORMAL LOW (ref 4.22–5.81)
RDW: 15.9 % — ABNORMAL HIGH (ref 11.5–15.5)
WBC: 27.9 10*3/uL — ABNORMAL HIGH (ref 4.0–10.5)
nRBC: 0 % (ref 0.0–0.2)

## 2021-06-26 LAB — BASIC METABOLIC PANEL
Anion gap: 14 (ref 5–15)
BUN: 13 mg/dL (ref 8–23)
CO2: 25 mmol/L (ref 22–32)
Calcium: 8.3 mg/dL — ABNORMAL LOW (ref 8.9–10.3)
Chloride: 98 mmol/L (ref 98–111)
Creatinine, Ser: 0.76 mg/dL (ref 0.61–1.24)
GFR, Estimated: >60 mL/min (ref >60)
Glucose, Bld: 119 mg/dL — ABNORMAL HIGH (ref 70–99)
Potassium: 3.8 mmol/L (ref 3.5–5.1)
Sodium: 137 mmol/L (ref 135–145)

## 2021-06-26 NOTE — Evaluation (Signed)
Occupational Therapy Evaluation Patient Details Name: Erik Fletcher. MRN: [id number] DOB: December 29, 1950 Today's Date: 06/26/2021   History of Present Illness 70 yo male admitted with syncope, fall, back pain. Hx of met cholangiocarcinoma-on chemo, DM, glaucoma, lumbar fusion 2017   Clinical Impression   Patient is a confused 71 year old male who was admitted for above. Patient was unable to provide PLOF, PLOF was taken from prior hospitalization. Patient was max A for rolling in bed, and max A for all ADL tasks. Patient was noted to have increased confusion and agitation with tasks impacting participation in evaluation. Patient would continue to benefit from skilled OT services at this time while admitted and after d/c to address noted deficits in order to improve overall safety and independence in ADLs.        Recommendations for follow up therapy are one component of a multi-disciplinary discharge planning process, led by the attending physician.  Recommendations may be updated based on patient status, additional functional criteria and insurance authorization.   Follow Up Recommendations  Skilled nursing-short term rehab (<3 hours/day)    Assistance Recommended at Discharge Frequent or constant Supervision/Assistance  Patient can return home with the following Two people to help with bathing/dressing/bathroom    Functional Status Assessment  Patient has had a recent decline in their functional status and demonstrates the ability to make significant improvements in function in a reasonable and predictable amount of time.  Equipment Recommendations  Other (comment) (defer to next venue)    Recommendations for Other Services       Precautions / Restrictions Precautions Precautions: Fall Precaution Comments: neck and back pain Restrictions Weight Bearing Restrictions: No      Mobility Bed Mobility Overal bed mobility: Needs Assistance Bed Mobility: Rolling Rolling: Max  assist         General bed mobility comments: with education on proper log rolling to reduce pressure on back. patient declined sitting on edge of bed on this date.    Transfers                          Balance                                           ADL either performed or assessed with clinical judgement   ADL Overall ADL's : Needs assistance/impaired Eating/Feeding: Set up;Bed level Eating/Feeding Details (indicate cue type and reason): sitting in bed Grooming: Wash/dry face;Moderate assistance;Bed level Grooming Details (indicate cue type and reason): patient reported " i dont know how" holding hand in air. patient provided hand over hand to make contact with face with patient able to wipe chin. patient was noted to have poor particiaption with patient noted to ask " why am i doing this?" multiple times during session. nursing consulted over patients participation. Upper Body Bathing: Maximal assistance;Bed level   Lower Body Bathing: Bed level;Total assistance   Upper Body Dressing : Maximal assistance;Bed level   Lower Body Dressing: Bed level;Total assistance   Toilet Transfer: +2 for physical assistance;+2 for safety/equipment   Toileting- Clothing Manipulation and Hygiene: Bed level;Total assistance       Functional mobility during ADLs: +2 for physical assistance;+2 for safety/equipment       Vision Patient Visual Report: No change from baseline       Perception  Praxis      Pertinent Vitals/Pain       Hand Dominance Left   Extremity/Trunk Assessment Upper Extremity Assessment Upper Extremity Assessment: Difficult to assess due to impaired cognition (patient was [facility] for ROM of BUE with decreased motivation to participate in MMT.)   Lower Extremity Assessment Lower Extremity Assessment: Defer to PT evaluation       Communication Communication Communication: No difficulties   Cognition                                              General Comments       Exercises     Shoulder Instructions      Home Living Family/patient expects to be discharged to:: Private residence Living Arrangements: Spouse/significant other   Type of Home: House Home Access: Stairs to enter Technical brewer of Steps: 5 Entrance Stairs-Rails: Right Home Layout: One level     Bathroom Shower/Tub: Teacher, early years/pre: Handicapped height     Home Equipment: Conservation officer, nature (2 wheels)   Additional Comments: info was taken from PT eval. patient unable to give accurate PLOF at this time,.      Prior Functioning/Environment               Mobility Comments: using RW for ambulation          OT Problem List: Impaired balance (sitting and/or standing);Decreased activity tolerance;Decreased cognition;Decreased safety awareness;Decreased knowledge of precautions;Pain      OT Treatment/Interventions: Self-care/ADL training;Therapeutic exercise;Neuromuscular education;DME and/or AE instruction;Energy conservation;Therapeutic activities;Patient/family education;Balance training    OT Goals(Current goals can be found in the care plan section) Acute Rehab OT Goals Patient Stated Goal: "why am i doing this?" OT Goal Formulation: Patient unable to participate in goal setting Time For Goal Achievement: 07/10/21 Potential to Achieve Goals: Good  OT Frequency: Min 2X/week    Co-evaluation              AM-PAC OT "6 Clicks" Daily Activity     Outcome Measure Help from another person eating meals?: A Little Help from another person taking care of personal grooming?: A Little Help from another person toileting, which includes using toliet, bedpan, or urinal?: Total Help from another person bathing (including washing, rinsing, drying)?: Total Help from another person to put on and taking off regular upper body clothing?: A Lot Help from another person to put on and taking  off regular lower body clothing?: A Lot 6 Click Score: 12   End of Session Nurse Communication: Mobility status  Activity Tolerance: Patient limited by pain Patient left: in bed;with call bell/phone within reach;with bed alarm set  OT Visit Diagnosis: Unsteadiness on feet (R26.81);Pain;Muscle weakness (generalized) (M62.81)                Time: 0347-4259 OT Time Calculation (min): 22 min Charges:  OT General Charges $OT Visit: 1 Visit OT Evaluation $OT Eval Moderate Complexity: 1 Mod  Jackelyn Poling OTR/L, MS Acute Rehabilitation Department Office# [phone] Pager# [phone]   Marcellina Millin 06/26/2021, 1:06 PM

## 2021-06-26 NOTE — TOC Progression Note (Signed)
Transition of Care [facility]) - Progression Note    Patient Details  Name: Erik Fletcher. MRN: [id number] Date of Birth: May 28, 1951  [facility] CM/SW Contact  Breklyn Fabrizio, Marjie Skiff, RN Phone Number: 06/26/2021, 2:57 PM  Clinical Narrative:    Met with pt at bedside for continued dc planning. Pt attempting to eat a few bites of his lunch at the time. Pt continues to state that he wants to go home when he leaves the hospital. Attempted to contact pt wife with no success. Called pt daughter Junie Panning to inquire about home support and home DME needs. Junie Panning does confirm that the family would rather have pt home with hospice and not in a facility. Junie Panning states that at this time there is no room in the home for a hospital bed. She will speak to the family this evening to figure out how to make home work for pt and ensure he has adequate care. She does request ambulance transport home. TOC will continue to follow.    Expected Discharge Plan: Home w Hospice Care Barriers to Discharge: Continued Medical Work up  Expected Discharge Plan and Services Expected Discharge Plan: [place]   Discharge Planning Services: CM Consult Post Acute Care Choice: Hospice Living arrangements for the past 2 months: Single Family Home                 DME Arranged: 3-N-1         HH Arranged: Disease Management [place] Agency: Hospice and [place] Date [place]: 06/21/21 Time [place]: 1409 Representative spoke with at [place]: [place] ([place]) Interventions    Readmission Risk Interventions No flowsheet data found.

## 2021-06-27 DIAGNOSIS — J90 Pleural effusion, not elsewhere classified: Secondary | ICD-10-CM | POA: Diagnosis not present

## 2021-06-27 NOTE — Progress Notes (Signed)
IP PROGRESS NOTE  Subjective:   Mr. Nordquist appears unchanged.  He denies pain.  The nursing staff report he has limited oral intake.  He remains bedbound.  Objective: Vital signs in last 24 hours: Blood pressure 124/85, pulse 86, temperature (!) 97.4 F (36.3 C), temperature source Oral, resp. rate 18, height 5' 8" (1.727 m), SpO2 94 %.  Intake/Output from previous day: 01/03 0701 - 01/04 0700 In: 3565.9 [P.O.:120; I.V.:3445.9] Out: 1350 [Urine:1350]  Physical Exam:  HEENT: No thrush or ulcers Lungs: Distant breath sounds, no respiratory distress Cardiac: Regular rate and rhythm Abdomen: The liver edge is palpable in the subxiphoid region, nontender, no splenomegaly, the abdomen is mildly distended Extremities: Pitting edema in the left greater than right lower leg Neurologic: Follows commands, oriented to place and diagnosis, unable to sit up in bed independently or with assistance  Portacath/PICC-without erythema  Lab Results: Recent Labs    06/24/21 0849 06/26/21 0524  WBC 23.6* 27.9*  HGB 10.3* 10.7*  HCT 30.0* 32.8*  PLT 249 270    BMET Recent Labs    06/26/21 0524  NA 137  K 3.8  CL 98  CO2 25  GLUCOSE 119*  BUN 13  CREATININE 0.76  CALCIUM 8.3*    Lab Results  Component Value Date   CEA 54.5 (H) 12/01/2020   [id number] 82,956 (H) 06/06/2021    Studies/Results: No results found.  Medications: I have reviewed the patient's current medications.  Assessment/Plan: Intrahepatic cholangiocarcinoma, clinical stage IIIb (T2N1M0) Abdominal ultrasound 11/24/2020-by lobar hepatic lesions including a large infiltrative right liver lesion and heterogenous mass superior to the pancreas head MRI abdomen 11/29/2020-numerous hypoenhancing by lobar liver lesions suggestive of cholangiocarcinoma, multiple abnormal portacaval and celiac axis/gastropathic ligament nodes consistent with metastatic disease Ultrasound-guided biopsy of a right liver lesion  12/11/2020-adenocarcinoma, immunohistochemical profile and morphology consistent with a pancreaticobiliary primary MSS, tumor mutation burden-1, no FGFR2 alteration Markedly elevated CA 19-9 CT 12/20/2020-numerous right and left liver lesions, numerous portacaval, celiac, and gastropathic ligament nodes, no evidence of metastatic disease in the chest Cycle 1 gemcitabine/cisplatin/Durvalumab 12/28/2020 Cycle 2 gemcitabine/cisplatin/Durvalumab 01/19/2021 Cycle 3 gemcitabine/cisplatin/Durvalumab 02/09/2021, day 8 held due to hypotension/dehydration CTs 02/27/2021-new and enlarging hepatic metastatic disease and enlarging periportal/gastrohepatic ligament adenopathy. Cycle 1 FOLFOX 03/06/2021 Cycle 2 FOLFOX 03/21/2021 Cycle 3 FOLFOX 05/24/2021 Glaucoma Anorexia/weight loss secondary to #1 Pain secondary to #1 Port-A-Cath placement 12/27/2020 TSH markedly elevated 05/04/2021-Euthyrox increased from 50 mcg to 100 mcg Severe neutropenia following cycle 3 FOLFOX, chemotherapy held 06/06/2021, Udenyca to be added with cycle 4 Admission 06/19/2021 following a fall New left effusion on chest CT 06/19/2021 Altered mental status    Mr. Demelo appears weaker over the past few days.  He is bedbound with limited oral intake.  He understands his family is unable to care for him in his current condition.  He agrees to placement. I discussed the case with Ms. Tibbetts by telephone this morning.  She understands the poor prognosis.  She agrees to hospice care.  I feel he is a candidate for [facility].  She is in agreement.  Recommendations: 1.  Consult to [place] for [facility] referral 2.  I will serve as the primary provider with hospice.  I will follow him at [facility] 3.  Discontinue IV fluids    LOS: 8 days   Betsy Coder, MD   06/27/2021, 8:15 AM

## 2021-06-27 NOTE — Progress Notes (Addendum)
Manufacturing engineer [facility]) Hospital Liaison note.     Received request from [employer] for family interest in [facility]. Chart and pt information under review by [facility] physician.    Hospice eligibility confirmed.   [facility] is unable to offer a room today. Hospital Liaison will follow up tomorrow or sooner if a room becomes available.   [facility] left voicemail for wife.   Please do not hesitate to call with questions.    Thank you for the opportunity to participate in this patient's care.  Buck Mam [facility] Liaison [phone]

## 2021-06-27 NOTE — Progress Notes (Signed)
PROGRESS NOTE    Erik Fletcher.  KAJ:[id number] DOB: [birth year] DOA: 06/19/2021 PCP: Lawerance Cruel, MD   Chief Complaint  Patient presents with   Fall  Brief Narrative/Hospital Course: Erik Fletcher., 71 y.o. 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  On 12/26 he was walking to the bathroom and suddenly had loss of consciousness and fell backwards and was having back pain.  Has been having syncopal episodes with falls several times a month seen by oncology felt to be orthostatic hypotension and was started on midodrine in November and also had his cortisol checked on 11/11 that was up at 28.  Patient denies any chest pain palpitation shortness of breath nausea vomiting. In the ED hemodynamically stable. WBC of 21K, hemoglobin of 10,lactate of 4. Sodium of 134, K of 3.9, creatinine was 0.85, BG of 93 CT head and cervical spine was negative for acute findings. CT chest shows moderate-sized left pleural effusion with compressive atelectasis.   Eventually it was determined that he was end stage with no further options for aggressibve therapy and he will d/c to [facility] when bed avail   Subjective:  Remains weak -very slow speech finds it hard to find words Seems to have eaten well Did very poorly with PT OT needing maximum assistance 2+ and advised SNF  Assessment & Plan:  Recurrent syncope with loss of consciousness and falls: Cortisol level was up, at 28 In November-felt to be orthostatic and was placed on midodrine in November by his oncology.  Continue MIDODRINE 10 mg 3 times, compression stockings, consider abdominal binder if recurs.  PT OT eval completed has advised skilled nursing facility .  His wife also has cancer and 2 daughters have been managing at home they prefer him to come home but likely will go to Hospice if bed avail in am Patient  has had multiple fluid boluses  echocardiogram with preserved EF G1 DD.  Metastatic cholangiocarcinoma on chemotherapy by Dr. Learta Codding.  CT chest showed hypodense lesions/masses throughout the liver consistent with hepatic metastasis scattered areas of free fluid/ascites in the upper abdomen. Discussed with Dr. Learta Codding and unfortunately will not be able to tolerate further chemotherapy and have advised hospice  [facility]erral [facility]  Failure to thrive: In the setting of malignancy worsening.  Patient has had very poor intake per nursing staff.  Now he is not able to ambulate on his own.  Acute metabolic encephalopathy/hospital-acquired delirium in the setting of his metastatic cancer, hospitalization. Improved, but not completely clear  Left-sided pleural effusion seen on the CT chest:In the setting of immunocompromise status placed on IV Unasyn-leukocytosis downtrending, completed antibiotics.  Wondering if pleural effusion is malignant but cytology negative and ultrasound thoracentesis-s/p US thoracentesis  325 ml fluid removed-post procedure chest x-ray with no pneumothorax and no significant pleural effusion 12/28,Culture -gram stain no growth.  He is saturating 92 to 90% on room air.  Mild left hand cellulitis completed Unasyn.he had dog scratch.  Received tetanus booster in the ED.  Lactic acidosis: Unclear etiology could be multifactorial was on metformin at home now here with pleural effusion, has  metastatic cholangiocarcinoma,-causing delay in clearance-  was discussed with PCCM, could be due to high tumor burden, patient was given multiple boluses, remains hemodynamically stable, afebrile.    Right bacterial conjunctivitis on erythromycin ointment 3 times daily  Hypothyroidism with TSH 39 Free T4 also  low Synthroid dose was increased  Leukocytosis:WBC level have been fluctuating suspect in the setting of metastatic disease.  Currently afebrile.  Recent Labs  Lab  06/21/21 0500 06/22/21 0605 06/23/21 0625 06/24/21 0849 06/26/21 0524  WBC 29.1* 27.4* 28.4* 23.6* 27.9*    Lower leg edema-negative for DVT in duplex.   Goals of care: DNR, plan is for hospice Palliative care not involved since Dr. Ammie Dalton following.  Overall prognosis poor to guarded.  DVT prophylaxis: enoxaparin (LOVENOX) injection 40 mg Start: 06/21/21 1000 SCDs Start: 06/20/21 0008 Code Status:   Code Status: DNR Family Communication: plan of care discussed with patient at bedside. I had discussed with his wife and daughter previously.   Status is: Inpatient Remains inpatient appropriate because:  Disposition: Currently not medically stable for discharge.  Unsafe disposition Anticipated Disposition: SNF vs Home with hospice.  Objective: Vitals last 24 hrs: Vitals:   06/26/21 0436 06/26/21 1311 06/26/21 2124 06/27/21 0514  BP: (!) 137/107 130/82 121/83 124/85  Pulse: (!) 109 81 80 86  Resp: 14 20 16 18   Temp: 98.9 F (37.2 C) 97.8 F (36.6 C) 97.8 F (36.6 C) (!) 97.4 F (36.3 C)  TempSrc: Oral Oral Oral Oral  SpO2: 93% 93% 94% 94%  Height:   5\' 8"  (1.727 m)    Weight change:   Intake/Output Summary (Last 24 hours) at 06/27/2021 1441 Last data filed at 06/27/2021 1247 Gross per 24 hour  Intake 3231.85 ml  Output 1350 ml  Net 1881.85 ml    Net IO Since Admission: 13,126.66 mL [06/27/21 1441]   Physical Examination: General exam: AAOx 3, ill looking,  HEENT:Oral mucosa moist, Ear/Nose WNL grossly, dentition normal. Respiratory system: bilaterally clear, no use of accessory muscle Cardiovascular system: S1 & S2 +, No JVD,. Gastrointestinal system: Abdomen soft, NT,ND, BS+ Nervous System:Alert, awake, moving extremities and grossly nonfocal Extremities: mild edema, distal peripheral pulses palpable.  Skin: No rashes,no icterus. MSK: Normal muscle bulk,tone, power   Medications reviewed:  Scheduled Meds:  brimonidine  1 drop Right Eye BID   And   timolol   1 drop Right Eye BID   Chlorhexidine Gluconate Cloth  6 each Topical Daily   cycloSPORINE  1 drop Both Eyes BID   enoxaparin (LOVENOX) injection  40 mg Subcutaneous Q24H   latanoprost  1 drop Right Eye QHS   levothyroxine  125 mcg Oral q morning   midodrine  10 mg Oral TID WC   Continuous Infusions:    Diet Order             Diet regular Room service appropriate? Yes; Fluid consistency: Thin  Diet effective now                  Weight change:   Wt Readings from Last 3 Encounters:  06/06/21 78.9 kg  05/24/21 79.7 kg  05/11/21 77.6 kg   Consultants:see note  Procedures:see note Antimicrobials: Anti-infectives (From admission, onward)    Start     Dose/Rate Route Frequency Ordered Stop   06/20/21 0200  Ampicillin-Sulbactam (UNASYN) 3 g in sodium chloride 0.9 % 100 mL IVPB        3 g 200 mL/hr over 30 Minutes Intravenous Every 6 hours 06/20/21 0022 06/24/21 2057   06/19/21 2200  vancomycin (VANCOREADY) IVPB 1750 mg/350 mL        1,750 mg 175 mL/hr over 120 Minutes Intravenous  Once 06/19/21 2127 06/20/21 0856   06/19/21 2130  vancomycin (VANCOCIN) IVPB 1000 mg/200  mL premix  Status:  Discontinued        1,000 mg 200 mL/hr over 60 Minutes Intravenous  Once 06/19/21 2119 06/19/21 2125   06/19/21 2130  ceFEPIme (MAXIPIME) 2 g in sodium chloride 0.9 % 100 mL IVPB  Status:  Discontinued        2 g 200 mL/hr over 30 Minutes Intravenous  Once 06/19/21 2119 06/25/21 0917      Culture/Microbiology    Component Value Date/Time   SDES  06/20/2021 1428    PLEURAL Performed at [facility], [place] [address] [place], [place] [address]    SPECREQUEST  06/20/2021 1428    NONE Performed at [facility], [place] [address], [place], [place] [address]    CULT  06/20/2021 1428    NO GROWTH Performed at [facility], [place] [address], [place], [place] [address]    REPTSTATUS 06/24/2021 FINAL 06/20/2021 1428    Other culture-see  note  Unresulted Labs (From admission, onward)    None      Data Reviewed: I have personally reviewed following labs and imaging studies CBC: Recent Labs  Lab 06/21/21 0500 06/22/21 0605 06/23/21 0625 06/24/21 0849 06/26/21 0524  WBC 29.1* 27.4* 28.4* 23.6* 27.9*  HGB 10.3* 10.8* 10.5* 10.3* 10.7*  HCT 31.4* 32.1* 31.3* 30.0* 32.8*  MCV 102.3* 100.6* 100.6* 100.3* 101.5*  PLT 246 261 260 249 500    Basic Metabolic Panel: Recent Labs  Lab 06/21/21 0500 06/22/21 0605 06/23/21 0625 06/26/21 0524  NA 133* 132* 132* 137  K 4.0 3.5 3.3* 3.8  CL 100 99 98 98  CO2 22 24 25 25   GLUCOSE 129* 108* 111* 119*  BUN 10 12 13 13   CREATININE 0.87 0.89 0.95 0.76  CALCIUM 7.7* 7.7* 7.7* 8.3*    GFR: CrCl cannot be calculated (Unknown ideal weight.). Liver Function Tests: No results for input(s): AST, ALT, ALKPHOS, BILITOT, PROT, ALBUMIN in the last 168 hours.  Recent Labs  Lab 06/20/21 1637  LIPASE 18  AMYLASE 84    No results for input(s): AMMONIA in the last 168 hours. Coagulation Profile: No results for input(s): INR, PROTIME in the last 168 hours.  Cardiac Enzymes: No results for input(s): CKTOTAL, CKMB, CKMBINDEX, TROPONINI in the last 168 hours.  BNP (last 3 results) No results for input(s): PROBNP in the last 8760 hours. HbA1C: No results for input(s): HGBA1C in the last 72 hours. CBG: Recent Labs  Lab 06/21/21 0808  GLUCAP 117*    Lipid Profile: No results for input(s): CHOL, HDL, LDLCALC, TRIG, CHOLHDL, LDLDIRECT in the last 72 hours. Thyroid Function Tests: No results for input(s): TSH, T4TOTAL, FREET4, T3FREE, THYROIDAB in the last 72 hours.  Anemia Panel: No results for input(s): VITAMINB12, FOLATE, FERRITIN, TIBC, IRON, RETICCTPCT in the last 72 hours. Sepsis Labs: Recent Labs  Lab 06/20/21 1525 06/21/21 0500 06/23/21 0844  LATICACIDVEN 5.3* 6.3* 3.7*     Recent Results (from the past 240 hour(s))  Resp Panel by RT-PCR (Flu A&B, Covid)  Nasopharyngeal Swab     Status: None   Collection Time: 06/19/21  8:05 PM   Specimen: Nasopharyngeal Swab; Nasopharyngeal(NP) swabs in vial transport medium  Result Value Ref Range Status   SARS Coronavirus 2 by RT PCR NEGATIVE NEGATIVE Final    Comment: (NOTE) SARS-CoV-2 target nucleic acids are NOT DETECTED.  The SARS-CoV-2 RNA is generally detectable in upper respiratory specimens during the acute phase of infection. The lowest concentration of SARS-CoV-2  viral copies this assay can detect is 138 copies/mL. A negative result does not preclude SARS-Cov-2 infection and should not be used as the sole basis for treatment or other patient management decisions. A negative result may occur with  improper specimen collection/handling, submission of specimen other than nasopharyngeal swab, presence of viral mutation(s) within the areas targeted by this assay, and inadequate number of viral copies(<138 copies/mL). A negative result must be combined with clinical observations, patient history, and epidemiological information. The expected result is Negative.  Fact Sheet for Patients:  EntrepreneurPulse.com.au  Fact Sheet for Healthcare Providers:  IncredibleEmployment.be  This test is no t yet approved or cleared by the [place] FDA and  has been authorized for detection and/or diagnosis of SARS-CoV-2 by FDA under an Emergency Use Authorization (EUA). This EUA will remain  in effect (meaning this test can be used) for the duration of the COVID-19 declaration under Section 564(b)(1) of the Act, 21 U.S.C.section 360bbb-3(b)(1), unless the authorization is terminated  or revoked sooner.       Influenza A by PCR NEGATIVE NEGATIVE Final   Influenza B by PCR NEGATIVE NEGATIVE Final    Comment: (NOTE) The Xpert Xpress SARS-CoV-2/FLU/RSV plus assay is intended as an aid in the diagnosis of influenza from Nasopharyngeal swab specimens and should not be  used as a sole basis for treatment. Nasal washings and aspirates are unacceptable for Xpert Xpress SARS-CoV-2/FLU/RSV testing.  Fact Sheet for Patients: EntrepreneurPulse.com.au  Fact Sheet for Healthcare Providers: IncredibleEmployment.be  This test is not yet approved or cleared by the [place] FDA and has been authorized for detection and/or diagnosis of SARS-CoV-2 by FDA under an Emergency Use Authorization (EUA). This EUA will remain in effect (meaning this test can be used) for the duration of the COVID-19 declaration under Section 564(b)(1) of the Act, 21 U.S.C. section 360bbb-3(b)(1), unless the authorization is terminated or revoked.  Performed at [facility], [place] [address] [place], [place] [address]   Blood Culture (routine x 2)     Status: None   Collection Time: 06/19/21 10:16 PM   Specimen: BLOOD  Result Value Ref Range Status   Specimen Description   Final    BLOOD RIGHT CHEST Performed at [place] [address] [place], [place] [address]    Special Requests   Final    Blood Culture adequate volume BOTTLES DRAWN AEROBIC AND ANAEROBIC Performed at [place] [address], [place], [place] [address]    Culture   Final    NO GROWTH 5 DAYS Performed at [facility], [place] [address], [place], [place] [address]    Report Status 06/24/2021 FINAL  Final  Blood Culture (routine x 2)     Status: None   Collection Time: 06/19/21 10:16 PM   Specimen: BLOOD  Result Value Ref Range Status   Specimen Description   Final    BLOOD BLOOD RIGHT WRIST Performed at [place] [address] [place], [place] [address]    Special Requests   Final    Blood Culture results may not be optimal due to an inadequate volume of blood received in culture bottles BOTTLES DRAWN AEROBIC AND ANAEROBIC Performed at [facility], [place]  [address] [place], [place] [address]    Culture   Final    NO GROWTH 5 DAYS Performed at [facility], [place] [address] [place], [place] [address]    Report Status  06/24/2021 FINAL  Final  Body fluid culture w Gram Stain     Status: None   Collection Time: 06/20/21  2:28 PM   Specimen: PATH Cytology Pleural fluid  Result Value Ref Range Status   Specimen Description   Final    PLEURAL Performed at [place] [address] [place], [place] [address]    Special Requests   Final    NONE Performed at [facility], [place] [address], [place], [place] [address]    Gram Stain   Final    FEW WBC PRESENT, PREDOMINANTLY MONONUCLEAR NO ORGANISMS SEEN    Culture   Final    NO GROWTH Performed at [facility], [place] [address] [place], [place] [address]    Report Status 06/24/2021 FINAL  Final      Radiology Studies: No results found.   LOS: 8 days   Nita Sells, MD [facility]  06/27/2021, 2:41 PM

## 2021-06-27 NOTE — Discharge Planning (Signed)
Oncology Discharge Planning Note  [facility] [place] Address: [address] [place], [place], [place] [address] Hours of Operation:  Nena Polio, Monday - Friday  Clinic Contact Information:  [phone]) [phone]  Oncology Care Team: Medical Oncologist:  Dr. Betsy Coder  Patient Details: Name:  Erik Fletcher, Erik Fletcher MRN:   [id number] DOB:   August 11, 1950 Reason for Current Admission: Pleural effusion  Discharge Planning Narrative: Notification of admission received by Dr. Benay Spice for Erik Fletcher.  Discharge plans are for transfer to [facility] vs SNF with Hospice. Dr. Benay Spice will be attending.   Outpatient Oncology Specific Care Only: Oncology appointment transportation needs addressed?:  not applicable Oncology medication management for symptom management addressed?:  not applicable Chemo Alert Card reviewed?:  not applicable Immunotherapy Alert Card reviewed?:  not applicable

## 2021-06-28 NOTE — Progress Notes (Signed)
PROGRESS NOTE   Erik Fletcher.  BVA:[id number] DOB: October 09, 1950 DOA: 06/19/2021 PCP: Lawerance Cruel, MD  Brief Narrative:   71 year old white male 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 HTN DM TY 2 glaucoma Admitted 1226 syncope fall + back pain Started on midodrine recently secondary to orthostasis Found to have WBC of 21 lactate of 4 CT head cervical spine negative CT chest pleural effusions and atelectasis  Hospital-Problem based course  Recurrent syncope loss of consciousness fall Orthostasis secondary to probable cancer state-continue midodrine for now compression stockings Once he goes to hospice will stop all these medications Metastatic cholangio-CA CT chest shows metastases free fluid ascites Not a candidate for further chemo and advised home with hospice Left hand cellulitis secondary to dog scratch Resolved after completing Unasyn Left-sided pleural effusion lactic acidosis Status postthoracentesis 12/28 no effusion no growth and completed antibiotics Lactic acidosis likely Leukocytosis secondary to metformin could be secondary to tumor profile Leukocytosis Secondary to elevated  metastatic disease Hypothyroid Found to have TSH 39 Free T4 low therefore Synthroid increased     DVT prophylaxis: Lovenox Code Status: DNR Family Communication: Attempted to call family member 1/5 could not leave voicemail Disposition:  Status is: Inpatient Need for placement oncology   Consultants:  onsclogy a  Procedures:   Antimicrobials:     Subjective:  wake coherent no distress seems comfortable not really eating   Objective: Vitals:   06/27/21 2101 06/28/21 0504 06/28/21 1339 06/28/21 1418  BP: 119/84 109/78 111/82   Pulse: (!) 110 86 77   Resp: 18 16 18    Temp: (!) 97.4 F (36.3 C) 97.8 F (36.6 C) 97.8 F (36.6 C)   TempSrc: Oral Oral    SpO2: 92% 95% 95%   Weight:    78.9 kg  Height:        Intake/Output Summary (Last 24 hours) at 06/28/2021  1701 Last data filed at 06/28/2021 1300 Gross per 24 hour  Intake 355 ml  Output 1800 ml  Net -1445 ml   Filed Weights   06/28/21 1418  Weight: 78.9 kg    Examination:  Coherent no distress EOMI NCAT no focal deficit CTA B no added sound no rales no rhonchi Abdomen soft nontender no rebound no guarding S1-S2 no murmur no rub no gallop  Data Reviewed: personally reviewed   CBC    Component Value Date/Time   WBC 27.9 (H) 06/26/2021 0524   RBC 3.23 (L) 06/26/2021 0524   HGB 10.7 (L) 06/26/2021 0524   HGB 9.5 (L) 06/06/2021 0946   HCT 32.8 (L) 06/26/2021 0524   PLT 270 06/26/2021 0524   PLT 59 (L) 06/06/2021 0946   MCV 101.5 (H) 06/26/2021 0524   MCH 33.1 06/26/2021 0524   MCHC 32.6 06/26/2021 0524   RDW 15.9 (H) 06/26/2021 0524   LYMPHSABS 1.0 06/19/2021 2005   MONOABS 2.0 (H) 06/19/2021 2005   EOSABS 0.0 06/19/2021 2005   BASOSABS 0.1 06/19/2021 2005   CMP Latest Ref Rng & Units 06/26/2021 06/23/2021 06/22/2021  Glucose 70 - 99 mg/dL 119(H) 111(H) 108(H)  BUN 8 - 23 mg/dL 13 13 12   Creatinine 0.61 - 1.24 mg/dL 0.76 0.95 0.89  Sodium 135 - 145 mmol/L 137 132(L) 132(L)  Potassium 3.5 - 5.1 mmol/L 3.8 3.3(L) 3.5  Chloride 98 - 111 mmol/L 98 98 99  CO2 22 - 32 mmol/L 25 25 24   Calcium 8.9 - 10.3 mg/dL 8.3(L) 7.7(L) 7.7(L)  Total Protein 6.5 - 8.1 g/dL - - -  Total Bilirubin 0.3 - 1.2 mg/dL - - -  Alkaline Phos 38 - 126 U/L - - -  AST 15 - 41 U/L - - -  ALT 0 - 44 U/L - - -     Radiology Studies: No results found.   Scheduled Meds:  brimonidine  1 drop Right Eye BID   And   timolol  1 drop Right Eye BID   Chlorhexidine Gluconate Cloth  6 each Topical Daily   cycloSPORINE  1 drop Both Eyes BID   enoxaparin (LOVENOX) injection  40 mg Subcutaneous Q24H   latanoprost  1 drop Right Eye QHS   levothyroxine  125 mcg Oral q morning   midodrine  10 mg Oral TID WC   Continuous Infusions:   LOS: 9 days   Time spent: [place], MD [facility] To contact the attending provider between 7A-7P or the covering provider during after hours 7P-7A, please log into the web site [email or web address] and access using universal [place] password for that web site. If you do not have the password, please call the hospital operator.  06/28/2021, 5:01 PM

## 2021-06-28 NOTE — Progress Notes (Signed)
[id number] [facility]) Hospital Liaison note   [place] is unable to offer a room today. Hospital Liaison will follow up tomorrow or sooner if a room becomes available. Please do not hesitate to call with questions.     Thank you for the opportunity to participate in this patient's care.   [facility] Liaison [phone]

## 2021-06-28 NOTE — Care Management Important Message (Signed)
Important Message  Patient Details Patient under Hospice no IM Letter delivered. Name: Erik Fletcher. MRN: [id number] Date of Birth: February 03, 1951   Medicare Important Message Given:  No     Kerin Salen 06/28/2021, 12:55 PM

## 2021-06-29 ENCOUNTER — Telehealth: Payer: Self-pay

## 2021-06-29 MED ORDER — BISACODYL 5 MG PO TBEC
5.0000 mg | DELAYED_RELEASE_TABLET | Freq: Once | ORAL | Status: AC
  Administered 2021-06-29: 5 mg via ORAL
  Filled 2021-06-29: qty 1

## 2021-06-29 MED ORDER — CYCLOSPORINE 0.05 % OP EMUL: 1.0000 [drp] | Freq: Two times a day (BID) | OPHTHALMIC | Status: AC

## 2021-06-29 MED ORDER — BRIMONIDINE TARTRATE 0.2 % OP SOLN: 1.0000 [drp] | Freq: Two times a day (BID) | OPHTHALMIC | 12 refills | Status: AC

## 2021-06-29 MED ORDER — TIMOLOL MALEATE 0.5 % OP SOLN: 1.0000 [drp] | Freq: Two times a day (BID) | OPHTHALMIC | 12 refills | Status: AC

## 2021-06-29 MED ORDER — LEVOTHYROXINE SODIUM 125 MCG PO TABS: 125.0000 ug | ORAL_TABLET | Freq: Every morning | ORAL | 0 refills | Status: AC

## 2021-06-29 MED ORDER — LATANOPROST 0.005 % OP SOLN: 1.0000 [drp] | Freq: Every day | OPHTHALMIC | 12 refills | Status: AC

## 2021-06-29 NOTE — Discharge Summary (Signed)
Physician Discharge Summary  Erik Fletcher. PFX:[id number] DOB: 04-18-1951 DOA: 06/19/2021  PCP: Lawerance Cruel, MD  Admit date: 06/19/2021 Discharge date: 06/29/2021  Time spent: 26 minutes  Recommendations for Outpatient Follow-up:  Patient being discharged to freestanding hospice facility  Discharge Diagnoses:  MAIN problem for hospitalization   Hypotension in the setting of left-sided pleural effusion and metastatic cholangiocarcinoma  Please see below for itemized issues addressed in [place]- refer to other progress notes for clarity if needed  Discharge Condition: Guarded  Diet recommendation: Comfort  Filed Weights   06/28/21 1418  Weight: 78.9 kg    History of present illness:  71 year old white male 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 HTN DM TY 2 glaucoma Admitted 1226 syncope fall + back pain Started on midodrine recently secondary to orthostasis Found to have WBC of 21 lactate of 4 CT head cervical spine negative CT chest left-sided pleural effusions and atelectasis  Oncology was consulted and recommended no further aggressive options and hospice and patient will be discharging to hospice facility  Hospit al Course:  Recurrent syncope loss of consciousness fall Orthostasis secondary to probable cancer state-continue midodrine for now compression stockings Once he goes to hospice will stop all these medications although can keep him on this if he remains coherent Metastatic cholangio-CA CT chest shows metastases free fluid ascites Not a candidate for further chemo and advised home with hospice but as patient's wife is also battling cancer it was felt to be better placed at freestanding hospice facility Left hand cellulitis secondary to dog scratch Resolved after completing Unasyn Left-sided pleural effusion lactic acidosis Status postthoracentesis 12/28 no effusion no growth and completed antibiotics Lactic acidosis likely Leukocytosis secondary to  metformin could be secondary to tumor profile No further work-up Leukocytosis Secondary to elevated  metastatic disease Hypothyroid Found to have TSH 39 Free T4 low therefore Synthroid increased to 125 mcg daily  Awake but minimally  Discharge Exam: Vitals:   06/28/21 2110 06/29/21 0505  BP: 118/87 114/83  Pulse: 85 90  Resp: 16 16  Temp: 97.7 F (36.5 C) 97.7 F (36.5 C)  SpO2: 96% 93%    Subj on day of d/c   Eating Responsive but not completely coherent No chest pain no fever  General Exam on discharge  EOMI NCAT no overt deficit CTA B no rales no rhonchi ROM intact no focal deficit S1-S2 no murmur no rub no gallop No lower extremity edema although quite weak  Discharge Instructions   Discharge Instructions     Diet - low sodium heart healthy   Complete by: As directed    Increase activity slowly   Complete by: As directed    No wound care   Complete by: As directed       Allergies as of 06/29/2021   No Known Allergies      Medication List     STOP taking these medications    ciprofloxacin 500 MG tablet Commonly known as: Cipro   Combigan 0.2-0.5 % ophthalmic solution Generic drug: brimonidine-timolol   glucose monitoring kit monitoring kit   HYDROcodone-acetaminophen 5-325 MG tablet Commonly known as: Norco   ibuprofen 200 MG tablet Commonly known as: ADVIL   lidocaine-prilocaine cream Commonly known as: EMLA   magnesium oxide 400 (240 Mg) MG tablet Commonly known as: MAG-OX   metFORMIN 500 MG tablet Commonly known as: GLUCOPHAGE   MULTIVITAMIN PO   ondansetron 8 MG tablet Commonly known as: ZOFRAN   potassium chloride SA 20 MEQ tablet  Commonly known as: KLOR-CON M   prochlorperazine 10 MG tablet Commonly known as: COMPAZINE       TAKE these medications    brimonidine 0.2 % ophthalmic solution Commonly known as: ALPHAGAN Place 1 drop into the right eye 2 (two) times daily.   cycloSPORINE 0.05 % ophthalmic  emulsion Commonly known as: Restasis Place 1 drop into both eyes 2 (two) times daily.   latanoprost 0.005 % ophthalmic solution Commonly known as: XALATAN Place 1 drop into the right eye at bedtime.   levothyroxine 125 MCG tablet Commonly known as: Euthyrox Take 1 tablet (125 mcg total) by mouth every morning. Start taking on: June 30, 2021 What changed:  medication strength how much to take when to take this   midodrine 5 MG tablet Commonly known as: PROAMATINE Take 1 tablet (5 mg total) by mouth 2 (two) times daily with a meal. Take 5 mg at 8 am and take 5 mg at 2 pm What changed: when to take this   timolol 0.5 % ophthalmic solution Commonly known as: TIMOPTIC Place 1 drop into the right eye 2 (two) times daily.       No Known Allergies    The results of significant diagnostics from this hospitalization (including imaging, microbiology, ancillary and laboratory) are listed below for reference.    Significant Diagnostic Studies: DG Pelvis 1-2 Views  Result Date: 06/19/2021 CLINICAL DATA:  Mechanical fall after syncopal episode. EXAM: PELVIS - 1-2 VIEW COMPARISON:  None. FINDINGS: Residual contrast material in the bladder. Postoperative changes in the lower lumbar spine. Degenerative changes in both hips with narrowed acetabular joint and sclerosis and osteophyte formation. No evidence of acute fracture or dislocation. No focal bone lesion or bone destruction. Bone cortex appears intact. IMPRESSION: Degenerative changes in both hips.  No acute bony abnormalities. Electronically Signed   By: Lucienne Capers M.D.   On: 06/19/2021 20:56   CT HEAD WO CONTRAST  Result Date: 06/19/2021 CLINICAL DATA:  Head trauma EXAM: CT HEAD WITHOUT CONTRAST TECHNIQUE: Contiguous axial images were obtained from the base of the skull through the vertex without intravenous contrast. COMPARISON:  None. FINDINGS: Brain: There is no mass, hemorrhage or extra-axial collection. There is  generalized atrophy without lobar predilection. Hypodensity of the white matter is most commonly associated with chronic microvascular disease. There are calcifications within the corona radiata and basal ganglia Vascular: No abnormal hyperdensity of the major intracranial arteries or dural venous sinuses. No intracranial atherosclerosis. Skull: The visualized skull base, calvarium and extracranial soft tissues are normal. Sinuses/Orbits: No fluid levels or advanced mucosal thickening of the visualized paranasal sinuses. No mastoid or middle ear effusion. The orbits are normal. IMPRESSION: 1. No acute intracranial abnormality. 2. Atrophy and chronic microvascular ischemia. Cerebral Atrophy (ICD10-G31.9). Electronically Signed   By: Ulyses Jarred M.D.   On: 06/19/2021 20:02   CT CHEST W CONTRAST  Result Date: 06/19/2021 CLINICAL DATA:  Chest trauma, blunt. Syncopal episode with fall. Bilateral flank pain and LEFT shoulder pain. Back pain. EXAM: CT CHEST WITH CONTRAST TECHNIQUE: Multidetector CT imaging of the chest was performed during intravenous contrast administration. CONTRAST:  76m OMNIPAQUE IOHEXOL 350 MG/ML SOLN COMPARISON:  Chest CT dated 02/27/2021. FINDINGS: Cardiovascular: Thoracic aorta appears intact and stable in configuration. Scattered atherosclerosis of the thoracic aorta and coronary arteries. No significant pericardial effusion. Mediastinum/Nodes: No mass or enlarged lymph nodes are seen within the mediastinum. Esophagus is somewhat patulous, containing recently ingested material, but otherwise unremarkable. Trachea and central bronchi are  unremarkable. Lungs/Pleura: Moderate-sized LEFT pleural effusion with overlying compressive atelectasis. Mild atelectasis versus pleural thickening at the RIGHT lung base. No pneumothorax. Upper Abdomen: Hypodense lesions/masses throughout the liver, compatible with previously described hepatic metastases. Scattered areas of free fluid/ascites within the  upper abdomen. Musculoskeletal: Degenerative spondylosis of the kyphotic thoracic spine, mild to moderate in degree. Anterior cervical fusion hardware within the lower cervical spine. No acute-appearing osseous abnormality. IMPRESSION: 1. Moderate-sized LEFT pleural effusion with overlying compressive atelectasis, new compared to the earlier chest CT of 02/27/2021. 2. Known hepatic metastases, not significantly changed compared to CT of 02/27/2021. Also similar appearance of the periportal/gastrohepatic ligament adenopathy that was also described on the earlier CT. 3. Scattered free fluid/ascites within the upper abdomen. 4. No acute osseous fracture or dislocation identified. 5. Debris versus recently ingested material within the upper esophagus. This presents a risk for potential aspiration. Aortic Atherosclerosis (ICD10-I70.0). Electronically Signed   By: Franki Cabot M.D.   On: 06/19/2021 20:33   CT CERVICAL SPINE WO CONTRAST  Result Date: 06/19/2021 CLINICAL DATA:  Neck trauma, EXAM: CT CERVICAL SPINE WITHOUT CONTRAST TECHNIQUE: Multidetector CT imaging of the cervical spine was performed without intravenous contrast. Multiplanar CT image reconstructions were also generated. COMPARISON:  None. FINDINGS: Alignment: Moderate dextroscoliosis of the cervical spine. No evidence of acute vertebral body subluxation. Skull base and vertebrae: No fracture line or displaced fracture fragment is seen. Anterior cervical fusion hardware at C5 through C7 appears intact and appropriately positioned. Soft tissues and spinal canal: No prevertebral fluid or swelling. No visible canal hematoma. Disc levels: Mild degenerative spurring at multiple levels, but no more than mild central canal stenosis at any level. Upper chest: Debris versus recently ingested material within the upper esophagus. Limited images of the upper chest are otherwise unremarkable. Other: Bilateral carotid atherosclerosis. IMPRESSION: 1. No acute  findings within the cervical spine. No osseous fracture or dislocation. No evidence of osseous metastasis. 2. Anterior cervical fusion hardware at C5 through C7 appears intact and appropriately positioned. 3. Moderate scoliosis. 4. Bilateral carotid atherosclerosis. 5. Debris versus recently ingested material within the upper esophagus. This presents a potential risk for aspiration. Electronically Signed   By: Franki Cabot M.D.   On: 06/19/2021 20:43   CT T-SPINE NO CHARGE  Result Date: 06/19/2021 CLINICAL DATA:  Chest trauma, back pain. EXAM: CT THORACIC SPINE WITHOUT CONTRAST TECHNIQUE: Multidetector CT images of the thoracic were obtained using the standard protocol without intravenous contrast. COMPARISON:  None. FINDINGS: Alignment: Moderate to severe kyphosis of the thoracic spine. No evidence of acute vertebral body subluxation. Vertebrae: No fracture line or displaced fracture fragment. No evidence of acute compression fracture deformity.w Paraspinal and other soft tissues: Moderate-sized LEFT pleural effusion with overlying compressive atelectasis, new compared to a CT chest CT of 02/27/2021. Multiple nonobstructing LEFT renal stones, largest measuring 8 mm. Known hepatic metastases, incompletely imaged. Visualized paraspinal soft tissues are otherwise unremarkable. Disc levels: Degenerative spondylosis throughout the thoracolumbar spine, mild to moderate in degree, with associated mild-to-moderate central canal stenoses at multiple levels. IMPRESSION: 1. No acute findings within the thoracic spine. No acute osseous fracture or dislocation within the thoracic spine. No evidence of osseous metastasis within the thoracic spine. 2. Kyphosis and degenerative changes of the thoracic spine, as detailed above. 3. Moderate-sized LEFT pleural effusion with overlying compressive atelectasis, new compared to a recent chest CT of 02/27/2021. 4. Known hepatic metastases, incompletely imaged. 5. Nonobstructing LEFT  renal stones. Electronically Signed   By: Franki Cabot  M.D.   On: 06/19/2021 20:39   DG CHEST PORT 1 VIEW  Result Date: 06/20/2021 CLINICAL DATA:  S/P thoracentesis - image obtained bedside in U/S. EXAM: PORTABLE CHEST - 1 VIEW COMPARISON:  CT from previous day FINDINGS: No pneumothorax. No significant pleural effusion evident. Residual patchy left infrahilar atelectasis or infiltrate. Right lung clear. For stable right IJ power injectable port catheter. Heart size and mediastinal contours are within normal limits. Cervical fixation hardware partially visualized. IMPRESSION: No pneumothorax post thoracentesis. Electronically Signed   By: Lucrezia Europe M.D.   On: 06/20/2021 14:50   DG Lumbar Spine 2-3Vclearing  Result Date: 06/19/2021 CLINICAL DATA:  Low back pain after fall. EXAM: LIMITED LUMBAR SPINE FOR TRAUMA CLEARING - 2-3 VIEW COMPARISON:  September 13, 2015. FINDINGS: Status post surgical posterior fusion of L3-4 and L4-5 with bilateral intrapedicular screw placement and interbody fusion. No fracture or spondylolisthesis is noted. IMPRESSION: Postsurgical changes as described above.  No acute abnormality seen. Electronically Signed   By: Marijo Conception M.D.   On: 06/19/2021 20:55   ECHOCARDIOGRAM COMPLETE  Result Date: 06/21/2021    ECHOCARDIOGRAM REPORT   Patient Name:   Erik Fletcher. Date of Exam: 06/21/2021 Medical Rec #:  [id number]               Height:       68.0 in Accession #:    [id number]              Weight:       174.0 lb Date of Birth:  03-09-1951              BSA:          1.926 m Patient Age:    33 years                BP:           129/88 mmHg Patient Gender: M                       HR:           79 bpm. Exam Location:  Inpatient Procedure: 2D Echo, Cardiac Doppler and Color Doppler Indications:    R55 Syncope  History:        Patient has no prior history of Echocardiogram examinations.  Sonographer:    Glo Herring Referring Phys: [id number] [place]  1. Left  ventricular ejection fraction, by estimation, is 55 to 60%. The left ventricle has normal function. The left ventricle has no regional wall motion abnormalities. Left ventricular diastolic parameters are consistent with Grade I diastolic dysfunction (impaired relaxation).  2. Right ventricular systolic function is normal. The right ventricular size is normal. There is normal pulmonary artery systolic pressure.  3. Moderate pleural effusion in the left lateral region.  4. The mitral valve is normal in structure. Trivial mitral valve regurgitation. No evidence of mitral stenosis.  5. The aortic valve is tricuspid. Aortic valve regurgitation is not visualized. No aortic stenosis is present.  6. Aortic dilatation noted. There is mild dilatation of the aortic root, measuring 39 mm.  7. The inferior vena cava is normal in size with greater than 50% respiratory variability, suggesting right atrial pressure of 3 mmHg. FINDINGS  Left Ventricle: Left ventricular ejection fraction, by estimation, is 55 to 60%. The left ventricle has normal function. The left ventricle has no regional wall motion abnormalities. The left ventricular internal cavity size was  normal in size. There is  no left ventricular hypertrophy. Left ventricular diastolic parameters are consistent with Grade I diastolic dysfunction (impaired relaxation). Right Ventricle: The right ventricular size is normal. No increase in right ventricular wall thickness. Right ventricular systolic function is normal. There is normal pulmonary artery systolic pressure. The tricuspid regurgitant velocity is 1.72 m/s, and  with an assumed right atrial pressure of 3 mmHg, the estimated right ventricular systolic pressure is 73.7 mmHg. Left Atrium: Left atrial size was normal in size. Right Atrium: Right atrial size was normal in size. Pericardium: Trivial pericardial effusion is present. Mitral Valve: The mitral valve is normal in structure. Trivial mitral valve regurgitation.  No evidence of mitral valve stenosis. Tricuspid Valve: The tricuspid valve is normal in structure. Tricuspid valve regurgitation is trivial. No evidence of tricuspid stenosis. Aortic Valve: The aortic valve is tricuspid. Aortic valve regurgitation is not visualized. No aortic stenosis is present. Aortic valve mean gradient measures 2.0 mmHg. Aortic valve peak gradient measures 3.4 mmHg. Aortic valve area, by VTI measures 2.65 cm. Pulmonic Valve: The pulmonic valve was normal in structure. Pulmonic valve regurgitation is trivial. No evidence of pulmonic stenosis. Aorta: Aortic dilatation noted. There is mild dilatation of the aortic root, measuring 39 mm. Venous: The inferior vena cava is normal in size with greater than 50% respiratory variability, suggesting right atrial pressure of 3 mmHg. IAS/Shunts: No atrial level shunt detected by color flow Doppler. Additional Comments: There is a moderate pleural effusion in the left lateral region.  LEFT VENTRICLE PLAX 2D LVOT diam:     1.90 cm     Diastology LV SV:         50          LV e' medial:    7.07 cm/s LV SV Index:   26          LV E/e' medial:  6.7 LVOT Area:     2.84 cm    LV e' lateral:   6.96 cm/s                            LV E/e' lateral: 6.8  LV Volumes (MOD) LV vol d, MOD A2C: 35.3 ml LV vol d, MOD A4C: 56.8 ml LV vol s, MOD A2C: 15.2 ml LV vol s, MOD A4C: 25.2 ml LV SV MOD A2C:     20.1 ml LV SV MOD A4C:     56.8 ml LV SV MOD BP:      25.6 ml RIGHT VENTRICLE             IVC RV Basal diam:  4.30 cm     IVC diam: 1.40 cm RV S prime:     13.70 cm/s LEFT ATRIUM             Index        RIGHT ATRIUM           Index LA Vol (A2C):   31.0 ml 16.09 ml/m  RA Area:     11.30 cm LA Vol (A4C):   52.8 ml 27.41 ml/m  RA Volume:   21.10 ml  10.95 ml/m LA Biplane Vol: 44.5 ml 23.10 ml/m  AORTIC VALVE                    PULMONIC VALVE AV Area (Vmax):    2.61 cm     PV Vmax:       0.94  m/s AV Area (Vmean):   2.79 cm     PV Peak grad:  3.6 mmHg AV Area (VTI):      2.65 cm AV Vmax:           92.10 cm/s AV Vmean:          65.700 cm/s AV VTI:            0.188 m AV Peak Grad:      3.4 mmHg AV Mean Grad:      2.0 mmHg LVOT Vmax:         84.90 cm/s LVOT Vmean:        64.700 cm/s LVOT VTI:          0.176 m LVOT/AV VTI ratio: 0.94  AORTA Ao Root diam: 3.90 cm Ao Asc diam:  2.80 cm MITRAL VALVE               TRICUSPID VALVE MV Area (PHT): 4.57 cm    TR Peak grad:   11.8 mmHg MV Decel Time: 166 msec    TR Vmax:        172.00 cm/s MV E velocity: 47.60 cm/s MV A velocity: 66.40 cm/s  SHUNTS MV E/A ratio:  0.72        Systemic VTI:  0.18 m                            Systemic Diam: 1.90 cm Skeet Latch MD Electronically signed by Skeet Latch MD Signature Date/Time: 06/21/2021/5:30:05 PM    Final    VAS [place] LOWER EXTREMITY VENOUS (DVT)  Result Date: 06/24/2021  Lower Venous DVT Study Patient Name:  Erik Fletcher.  Date of Exam:   06/24/2021 Medical Rec #: [id number]                Accession #:    [id number] Date of Birth: 10-21-1950               Patient Gender: M Patient Age:   41 years Exam Location:  [facility] Procedure:      VAS [place] LOWER EXTREMITY VENOUS (DVT) Referring Phys: Betsy Coder --------------------------------------------------------------------------------  Indications: Swelling and tenderness LT>RT.  Comparison Study: 05-24-2021 Prior bilateral lower extremity venous was negative                   for DVT. Performing Technologist: Darlin Coco RDMS, RVT  Examination Guidelines: A complete evaluation includes B-mode imaging, spectral Doppler, color Doppler, and power Doppler as needed of all accessible portions of each vessel. Bilateral testing is considered an integral part of a complete examination. Limited examinations for reoccurring indications may be performed as noted. The reflux portion of the exam is performed with the patient in reverse Trendelenburg.  +-----+---------------+---------+-----------+----------+--------------+   RIGHT Compressibility Phasicity Spontaneity Properties Thrombus Aging  +-----+---------------+---------+-----------+----------+--------------+  CFV   Full            Yes       Yes                                    +-----+---------------+---------+-----------+----------+--------------+   +---------+---------------+---------+-----------+----------+--------------+  LEFT      Compressibility Phasicity Spontaneity Properties Thrombus Aging  +---------+---------------+---------+-----------+----------+--------------+  CFV       Full            Yes  Yes                                    +---------+---------------+---------+-----------+----------+--------------+  SFJ       Full                                                             +---------+---------------+---------+-----------+----------+--------------+  FV Prox   Full                                                             +---------+---------------+---------+-----------+----------+--------------+  FV Mid    Full                                                             +---------+---------------+---------+-----------+----------+--------------+  FV Distal Full                                                             +---------+---------------+---------+-----------+----------+--------------+  PFV       Full                                                             +---------+---------------+---------+-----------+----------+--------------+  POP       Full            Yes       Yes                                    +---------+---------------+---------+-----------+----------+--------------+  PTV       Full                                                             +---------+---------------+---------+-----------+----------+--------------+  PERO      Full                                                             +---------+---------------+---------+-----------+----------+--------------+    Summary: RIGHT: - No evidence of common femoral vein  obstruction.  LEFT: - There is no  evidence of deep vein thrombosis in the lower extremity.  - No cystic structure found in the popliteal fossa.  *See table(s) above for measurements and observations. Electronically signed by Monica Martinez MD on 06/24/2021 at 4:47:57 PM.    Final    US THORACENTESIS ASP PLEURAL SPACE W/IMG GUIDE  Result Date: 06/20/2021 INDICATION: 71 year old male referred for diagnostic thoracentesis EXAM: ULTRASOUND GUIDED LEFT THORACENTESIS MEDICATIONS: None. COMPLICATIONS: None PROCEDURE: An ultrasound guided thoracentesis was thoroughly discussed with the patient and questions answered. The benefits, risks, alternatives and complications were also discussed. The patient understands and wishes to proceed with the procedure. Written consent was obtained. Ultrasound was performed to localize and mark an adequate pocket of fluid in the left chest. The area was then prepped and draped in the normal sterile fashion. 1% Lidocaine was used for local anesthesia. Under ultrasound guidance a 8 Fr Safe-T-Centesis catheter was introduced. Thoracentesis was performed. The catheter was removed and a dressing applied. FINDINGS: A total of approximately 325 cc of thin amber fluid was removed. Samples were sent to the laboratory as requested by the clinical team. IMPRESSION: Status post ultrasound-guided left thoracentesis. Signed, Dulcy Fanny. Dellia Nims, RPVI [facility] Electronically Signed   By: Corrie Mckusick D.O.   On: 06/20/2021 14:59    Microbiology: Recent Results (from the past 240 hour(s))  Resp Panel by RT-PCR (Flu A&B, Covid) Nasopharyngeal Swab     Status: None   Collection Time: 06/19/21  8:05 PM   Specimen: Nasopharyngeal Swab; Nasopharyngeal(NP) swabs in vial transport medium  Result Value Ref Range Status   SARS Coronavirus 2 by RT PCR NEGATIVE NEGATIVE Final    Comment: (NOTE) SARS-CoV-2 target nucleic acids are NOT  DETECTED.  The SARS-CoV-2 RNA is generally detectable in upper respiratory specimens during the acute phase of infection. The lowest concentration of SARS-CoV-2 viral copies this assay can detect is 138 copies/mL. A negative result does not preclude SARS-Cov-2 infection and should not be used as the sole basis for treatment or other patient management decisions. A negative result may occur with  improper specimen collection/handling, submission of specimen other than nasopharyngeal swab, presence of viral mutation(s) within the areas targeted by this assay, and inadequate number of viral copies(<138 copies/mL). A negative result must be combined with clinical observations, patient history, and epidemiological information. The expected result is Negative.  Fact Sheet for Patients:  EntrepreneurPulse.com.au  Fact Sheet for Healthcare Providers:  [employer]  This test is no t yet approved or cleared by the [place] FDA and  has been authorized for detection and/or diagnosis of SARS-CoV-2 by FDA under an Emergency Use Authorization (EUA). This EUA will remain  in effect (meaning this test can be used) for the duration of the COVID-19 declaration under Section 564(b)(1) of the Act, 21 U.S.C.section 360bbb-3(b)(1), unless the authorization is terminated  or revoked sooner.       Influenza A by PCR NEGATIVE NEGATIVE Final   Influenza B by PCR NEGATIVE NEGATIVE Final    Comment: (NOTE) The Xpert Xpress SARS-CoV-2/FLU/RSV plus assay is intended as an aid in the diagnosis of influenza from Nasopharyngeal swab specimens and should not be used as a sole basis for treatment. Nasal washings and aspirates are unacceptable for Xpert Xpress SARS-CoV-2/FLU/RSV testing.  Fact Sheet for Patients: EntrepreneurPulse.com.au  Fact Sheet for Healthcare Providers: [employer]  This test is not yet  approved or cleared by the [place] FDA and has been authorized for detection and/or diagnosis  of SARS-CoV-2 by FDA under an Emergency Use Authorization (EUA). This EUA will remain in effect (meaning this test can be used) for the duration of the COVID-19 declaration under Section 564(b)(1) of the Act, 21 U.S.C. section 360bbb-3(b)(1), unless the authorization is terminated or revoked.  Performed at [facility], [place] [address], [place], [place] [address]   Blood Culture (routine x 2)     Status: None   Collection Time: 06/19/21 10:16 PM   Specimen: BLOOD  Result Value Ref Range Status   Specimen Description   Final    BLOOD RIGHT CHEST Performed at [place] [address] [place], [place] [address]    Special Requests   Final    Blood Culture adequate volume BOTTLES DRAWN AEROBIC AND ANAEROBIC Performed at [facility] [address], [place], [place] [address]    Culture   Final    NO GROWTH 5 DAYS Performed at [facility], [place] [address], [place], [place] [address]    Report Status 06/24/2021 FINAL  Final  Blood Culture (routine x 2)     Status: None   Collection Time: 06/19/21 10:16 PM   Specimen: BLOOD  Result Value Ref Range Status   Specimen Description   Final    BLOOD BLOOD RIGHT WRIST Performed at [facility] [address], [place], [place] [address]    Special Requests   Final    Blood Culture results may not be optimal due to an inadequate volume of blood received in culture bottles BOTTLES DRAWN AEROBIC AND ANAEROBIC Performed at [facility], [place] [address] [place], [place] [address]    Culture   Final    NO GROWTH 5 DAYS Performed at [facility], [place] [address] [place], [place] [address]    Report Status 06/24/2021 FINAL  Final  Body fluid culture w Gram Stain     Status: None   Collection Time: 06/20/21  2:28 PM   Specimen: PATH  Cytology Pleural fluid  Result Value Ref Range Status   Specimen Description   Final    PLEURAL Performed at [facility] [address] [place], [place] [address]    Special Requests   Final    NONE Performed at [facility], [place] [address] [place], [place] [address]    Gram Stain   Final    FEW WBC PRESENT, PREDOMINANTLY MONONUCLEAR NO ORGANISMS SEEN    Culture   Final    NO GROWTH Performed at [facility], [place] [address], [place], [place] [address]    Report Status 06/24/2021 FINAL  Final     Labs: Basic Metabolic Panel: Recent Labs  Lab 06/23/21 0625 06/26/21 0524  NA 132* 137  K 3.3* 3.8  CL 98 98  CO2 25 25  GLUCOSE 111* 119*  BUN 13 13  CREATININE 0.95 0.76  CALCIUM 7.7* 8.3*   Liver Function Tests: No results for input(s): AST, ALT, ALKPHOS, BILITOT, PROT, ALBUMIN in the last 168 hours. No results for input(s): LIPASE, AMYLASE in the last 168 hours. No results for input(s): AMMONIA in the last 168 hours. CBC: Recent Labs  Lab 06/23/21 0625 06/24/21 0849 06/26/21 0524  WBC 28.4* 23.6* 27.9*  HGB 10.5* 10.3* 10.7*  HCT 31.3* 30.0* 32.8*  MCV 100.6* 100.3* 101.5*  PLT 260 249 270   Cardiac Enzymes: No results for input(s): CKTOTAL, CKMB, CKMBINDEX, TROPONINI in the  last 168 hours. BNP: BNP (last 3 results) No results for input(s): BNP in the last 8760 hours.  ProBNP (last 3 results) No results for input(s): PROBNP in the last 8760 hours.  CBG: No results for input(s): GLUCAP in the last 168 hours.     Signed:  Nita Sells MD   [facility] 06/29/2021, 1:02 PM

## 2021-06-29 NOTE — Progress Notes (Signed)
[facility] ([facility]) Hospital Liaison note.     This patient is approved to transfer to [facility] today.    [facility] will notify TOC when registration paperwork has been completed to arrange transport.    RN please call report to [phone].   Thank you,     Mary Anne Robertson, RN, CCM       [facility] Hospital Liaison  [phone] 

## 2021-06-29 NOTE — TOC Transition Note (Signed)
Transition of Care [facility]) - CM/SW Discharge Note   Patient Details  Name: Erik Fletcher. MRN: [id number] Date of Birth: June 29, 1950  [facility] CM/SW Contact:  Shanik Brookshire, Marjie Skiff, RN Phone Number: 06/29/2021, 1:24 PM   Clinical Narrative:    Pt to dc to [facility] via [facility] today. Yellow DNR on the chart for transport. RN to call report to [phone].   Final next level of care: Home w Hospice Care Barriers to Discharge: Continued Medical Work up   Patient Goals and CMS Choice   CMS Medicare.gov Compare Post Acute Care list provided to:: Patient Represenative (must comment) (Wife) Choice offered to / list presented to : Spouse  Discharge Plan and Services   Discharge Planning Services: CM Consult Post Acute Care Choice: Hospice           Readmission Risk Interventions No flowsheet data found.

## 2021-06-29 NOTE — Telephone Encounter (Signed)
TC from Tammy R with [facility] Pt being placed in [facility] and requesting Dr Benay Spice as the Attending. Informed Tammy per Dr Benay Spice he will be the attending.

## 2021-06-29 NOTE — Progress Notes (Signed)
Pt discharged to [facility] via [facility] in stable condition. Report called to [facility], RN at facility. PAC left accessed per [facility]

## 2021-07-03 ENCOUNTER — Ambulatory Visit: Payer: Medicare Other | Admitting: Oncology

## 2021-07-25 DEATH — deceased

## 2022-11-11 IMAGING — CT CT CHEST W/ CM
2 of 3 series · 15 of 36 positions shown, 18 images · IV contrast (omnipaque)
Comparison: Chest CT dated 02/27/2021.

CLINICAL DATA: Chest trauma, blunt. Syncopal episode with fall.
Bilateral flank pain and LEFT shoulder pain. Back pain.

EXAM:
CT CHEST WITH CONTRAST
TECHNIQUE: Multidetector CT imaging of the chest was performed during
intravenous contrast administration.
CONTRAST:  60mL OMNIPAQUE IOHEXOL 350 MG/ML SOLN

[Series 3: axial st · axial · 0.78mm/px · z∈[-266,+24]mm · 12 of 171 slices shown, 15 images]
[im 13/171  mediastinal]
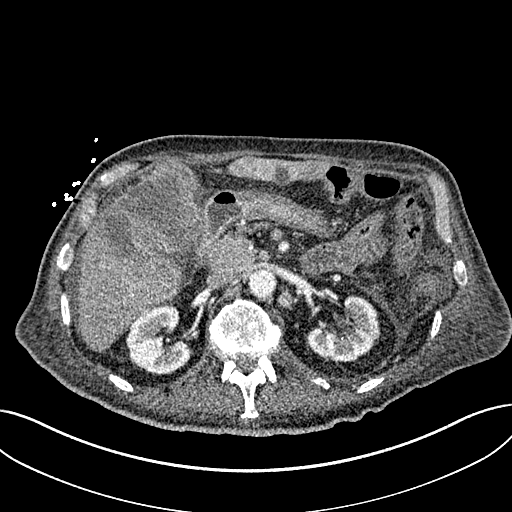
[im 13/171  lung]
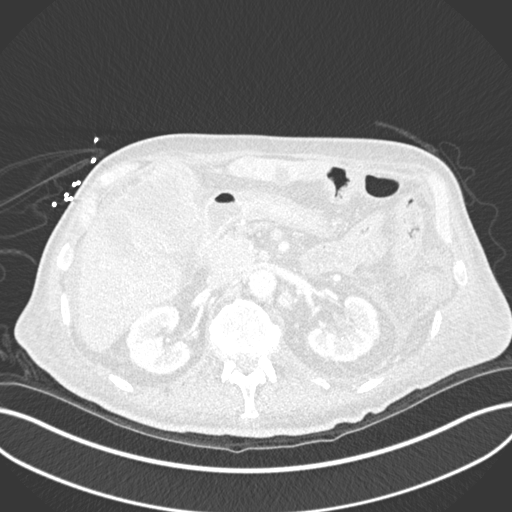
[im 26/171  lung]
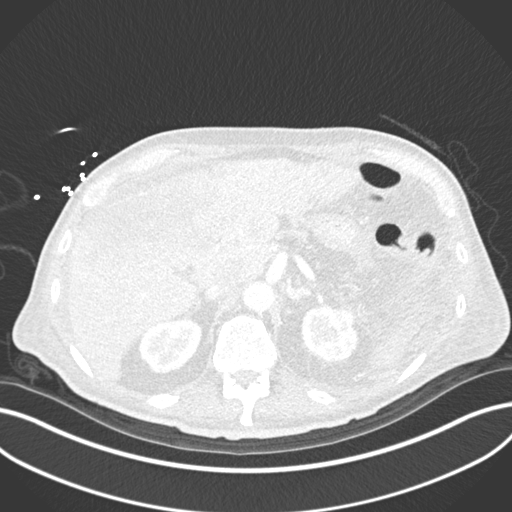
[im 38/171  lung]
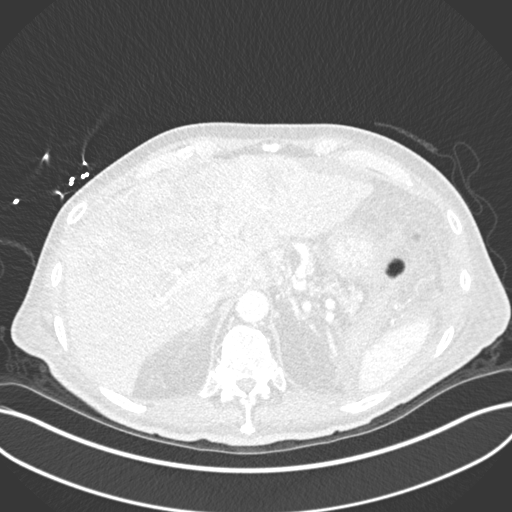
[im 51/171  lung]
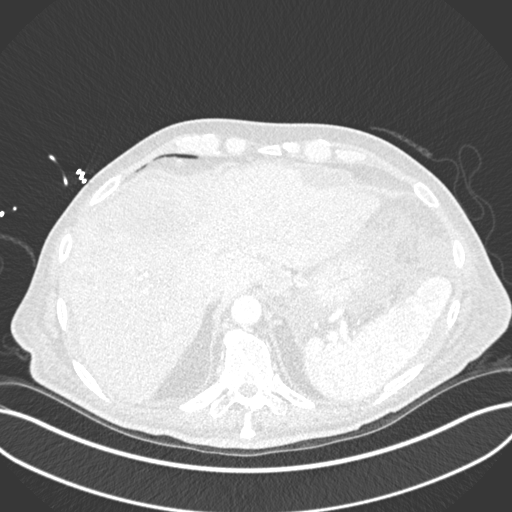
[im 63/171  mediastinal]
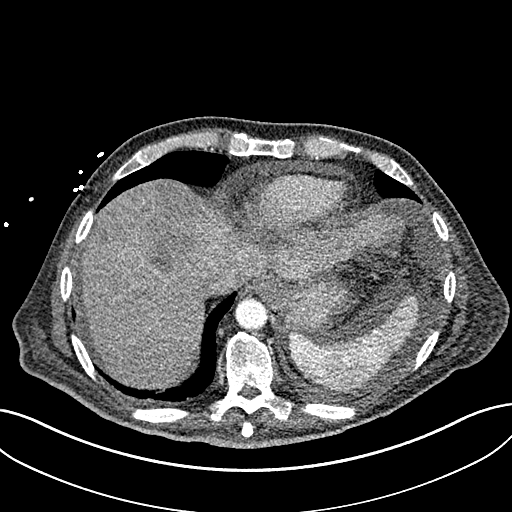
[im 63/171  lung]
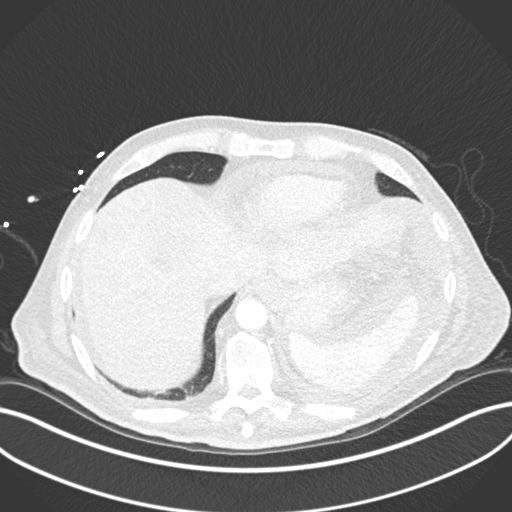
[im 76/171  lung]
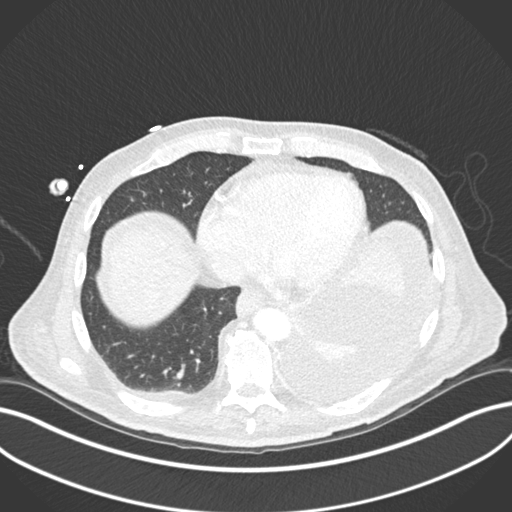
[im 95/171  lung]
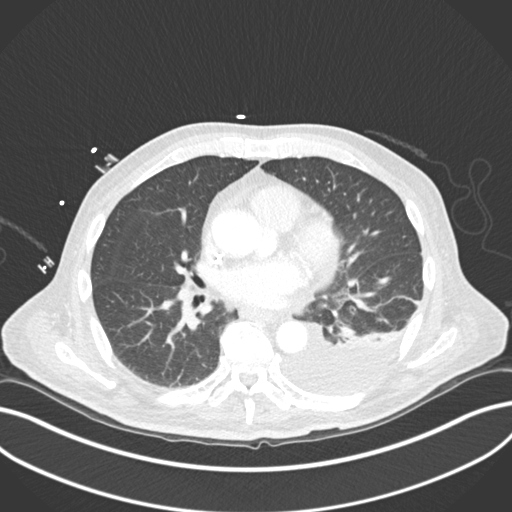
[im 108/171  lung]
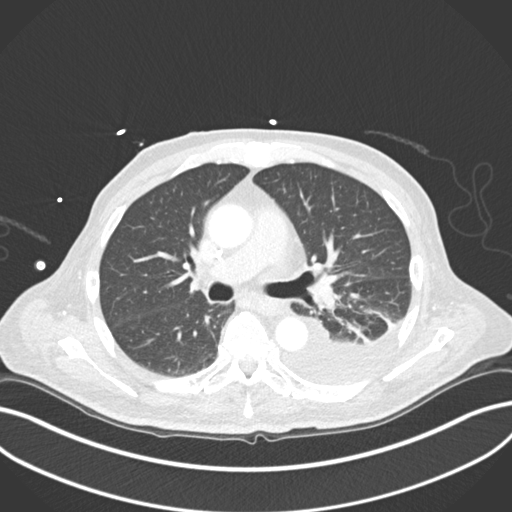
[im 120/171  mediastinal]
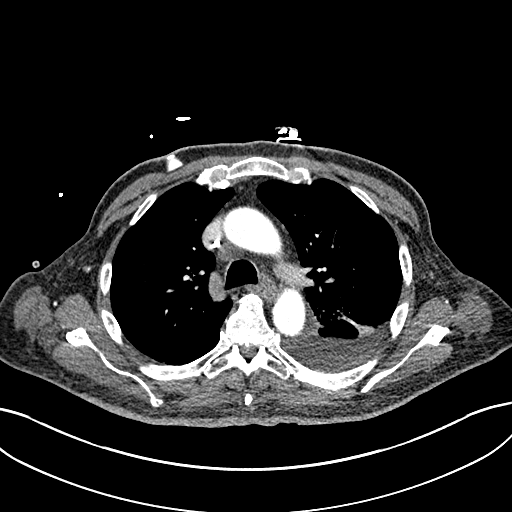
[im 120/171  lung]
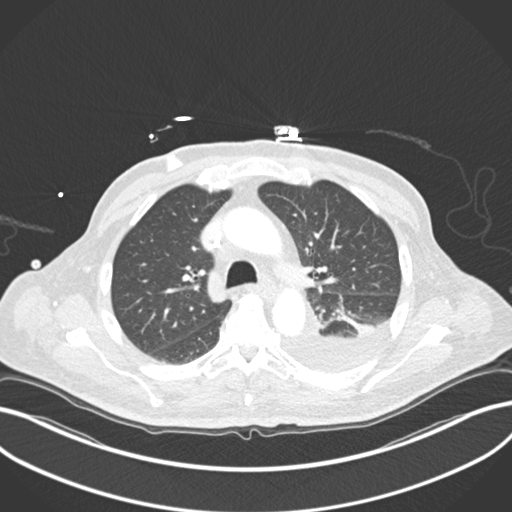
[im 133/171  lung]
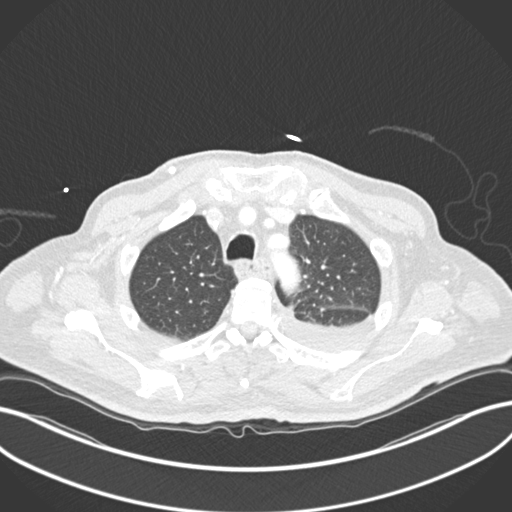
[im 145/171  lung]
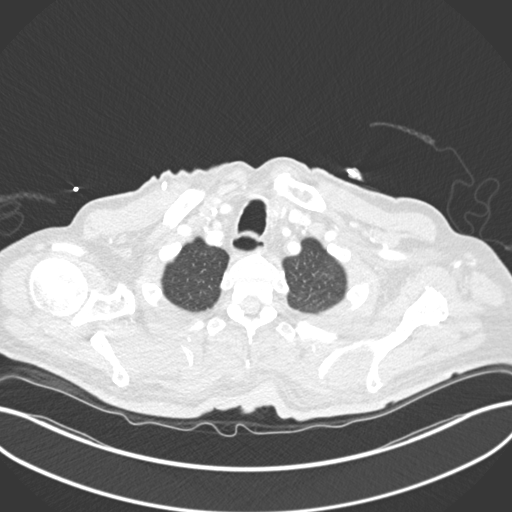
[im 158/171  lung]
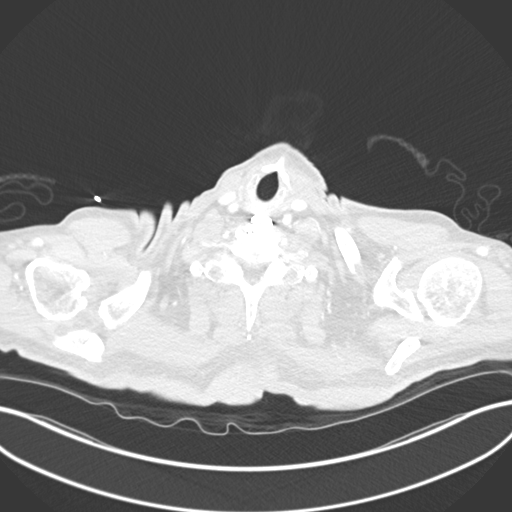

[Series 5: coronal · coronal · 0.70mm/px · 3 of 126 slices shown]
[im 26/126  lung]
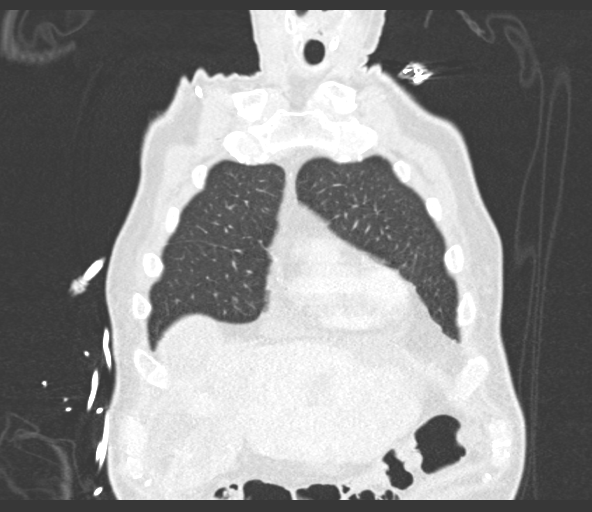
[im 51/126  lung]
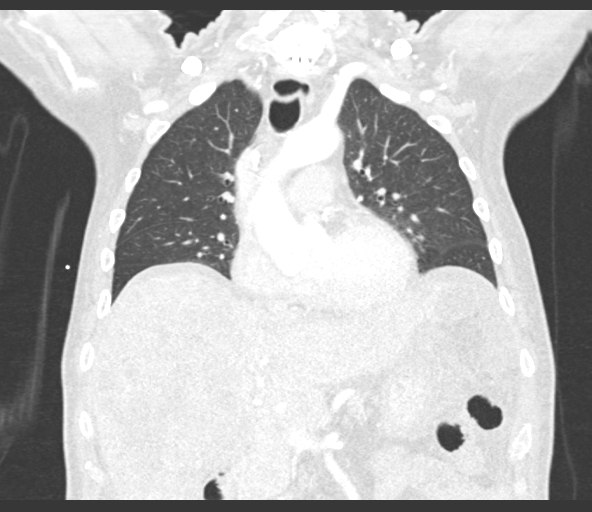
[im 76/126  lung]
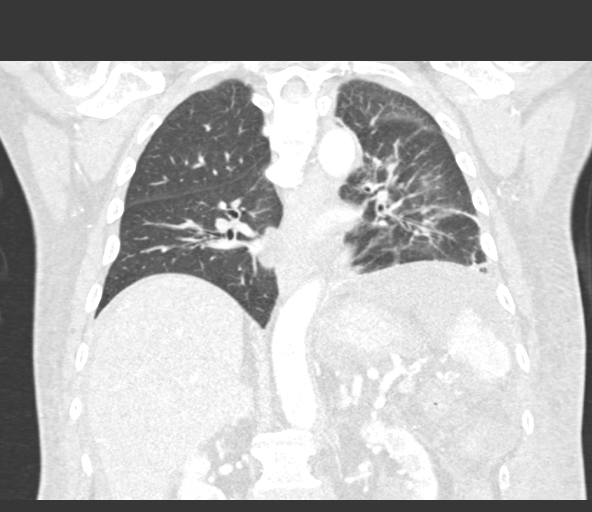

[15 of 36 positions shown; findings below may reference images not displayed]

FINDINGS: Cardiovascular: Thoracic aorta appears intact and stable in
configuration. Scattered atherosclerosis of the thoracic aorta and
coronary arteries. No significant pericardial effusion.

Mediastinum/Nodes: No mass or enlarged lymph nodes are seen within
the mediastinum. Esophagus is somewhat patulous, containing recently
ingested material, but otherwise unremarkable. Trachea and central
bronchi are unremarkable.

Lungs/Pleura: Moderate-sized LEFT pleural effusion with overlying
compressive atelectasis. Mild atelectasis versus pleural thickening
at the RIGHT lung base. No pneumothorax.

Upper Abdomen: Hypodense lesions/masses throughout the liver,
compatible with previously described hepatic metastases. Scattered
areas of free fluid/ascites within the upper abdomen.

Musculoskeletal: Degenerative spondylosis of the kyphotic thoracic
spine, mild to moderate in degree. Anterior cervical fusion hardware
within the lower cervical spine. No acute-appearing osseous
abnormality.
IMPRESSION: 1. Moderate-sized LEFT pleural effusion with overlying compressive
atelectasis, new compared to the earlier chest CT of 02/27/2021.
2. Known hepatic metastases, not significantly changed compared to
CT of 02/27/2021. Also similar appearance of the
periportal/gastrohepatic ligament adenopathy that was also described
on the earlier CT.
3. Scattered free fluid/ascites within the upper abdomen.
4. No acute osseous fracture or dislocation identified.
5. Debris versus recently ingested material within the upper
esophagus. This presents a risk for potential aspiration.

Aortic Atherosclerosis ([id number]).

## 2022-11-12 IMAGING — DX DG CHEST 1V PORT
1 series · 1 of 1 positions shown · non-contrast
Comparison: CT from previous day

CLINICAL DATA: S/P thoracentesis - image obtained bedside in U/S.

EXAM:
PORTABLE CHEST - 1 VIEW

[chest ap]
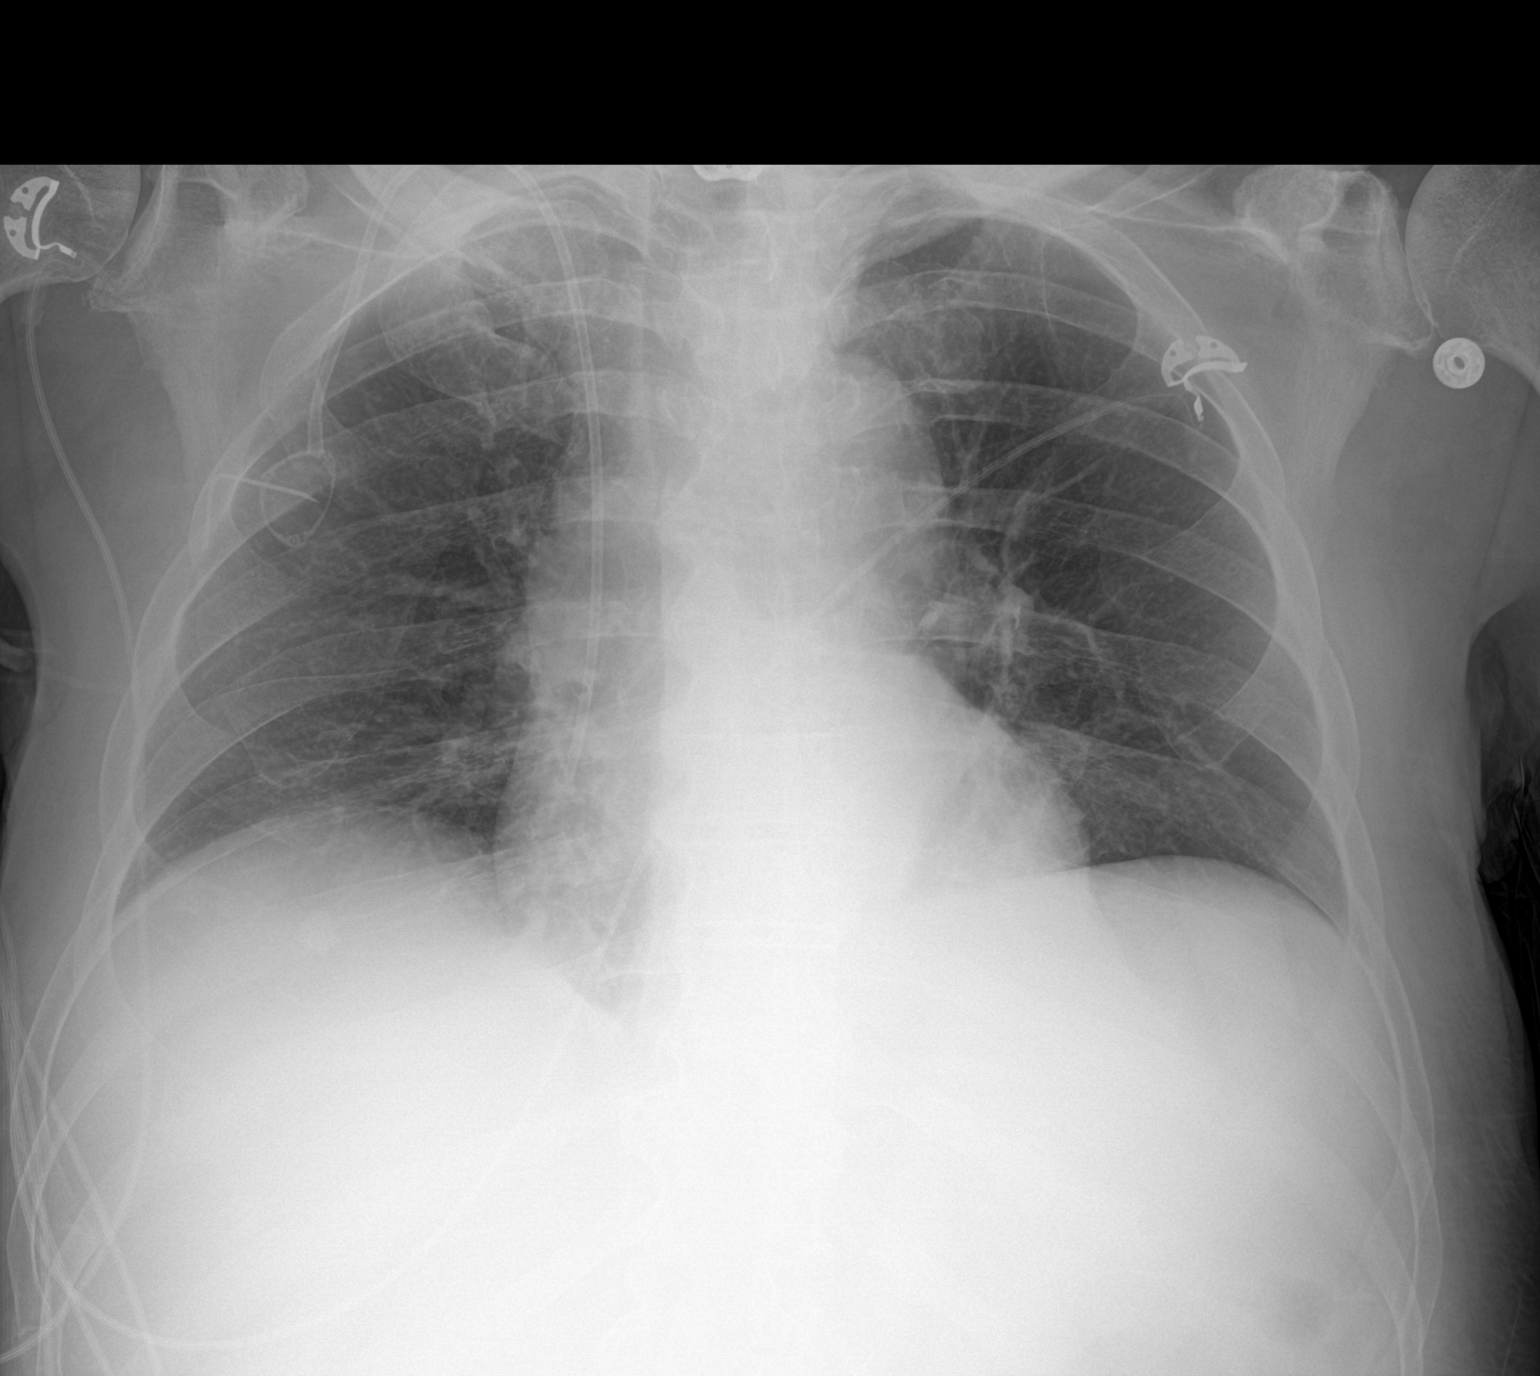

[1 of 1 positions shown; findings below may reference images not displayed]

FINDINGS: No pneumothorax. No significant pleural effusion evident. Residual
patchy left infrahilar atelectasis or infiltrate. Right lung clear.
For stable right IJ power injectable port catheter.

Heart size and mediastinal contours are within normal limits.

Cervical fixation hardware partially visualized.
IMPRESSION: No pneumothorax post thoracentesis.

## 2022-11-12 IMAGING — US US THORACENTESIS ASP PLEURAL SPACE W/IMG GUIDE
1 series · 5 of 5 positions shown · non-contrast
Comparison: none

INDICATION: 70-year-old male referred for diagnostic thoracentesis

[Series 1: us thoracentesis asp pleural s mc & wl · 5 of 5 slices shown]
[im 1/5]
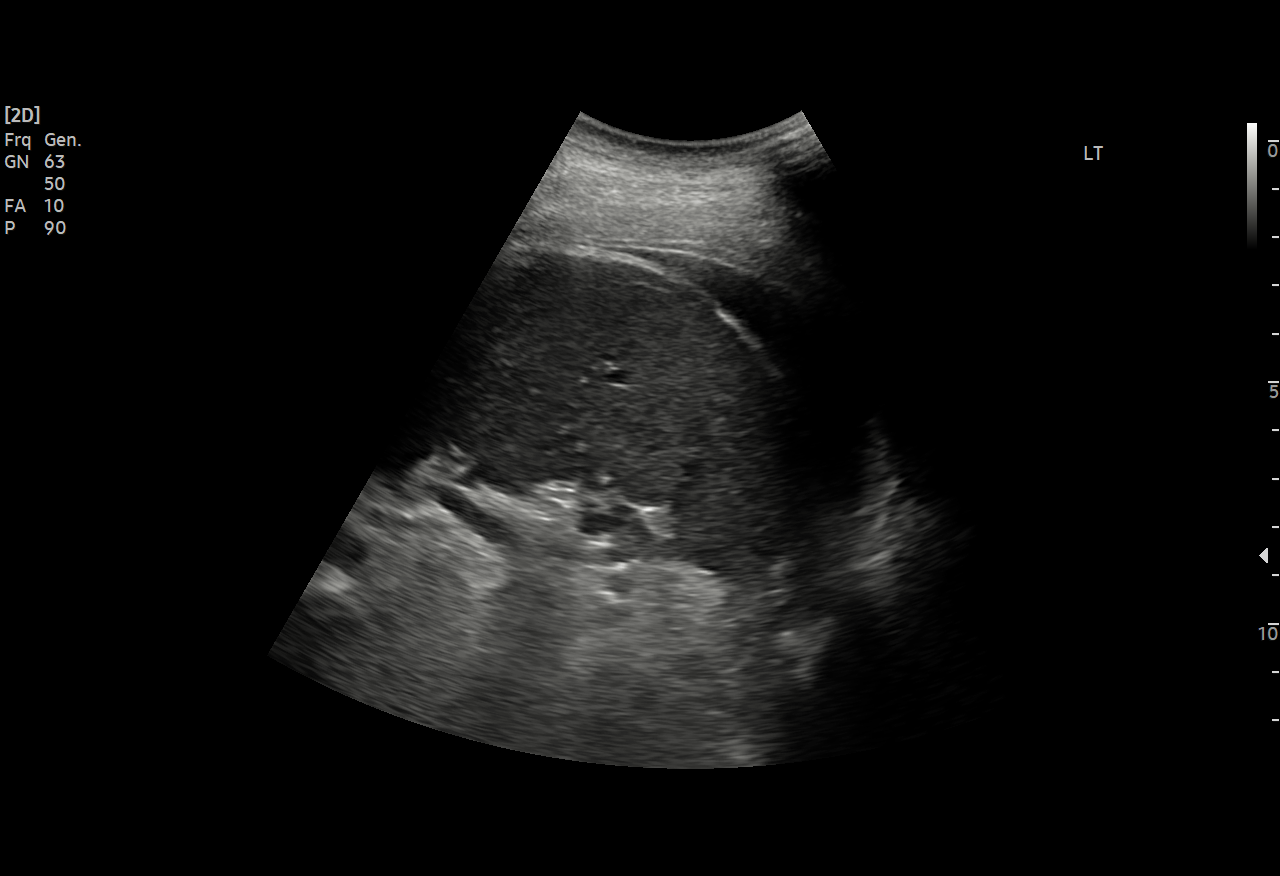
[im 2/5]
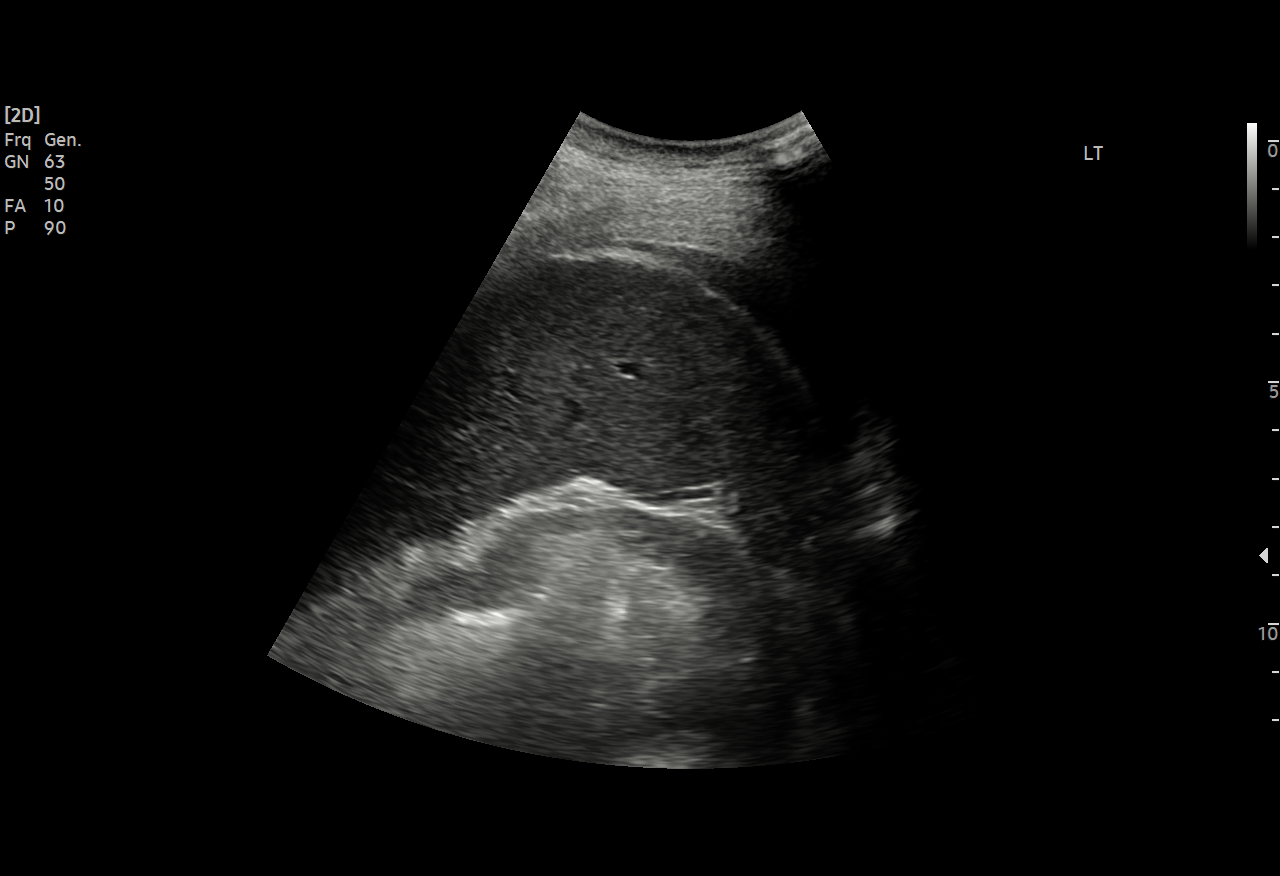
[im 3/5]
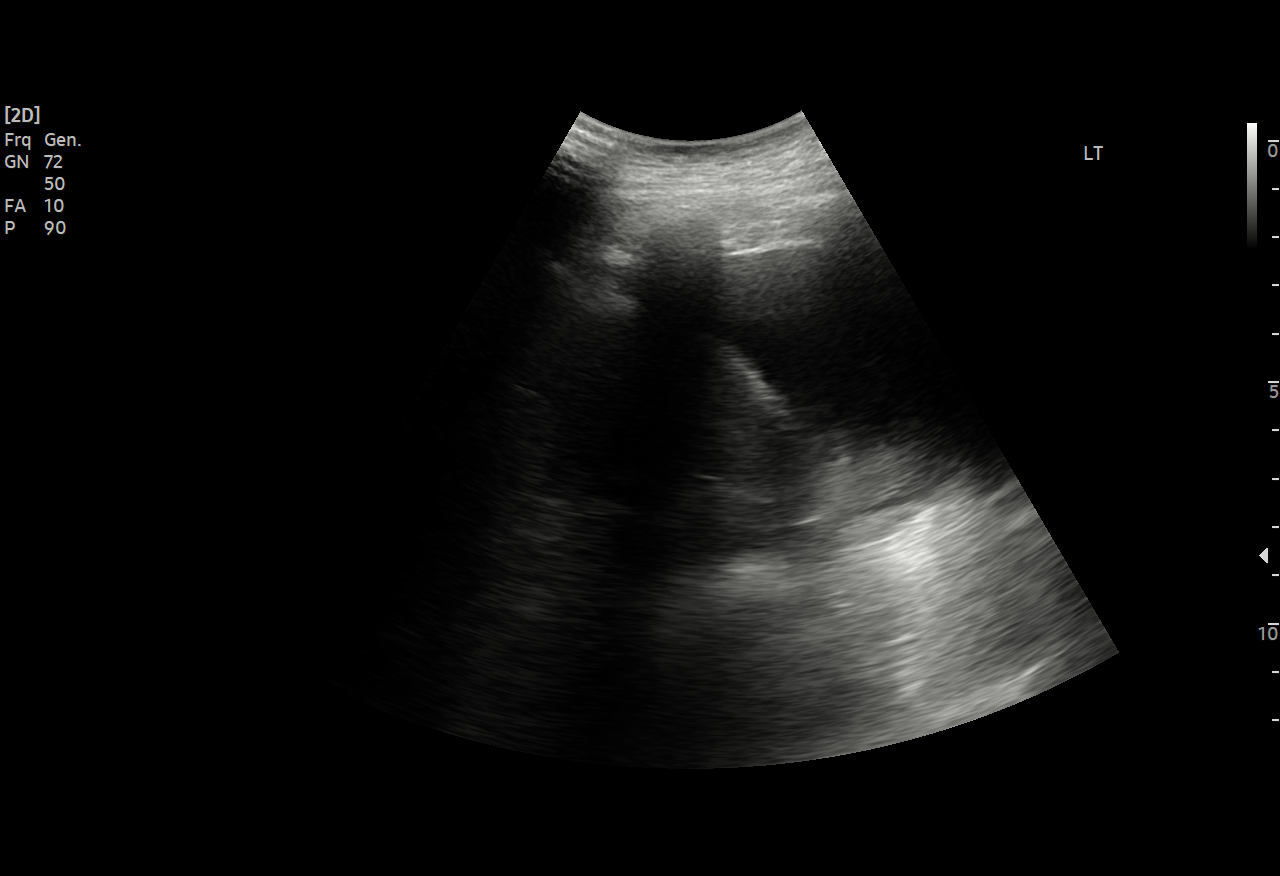
[im 4/5]
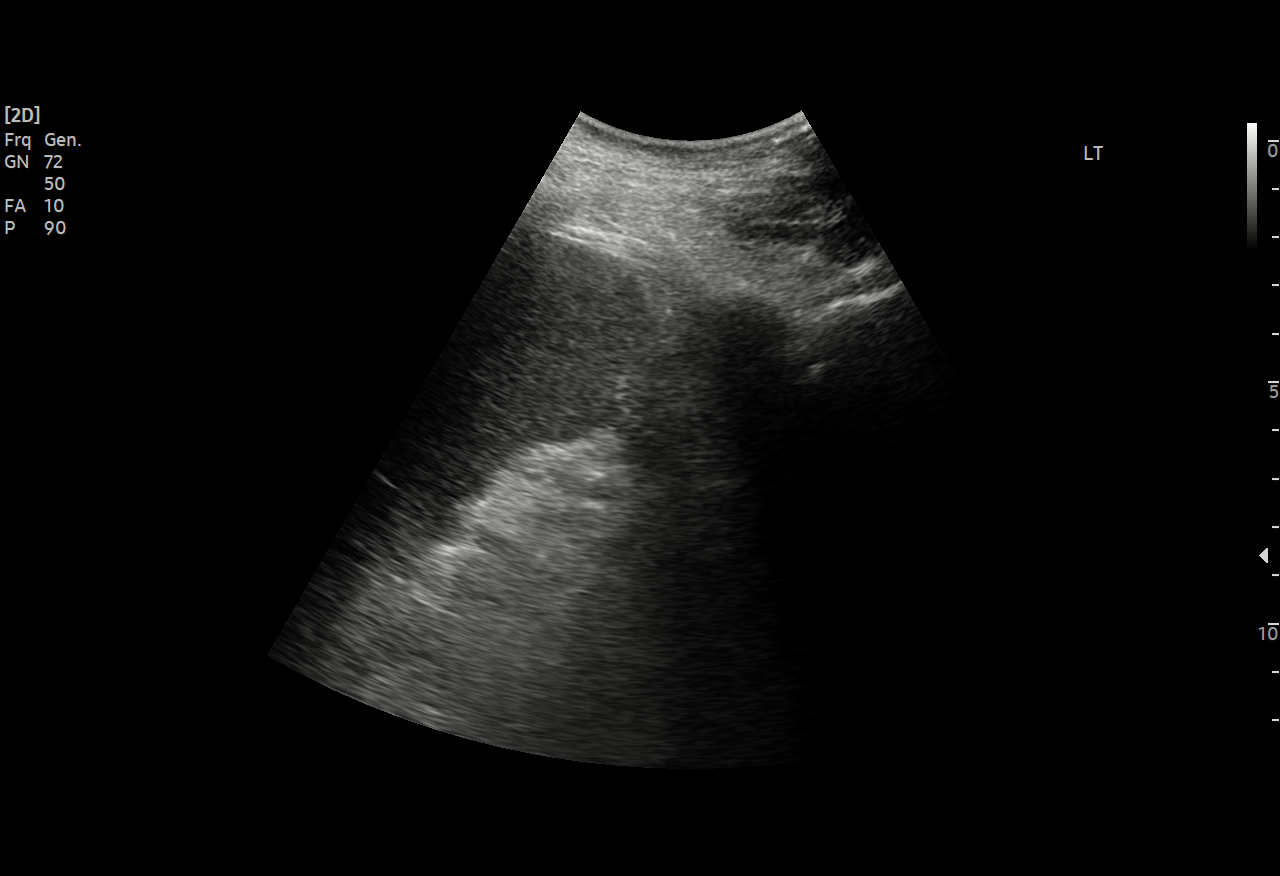
[im 5/5]
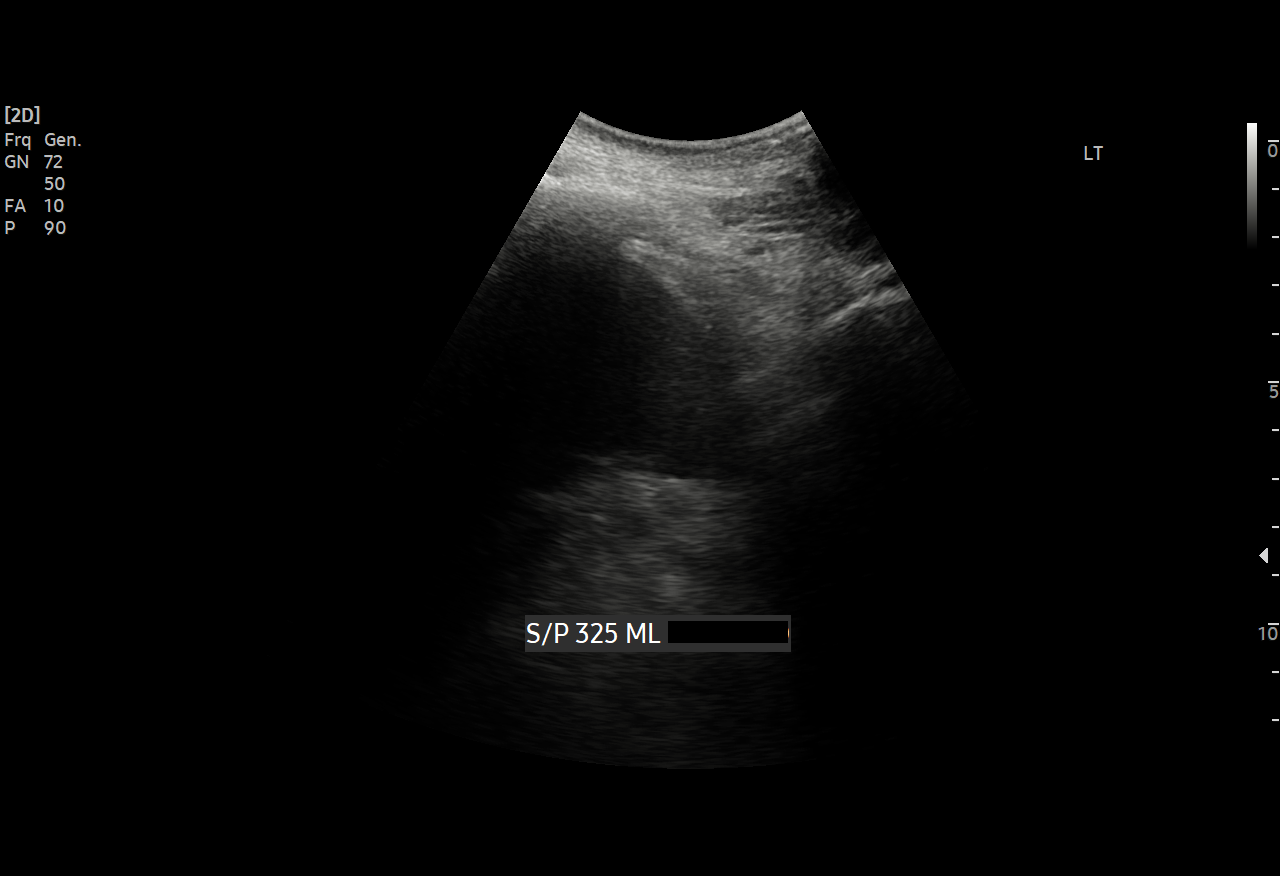

[5 of 5 positions shown; findings below may reference images not displayed]

EXAM:
ULTRASOUND GUIDED LEFT THORACENTESIS

MEDICATIONS:
None.

COMPLICATIONS:
None

PROCEDURE:
An ultrasound guided thoracentesis was thoroughly discussed with the
patient and questions answered. The benefits, risks, alternatives
and complications were also discussed. The patient understands and
wishes to proceed with the procedure. Written consent was obtained.

Ultrasound was performed to localize and mark an adequate pocket of
fluid in the left chest. The area was then prepped and draped in the
normal sterile fashion. 1% Lidocaine was used for local anesthesia.
Under ultrasound guidance a 8 Fr Safe-T-Centesis catheter was
introduced. Thoracentesis was performed. The catheter was removed
and a dressing applied.
FINDINGS: A total of approximately 325 cc of thin amber fluid was removed.
Samples were sent to the laboratory as requested by the clinical
team.
IMPRESSION: Status post ultrasound-guided left thoracentesis.
